# Patient Record
Sex: Male | Born: 1980 | Race: Black or African American | Hispanic: No | Marital: Single | Smoking: Never smoker
Health system: Southern US, Community
[De-identification: ages and names within clinical notes are randomized; demographics above are authoritative.]

## PROBLEM LIST (undated history)

## (undated) DIAGNOSIS — Z6841 Body Mass Index (BMI) 40.0 and over, adult: Secondary | ICD-10-CM

## (undated) DIAGNOSIS — F840 Autistic disorder: Secondary | ICD-10-CM

## (undated) DIAGNOSIS — R4701 Aphasia: Secondary | ICD-10-CM

## (undated) DIAGNOSIS — G4730 Sleep apnea, unspecified: Secondary | ICD-10-CM

## (undated) DIAGNOSIS — I10 Essential (primary) hypertension: Secondary | ICD-10-CM

## (undated) DIAGNOSIS — K0889 Other specified disorders of teeth and supporting structures: Secondary | ICD-10-CM

## (undated) HISTORY — DX: Autistic disorder: F84.0

## (undated) HISTORY — DX: Essential (primary) hypertension: I10

## (undated) HISTORY — PX: TOENAIL EXCISION: SHX183

---
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
## 2002-03-03 ENCOUNTER — Ambulatory Visit (HOSPITAL_BASED_OUTPATIENT_CLINIC_OR_DEPARTMENT_OTHER): Admission: RE | Admit: 2002-03-03 | Discharge: 2002-03-03 | Payer: Self-pay | Admitting: Oral Surgery

## 2002-03-03 ENCOUNTER — Encounter (INDEPENDENT_AMBULATORY_CARE_PROVIDER_SITE_OTHER): Payer: Self-pay | Admitting: Specialist

## 2002-03-03 HISTORY — PX: MULTIPLE TOOTH EXTRACTIONS: SHX2053

## 2002-03-03 HISTORY — PX: CYST EXCISION: SHX5701

## 2003-05-05 ENCOUNTER — Encounter: Payer: Self-pay | Admitting: Family Medicine

## 2003-05-05 ENCOUNTER — Ambulatory Visit (HOSPITAL_COMMUNITY): Admission: RE | Admit: 2003-05-05 | Discharge: 2003-05-05 | Payer: Self-pay | Admitting: Family Medicine

## 2004-07-27 ENCOUNTER — Ambulatory Visit: Payer: Self-pay | Admitting: Family Medicine

## 2005-02-01 ENCOUNTER — Ambulatory Visit: Payer: Self-pay | Admitting: Family Medicine

## 2005-05-22 ENCOUNTER — Ambulatory Visit: Payer: Self-pay | Admitting: Family Medicine

## 2006-01-28 ENCOUNTER — Ambulatory Visit: Payer: Self-pay | Admitting: Family Medicine

## 2006-04-09 ENCOUNTER — Ambulatory Visit: Payer: Self-pay | Admitting: Family Medicine

## 2006-05-29 ENCOUNTER — Ambulatory Visit: Payer: Self-pay | Admitting: Family Medicine

## 2006-12-16 ENCOUNTER — Ambulatory Visit: Payer: Self-pay | Admitting: Family Medicine

## 2006-12-27 ENCOUNTER — Encounter: Payer: Self-pay | Admitting: Family Medicine

## 2006-12-27 LAB — CONVERTED CEMR LAB
BUN: 15 mg/dL (ref 6–23)
Calcium: 9.2 mg/dL (ref 8.4–10.5)
Chloride: 103 meq/L (ref 96–112)
Cholesterol: 164 mg/dL (ref 0–200)
Creatinine, Ser: 0.92 mg/dL (ref 0.40–1.50)
Glucose, Bld: 74 mg/dL (ref 70–99)
HDL: 41 mg/dL (ref >39)
LDL Cholesterol: 106 mg/dL — ABNORMAL HIGH (ref 0–99)
TSH: 1.554 microintl units/mL (ref 0.350–5.50)
Total CHOL/HDL Ratio: 4
VLDL: 17 mg/dL (ref 0–40)

## 2007-05-07 ENCOUNTER — Ambulatory Visit: Payer: Self-pay | Admitting: Family Medicine

## 2007-08-07 ENCOUNTER — Encounter: Payer: Self-pay | Admitting: Family Medicine

## 2007-10-13 ENCOUNTER — Ambulatory Visit: Payer: Self-pay | Admitting: Family Medicine

## 2007-10-31 DIAGNOSIS — F79 Unspecified intellectual disabilities: Secondary | ICD-10-CM | POA: Insufficient documentation

## 2007-10-31 DIAGNOSIS — B351 Tinea unguium: Secondary | ICD-10-CM | POA: Insufficient documentation

## 2008-02-12 ENCOUNTER — Ambulatory Visit: Payer: Self-pay | Admitting: Family Medicine

## 2008-03-31 ENCOUNTER — Encounter: Payer: Self-pay | Admitting: Family Medicine

## 2008-04-13 ENCOUNTER — Encounter: Payer: Self-pay | Admitting: Family Medicine

## 2008-04-13 ENCOUNTER — Telehealth: Payer: Self-pay | Admitting: Family Medicine

## 2008-04-16 ENCOUNTER — Encounter: Payer: Self-pay | Admitting: Family Medicine

## 2008-04-16 LAB — CONVERTED CEMR LAB
Cholesterol: 146 mg/dL (ref 0–200)
Eosinophils Relative: 2 % (ref 0–5)
Lymphocytes Relative: 11 % — ABNORMAL LOW (ref 12–46)
Lymphs Abs: 1.4 10*3/uL (ref 0.7–4.0)
MCV: 92.2 fL (ref 78.0–100.0)
Monocytes Relative: 7 % (ref 3–12)
Neutro Abs: 10.5 10*3/uL — ABNORMAL HIGH (ref 1.7–7.7)
Platelets: 318 10*3/uL (ref 150–400)
RDW: 12.7 % (ref 11.5–15.5)
Total CHOL/HDL Ratio: 3.3
VLDL: 20 mg/dL (ref 0–40)
WBC: 13.1 10*3/uL — ABNORMAL HIGH (ref 4.0–10.5)

## 2008-06-03 ENCOUNTER — Ambulatory Visit: Payer: Self-pay | Admitting: Family Medicine

## 2008-08-23 ENCOUNTER — Ambulatory Visit: Payer: Self-pay | Admitting: Family Medicine

## 2008-08-23 ENCOUNTER — Ambulatory Visit (HOSPITAL_COMMUNITY): Admission: RE | Admit: 2008-08-23 | Discharge: 2008-08-23 | Payer: Self-pay | Admitting: Family Medicine

## 2008-09-20 ENCOUNTER — Encounter: Payer: Self-pay | Admitting: Family Medicine

## 2008-10-04 ENCOUNTER — Ambulatory Visit: Payer: Self-pay | Admitting: Family Medicine

## 2008-10-27 ENCOUNTER — Ambulatory Visit (HOSPITAL_COMMUNITY): Admission: RE | Admit: 2008-10-27 | Discharge: 2008-10-27 | Payer: Self-pay | Admitting: Family Medicine

## 2008-10-27 ENCOUNTER — Ambulatory Visit: Payer: Self-pay | Admitting: Family Medicine

## 2008-11-16 ENCOUNTER — Encounter: Payer: Self-pay | Admitting: Family Medicine

## 2008-12-27 ENCOUNTER — Ambulatory Visit: Payer: Self-pay | Admitting: Family Medicine

## 2008-12-27 DIAGNOSIS — B350 Tinea barbae and tinea capitis: Secondary | ICD-10-CM | POA: Insufficient documentation

## 2009-04-28 ENCOUNTER — Encounter: Payer: Self-pay | Admitting: Family Medicine

## 2009-04-28 LAB — CONVERTED CEMR LAB
BUN: 13 mg/dL (ref 6–23)
Basophils Absolute: 0.1 10*3/uL (ref 0.0–0.1)
Chloride: 105 meq/L (ref 96–112)
Cholesterol: 155 mg/dL (ref 0–200)
Eosinophils Absolute: 0.5 10*3/uL (ref 0.0–0.7)
Eosinophils Relative: 4 % (ref 0–5)
Glucose, Bld: 83 mg/dL (ref 70–99)
HCT: 45.6 % (ref 39.0–52.0)
HDL: 39 mg/dL — ABNORMAL LOW (ref >39)
LDL Cholesterol: 98 mg/dL (ref 0–99)
Lymphocytes Relative: 10 % — ABNORMAL LOW (ref 12–46)
Lymphs Abs: 1.2 10*3/uL (ref 0.7–4.0)
MCHC: 33.1 g/dL (ref 30.0–36.0)
Neutro Abs: 8.8 10*3/uL — ABNORMAL HIGH (ref 1.7–7.7)
Neutrophils Relative %: 79 % — ABNORMAL HIGH (ref 43–77)
Platelets: 285 10*3/uL (ref 150–400)
VLDL: 18 mg/dL (ref 0–40)

## 2009-05-02 ENCOUNTER — Ambulatory Visit: Payer: Self-pay | Admitting: Family Medicine

## 2009-09-03 IMAGING — CR DG CHEST 2V
2 series · 2 of 2 positions shown · non-contrast
Comparison: None available.

CLINICAL DATA: Acute bronchitis.

CHEST - 2 VIEW

[view not recorded (1 of 2)]
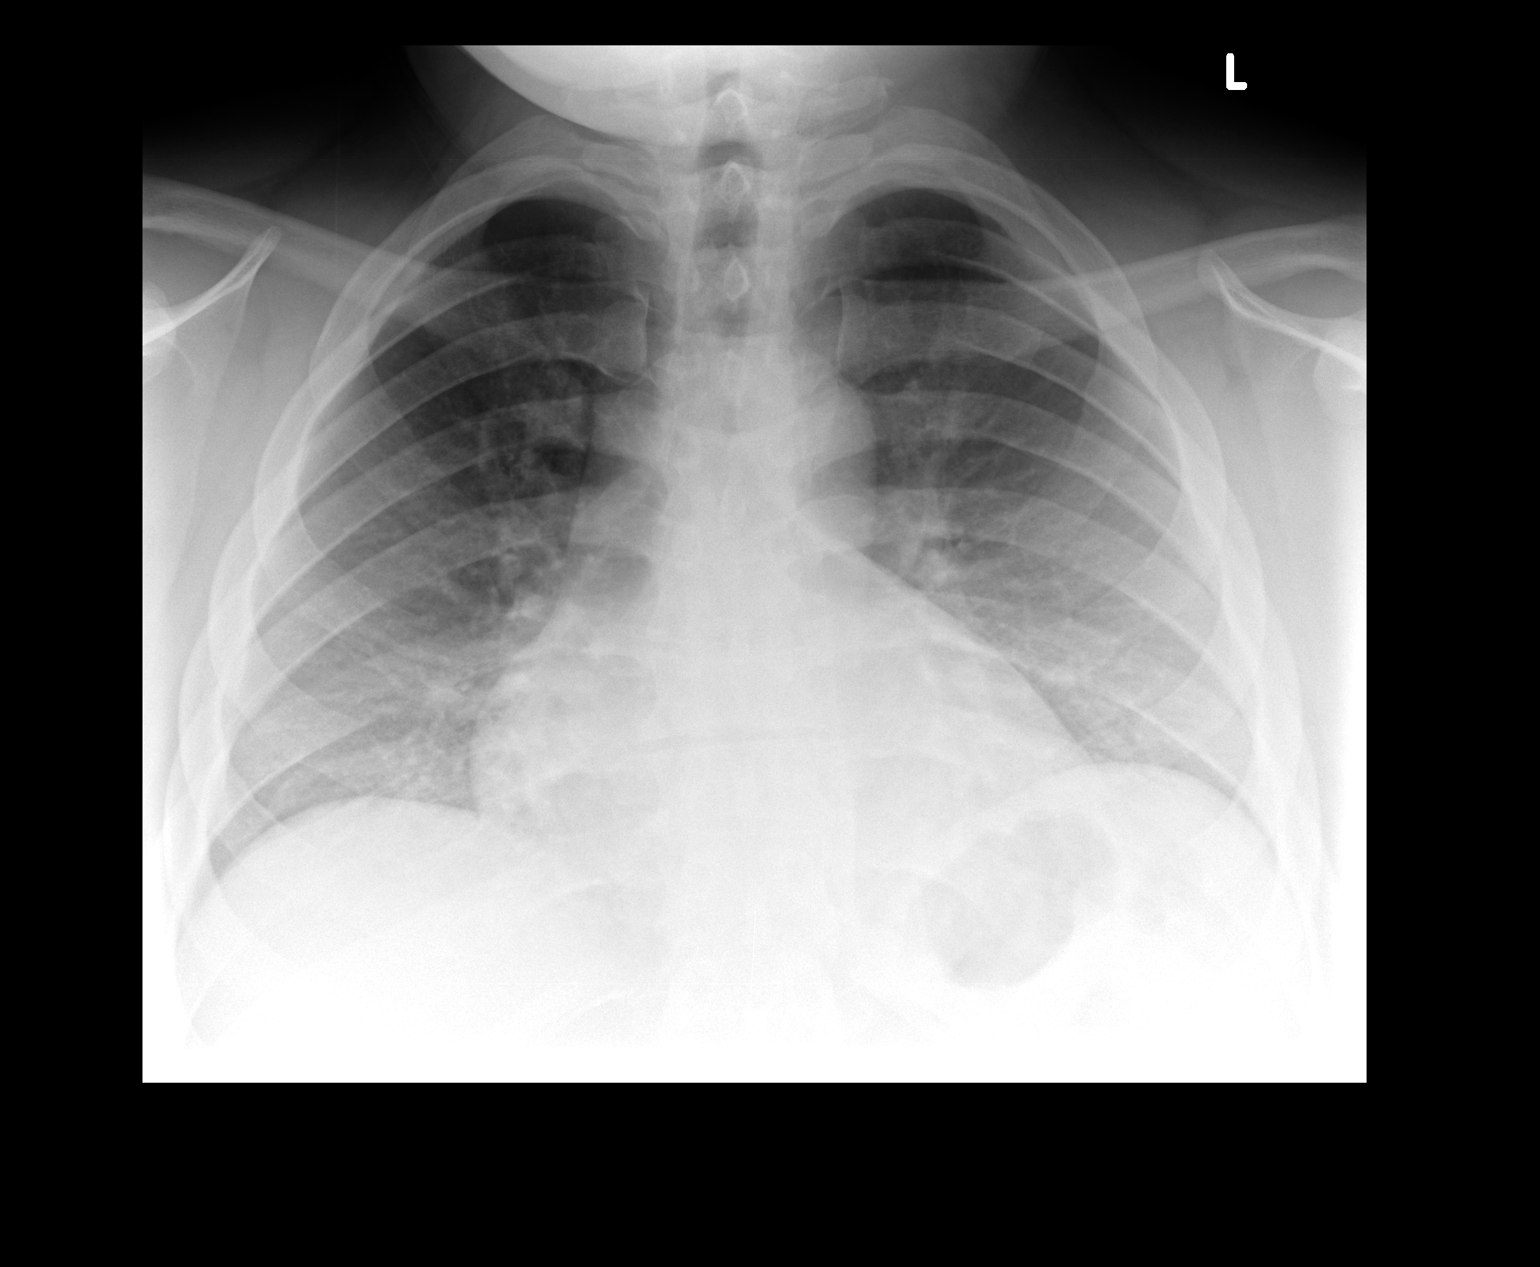

[view not recorded (2 of 2)]
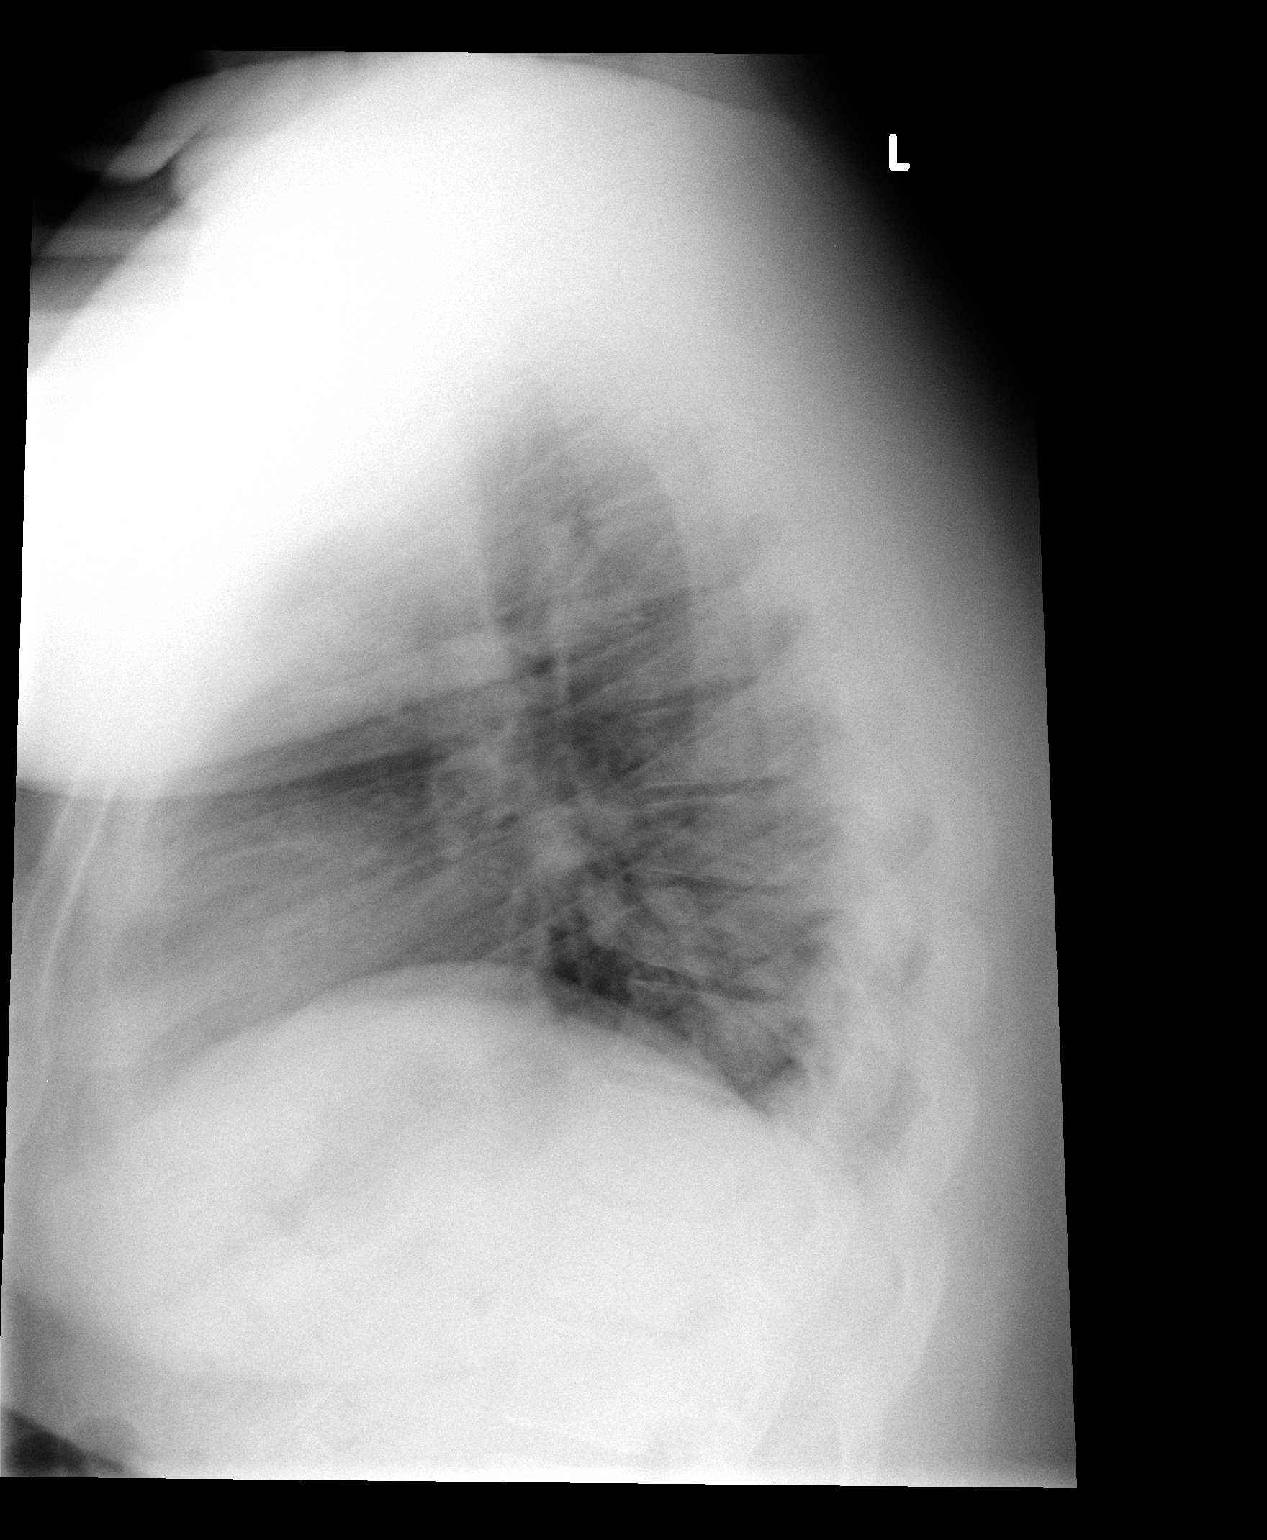

[2 of 2 positions shown; findings below may reference images not displayed]

FINDINGS: Lung volumes are low with some crowding of the
bronchovascular structures.  There is no focal airspace disease.
Heart size is normal.  No pleural effusion.
IMPRESSION: No acute finding with low lung volumes noted.

## 2009-10-11 ENCOUNTER — Ambulatory Visit: Payer: Self-pay | Admitting: Family Medicine

## 2009-10-11 DIAGNOSIS — I10 Essential (primary) hypertension: Secondary | ICD-10-CM | POA: Insufficient documentation

## 2009-11-16 LAB — CONVERTED CEMR LAB
BUN: 21 mg/dL (ref 6–23)
Basophils Absolute: 0.1 10*3/uL (ref 0.0–0.1)
Basophils Relative: 1 % (ref 0–1)
Chloride: 101 meq/L (ref 96–112)
Creatinine, Ser: 0.8 mg/dL (ref 0.40–1.50)
Eosinophils Absolute: 0.2 10*3/uL (ref 0.0–0.7)
Eosinophils Relative: 2 % (ref 0–5)
Hemoglobin: 15.4 g/dL (ref 13.0–17.0)
Lymphocytes Relative: 15 % (ref 12–46)
Neutro Abs: 7.5 10*3/uL (ref 1.7–7.7)
Neutrophils Relative %: 76 % (ref 43–77)
Potassium: 4.1 meq/L (ref 3.5–5.3)
Sodium: 142 meq/L (ref 135–145)
WBC: 9.8 10*3/uL (ref 4.0–10.5)

## 2009-11-18 ENCOUNTER — Telehealth: Payer: Self-pay | Admitting: Family Medicine

## 2009-11-18 ENCOUNTER — Ambulatory Visit (HOSPITAL_COMMUNITY): Admission: RE | Admit: 2009-11-18 | Discharge: 2009-11-18 | Payer: Self-pay | Admitting: Family Medicine

## 2009-11-18 ENCOUNTER — Ambulatory Visit: Payer: Self-pay | Admitting: Family Medicine

## 2009-11-18 DIAGNOSIS — J302 Other seasonal allergic rhinitis: Secondary | ICD-10-CM | POA: Insufficient documentation

## 2009-11-20 DIAGNOSIS — J209 Acute bronchitis, unspecified: Secondary | ICD-10-CM

## 2010-04-17 ENCOUNTER — Ambulatory Visit: Payer: Self-pay | Admitting: Family Medicine

## 2010-04-17 DIAGNOSIS — R5381 Other malaise: Secondary | ICD-10-CM

## 2010-04-17 DIAGNOSIS — R5383 Other fatigue: Secondary | ICD-10-CM

## 2010-05-25 LAB — CONVERTED CEMR LAB
Chloride: 107 meq/L (ref 96–112)
Creatinine, Ser: 0.9 mg/dL (ref 0.40–1.50)
HDL: 34 mg/dL — ABNORMAL LOW (ref >39)
LDL Cholesterol: 73 mg/dL (ref 0–99)
Potassium: 4.4 meq/L (ref 3.5–5.3)
Sodium: 143 meq/L (ref 135–145)
Triglycerides: 168 mg/dL — ABNORMAL HIGH (ref <150)

## 2010-08-17 ENCOUNTER — Ambulatory Visit
Admission: RE | Admit: 2010-08-17 | Discharge: 2010-08-17 | Payer: Self-pay | Source: Home / Self Care | Attending: Family Medicine | Admitting: Family Medicine

## 2010-08-17 DIAGNOSIS — R7303 Prediabetes: Secondary | ICD-10-CM | POA: Insufficient documentation

## 2010-08-17 DIAGNOSIS — E739 Lactose intolerance, unspecified: Secondary | ICD-10-CM | POA: Insufficient documentation

## 2010-08-17 DIAGNOSIS — R7301 Impaired fasting glucose: Secondary | ICD-10-CM | POA: Insufficient documentation

## 2010-08-18 LAB — CONVERTED CEMR LAB
BUN: 12 mg/dL (ref 6–23)
Glucose, Bld: 85 mg/dL (ref 70–99)
Hgb A1c MFr Bld: 5.6 % (ref <5.7)
Potassium: 4.1 meq/L (ref 3.5–5.3)

## 2010-09-05 NOTE — Letter (Signed)
Summary: xray  xray   Imported By: Curtis Sites 01/25/2010 11:41:12  _____________________________________________________________________  External Attachment:    Type:   Image     Comment:   External Document

## 2010-09-05 NOTE — Assessment & Plan Note (Signed)
Summary: office visit   Vital Signs:  Patient profile:   30 year old male Height:      72 inches Weight:      360.50 pounds BMI:     49.07 O2 Sat:      98 % Pulse rate:   112 / minute Pulse rhythm:   regular Resp:     16 per minute BP sitting:   140 / 90  (left arm) Cuff size:   xl  Vitals Entered By: Everitt Amber LPN (October 11, 9145 9:35 AM)  Nutrition Counseling: Patient's BMI is greater than 25 and therefore counseled on weight management options. CC: Follow up chronic problems, had a cold and still has a little dry cough   CC:  Follow up chronic problems and had a cold and still has a little dry cough.  History of Present Illness: 2 week h/o head and chest congestion with cough , fatigue and reduced apetite in the past 3 days.  Pt noted to be picking his fingers.excessively with resultant skin breakdown. HeaLSO IS EARTING WITH NO RESTRAINT AND UNFORTUNATELY CONTINUES TO GAIN WEIGHT. He has no h/o excessive nasal drainage , sneezing or sore throat.   Current Medications (verified): 1)  Singulair 10 Mg Tabs (Montelukast Sodium) .... One Tab By Mouth Qd  Allergies (verified): No Known Drug Allergies  Review of Systems      See HPI Eyes:  Denies blurring and red eye. CV:  Denies difficulty breathing while lying down, shortness of breath with exertion, and swelling of feet. GI:  Denies abdominal pain, constipation, diarrhea, nausea, and vomiting. GU:  Denies incontinence, urinary frequency, and urinary hesitancy. MS:  Denies joint pain and stiffness. Heme:  Denies abnormal bruising and bleeding. Allergy:  Complains of seasonal allergies.  Physical Exam  General:  alert, well-hydrated, morbidly obese HEENT: No facial asymmetry,  EOMI, No sinus tenderness, TM's Clear, oropharynx  pink and moist. .  Chest:decreased air entry with bilateral crackles. and a few wheezes  CVS: S1, S2, No murmurs, No S3.   Abd: Soft, Nontender.  MS: Adequate ROM spine, hips, shoulders  and knees.  Ext: No edema.   CNS: CN 2-12 intact, power tone and sensation normal throughout.   Skin: Intact, no visible lesions or rashes.  Psych: Good eye contact, normal affect.        Impression & Recommendations:  Problem # 1:  HYPERTENSION (ICD-401.9) Assessment [name]  His updated medication list for this problem includes:    Hydrochlorothiazide 25 Mg Tabs (Hydrochlorothiazide) .Marland Kitchen... Take 1 tablet by mouth once a day  Orders: T-Basic Metabolic Panel [phone])  BP today: 140/90 Prior BP: 120/80 (05/02/2009)  Labs Reviewed: K+: 4.1 (04/28/2009) Creat: : 0.92 (04/28/2009)   Chol: 155 (04/28/2009)   HDL: 39 (04/28/2009)   LDL: 98 (04/28/2009)   TG: 91 (04/28/2009)  Problem # 2:  ACUTE BRONCHITIS (ICD-466.0) Assessment: Comment Only  The following medications were removed from the medication list:    Penicillin V Potassium 500 Mg Tabs (Penicillin v potassium) .Marland Kitchen... Take 1 tablet by mouth three times a day    Tessalon Perles 100 Mg Caps (Benzonatate) .Marland Kitchen... Take 1 capsule by mouth three times a day His updated medication list for this problem includes:    Singulair 10 Mg Tabs (Montelukast sodium) ..... One tab by mouth qd    Septra Ds 800-160 Mg Tabs (Sulfamethoxazole-trimethoprim) .Marland Kitchen... Take 1 tablet by mouth two times a day    Tessalon Perles 100 Mg Caps (Benzonatate) .Marland KitchenMarland KitchenMarland KitchenMarland Kitchen  Take 1 capsule by mouth three times a day  Problem # 3:  OBESITY, UNSPECIFIED (ICD-278.00) Assessment: [name]  Ht: 72 (10/11/2009)   Wt: 360.50 (10/11/2009)   BMI: 49.07 (10/11/2009)  Complete Medication List: 1)  Singulair 10 Mg Tabs (Montelukast sodium) .... One tab by mouth qd 2)  Septra Ds 800-160 Mg Tabs (Sulfamethoxazole-trimethoprim) .... Take 1 tablet by mouth two times a day 3)  Tessalon Perles 100 Mg Caps (Benzonatate) .... Take 1 capsule by mouth three times a day 4)  Hydrochlorothiazide 25 Mg Tabs (Hydrochlorothiazide) .... Take 1 tablet by mouth once a day 5)  Klor-con  M20 20 Meq Cr-tabs (Potassium chloride crys cr) .... Take 1 tablet by mouth once a day  Other Orders: T-CBC w/Diff ([id number])  Patient Instructions: 1)  Please schedule a follow-up appointment in  5 to 6 weeks. 2)  You are being treated for bronchitis an meds are sent in. 3)  You will need to start medication for blood p[ressure again. 4)  It is extremely impt that you eat more fruit andveg, drink only water or crystal light, and eatsmaller food portions. Prescriptions: KLOR-CON M20 20 MEQ CR-TABS (POTASSIUM CHLORIDE CRYS CR) Take 1 tablet by mouth once a day  #30 x 2   Entered and Authorized by:   Syliva Overman MD   Signed by:   Syliva Overman MD on 10/11/2009   Method used:   Electronically to        [facility] (retail)       [address] [place], [place]  [address]       Ph: [phone]       Fax: [phone]   RxID:   [id number] HYDROCHLOROTHIAZIDE 25 MG TABS (HYDROCHLOROTHIAZIDE) Take 1 tablet by mouth once a day  #30 x 2   Entered and Authorized by:   Syliva Overman MD   Signed by:   Syliva Overman MD on 10/11/2009   Method used:   Electronically to        [facility] (retail)       [address]       [place], [place]  [address]       Ph: [phone]       Fax: [phone]   RxID:   [id number] TESSALON PERLES 100 MG CAPS (BENZONATATE) Take 1 capsule by mouth three times a day  #30 x 0   Entered and Authorized by:   Syliva Overman MD   Signed by:   Syliva Overman MD on 10/11/2009   Method used:   Electronically to        [facility] (retail)       [address]       [place], [place]  [address]       Ph: [phone]       Fax: [phone]   RxID:   [id number] SEPTRA DS 800-160 MG TABS (SULFAMETHOXAZOLE-TRIMETHOPRIM) Take 1 tablet by mouth two times a day  #20 x 0   Entered and Authorized by:   Syliva Overman MD    Signed by:   Syliva Overman MD on 10/11/2009   Method used:   Electronically to        [facility] (retail)       [address]  [place], [place]  [address]       Ph: [phone]       Fax: [phone]   RxID:   [id number]

## 2010-09-05 NOTE — Letter (Signed)
Summary: demographic  demographic   Imported By: Curtis Sites 01/25/2010 11:39:10  _____________________________________________________________________  External Attachment:    Type:   Image     Comment:   External Document

## 2010-09-05 NOTE — Letter (Signed)
Summary: phone  phone   Imported By: Curtis Sites 01/25/2010 11:40:14  _____________________________________________________________________  External Attachment:    Type:   Image     Comment:   External Document

## 2010-09-05 NOTE — Assessment & Plan Note (Signed)
Summary: F UP   Vital Signs:  Patient profile:   30 year old male Height:      72 inches Weight:      356 pounds BMI:     48.46 O2 Sat:      95 % Pulse rate:   101 / minute Pulse rhythm:   regular Resp:     16 per minute BP sitting:   120 / 84  (right arm)  Vitals Entered By: Everitt Amber LPN (November 18, 2009 8:57 AM)  Nutrition Counseling: Patient's BMI is greater than 25 and therefore counseled on weight management options. CC: Follow up chronic problems, still has some sinus drainage, clear   CC:  Follow up chronic problems, still has some sinus drainage, and clear.  History of Present Illness: 5 day h/o head and chest songestion with increased clear nasal drainage ,and cough. There is no h/o fever or chills. The pt's leve;l of activity as well as his apetite are normal. He has recently completed an antibiotic course.  Current Medications (verified): 1)  Singulair 10 Mg Tabs (Montelukast Sodium) .... One Tab By Mouth Qd 2)  Hydrochlorothiazide 25 Mg Tabs (Hydrochlorothiazide) .... Take 1 Tablet By Mouth Once A Day 3)  Klor-Con M20 20 Meq Cr-Tabs (Potassium Chloride Crys Cr) .... Take 1 Tablet By Mouth Once A Day  Allergies (verified): No Known Drug Allergies  Review of Systems      See HPI General:  Denies chills and fever. Eyes:  Denies discharge, eye pain, and red eye. ENT:  Denies hoarseness, nasal congestion, sinus pressure, and sore throat. CV:  Denies chest pain or discomfort, palpitations, and swelling of feet. Resp:  Complains of cough and wheezing; denies coughing up blood and sputum productive. GI:  Denies abdominal pain, constipation, diarrhea, nausea, and vomiting. GU:  Denies dysuria, urinary frequency, and urinary hesitancy. MS:  Denies joint pain and stiffness. Neuro:  Complains of poor balance; denies headaches, seizures, and sensation of room spinning. Psych:  Denies anxiety and depression. Endo:  Denies cold intolerance, excessive hunger, excessive  thirst, excessive urination, heat intolerance, polyuria, and weight change. Heme:  Denies abnormal bruising and bleeding. Allergy:  Complains of seasonal allergies; increased symptoms in the past 3 weeks.  Physical Exam  General:  alert, well-hydrated, morbidly obese HEENT: No facial asymmetry,  EOMI, No sinus tenderness, TM's Clear, oropharynx  pink and moist. Nasal mucosa erythematous and edematous..  Chest:decreased air entry with  a few wheezes  CVS: S1, S2, No murmurs, No S3.   Abd: Soft, Nontender.  MS: Adequate ROM spine, hips, shoulders and knees.  Ext: No edema.   CNS: CN 2-12 intact, power tone and sensation normal throughout.   Skin: Intact, no visible lesions or rashes.  Psych: Good eye contact, normal affect.        Impression & Recommendations:  Problem # 1:  ALLERGIC RHINITIS CAUSE UNSPECIFIED (ICD-477.9) Assessment [name]  His updated medication list for this problem includes:    Flonase 50 Mcg/act Susp (Fluticasone propionate) ..... One to two puffs per nostriil daily, as needed  Orders: Depo- Medrol 80mg  (J1040) Admin of Therapeutic Inj  intramuscular or subcutaneous ([address])  Problem # 2:  HYPERTENSION (ICD-401.9) Assessment: Improved  His updated medication list for this problem includes:    Hydrochlorothiazide 25 Mg Tabs (Hydrochlorothiazide) .Marland Kitchen... Take 1 tablet by mouth once a day  BP today: 120/84 Prior BP: 140/90 (10/11/2009)  Labs Reviewed: K+: 4.1 (11/16/2009) Creat: : 0.80 (11/16/2009)   Chol: 155 (  04/28/2009)   HDL: 39 (04/28/2009)   LDL: 98 (04/28/2009)   TG: 91 (04/28/2009)  Problem # 3:  OBESITY, UNSPECIFIED (ICD-278.00) Assessment: Improved  Ht: 72 (11/18/2009)   Wt: 356 (11/18/2009)   BMI: 48.46 (11/18/2009)  Problem # 4:  ACUTE BRONCHITIS (ICD-466.0) Assessment: Comment Only  The following medications were removed from the medication list:    Septra Ds 800-160 Mg Tabs (Sulfamethoxazole-trimethoprim) .Marland Kitchen... Take 1 tablet by  mouth two times a day    Tessalon Perles 100 Mg Caps (Benzonatate) .Marland Kitchen... Take 1 capsule by mouth three times a day His updated medication list for this problem includes:    Singulair 10 Mg Tabs (Montelukast sodium) ..... One tab by mouth qd    Tessalon Perles 100 Mg Caps (Benzonatate) .Marland Kitchen... Take 1 capsule by mouth three times a day  Orders: CXR- 2view (CXR) Albuterol Sulfate Sol 1mg  unit dose (Z3086) Atrovent 1mg  (Neb) (V7846) Nebulizer Tx ([address])  Complete Medication List: 1)  Singulair 10 Mg Tabs (Montelukast sodium) .... One tab by mouth qd 2)  Hydrochlorothiazide 25 Mg Tabs (Hydrochlorothiazide) .... Take 1 tablet by mouth once a day 3)  Klor-con M20 20 Meq Cr-tabs (Potassium chloride crys cr) .... Take 1 tablet by mouth once a day 4)  Flonase 50 Mcg/act Susp (Fluticasone propionate) .... One to two puffs per nostriil daily, as needed 5)  Tessalon Perles 100 Mg Caps (Benzonatate) .... Take 1 capsule by mouth three times a day 6)  Prednisone (pak) 5 Mg Tabs (Prednisone) .... Use as directed  Patient Instructions: 1)  Please schedule a follow-up appointment in 4 months. 2)  You need to lose weight. Consider a lower calorie diet and regular exercise.  3)  It is important that you exercise regularly at least 20 minutes 5 times a week. If you develop chest pain, have severe difficulty breathing, or feel very tired , stop exercising immediately and seek medical attention. 4)  You are being treated  for uncontrolled allergies, you will ge an injection in the office, and meds are sent in also. 5)  You need a CXR today Prescriptions: PREDNISONE (PAK) 5 MG TABS (PREDNISONE) Use as directed  #21 x 0   Entered and Authorized by:   Syliva Overman MD   Signed by:   Syliva Overman MD on 11/18/2009   Method used:   Electronically to        [facility] (retail)       [address]       [place], [place]  [address]       Ph: [phone]       Fax:  [phone]   RxID:   [id number] TESSALON PERLES 100 MG CAPS (BENZONATATE) Take 1 capsule by mouth three times a day  #30 x 0   Entered and Authorized by:   Syliva Overman MD   Signed by:   Syliva Overman MD on 11/18/2009   Method used:   Electronically to        [facility] (retail)       [address]       [place], [place]  [address]       Ph: [phone]       Fax: [phone]   RxID:   [id number] FLONASE 50 MCG/ACT SUSP (FLUTICASONE PROPIONATE) one to two puffs per nostriil daily, as needed  #1 x 3   Entered and  Authorized by:   Syliva Overman MD   Signed by:   Syliva Overman MD on 11/18/2009   Method used:   Electronically to        [facility] (retail)       [address]       [place], [place]  [address]       Ph: [phone]       Fax: [phone]   RxID:   [id number]    Medication Administration  Injection # 1:    Medication: Depo- Medrol 80mg     Diagnosis: ALLERGIC RHINITIS CAUSE UNSPECIFIED (ICD-477.9)    Route: IM    Site: RUOQ gluteus    Exp Date: 06/2010    Lot #: [id number]    Mfr: Pharmacia    Comments: 80mg  given     Patient tolerated injection without complications    Given by: Everitt Amber LPN (November 18, 2009 11:45 AM)  Medication # 1:    Medication: Albuterol Sulfate Sol 1mg  unit dose    Diagnosis: ACUTE BRONCHITIS (ICD-466.0)    Dose: 2.5/16ml    Route: inhaled    Exp Date: 12/11    Lot #: [id number]    Mfr: nephron    Patient tolerated medication without complications    Given by: Everitt Amber LPN (November 18, 2009 11:50 AM)  Medication # 2:    Medication: Atrovent 1mg  (Neb)    Diagnosis: ACUTE BRONCHITIS (ICD-466.0)    Dose: 0.5/2.64ml    Route: inhaled    Exp Date: 5/11    Lot #: [id number]    Mfr: nephron    Patient tolerated medication without complications    Given by: Everitt Amber LPN (November 18, 2009 11:51 AM)  Orders Added: 1)  Est. Patient Level IV  [88416] 2)  CXR- 2view [CXR] 3)  Depo- Medrol 80mg  [J1040] 4)  Admin of Therapeutic Inj  intramuscular or subcutaneous [[address]] 5)  Albuterol Sulfate Sol 1mg  unit dose [J7613] 6)  Atrovent 1mg  (Neb) [S0630] 7)  Nebulizer Tx [[address]]

## 2010-09-05 NOTE — Letter (Signed)
Summary: misc.  misc.   Imported By: Curtis Sites 01/25/2010 11:39:59  _____________________________________________________________________  External Attachment:    Type:   Image     Comment:   External Document

## 2010-09-05 NOTE — Letter (Signed)
Summary: history and physical  history and physical   Imported By: Curtis Sites 01/25/2010 11:39:29  _____________________________________________________________________  External Attachment:    Type:   Image     Comment:   External Document

## 2010-09-05 NOTE — Assessment & Plan Note (Signed)
Summary: office visit   Vital Signs:  Patient profile:   30 year old male Height:      72 inches Weight:      354 pounds BMI:     48.18 O2 Sat:      92 % Pulse rate:   96 / minute Pulse rhythm:   regular Resp:     16 per minute BP sitting:   130 / 90 Cuff size:   xl  Vitals Entered By: Everitt Amber LPN (April 17, 2010 1:07 PM)  Nutrition Counseling: Patient's BMI is greater than 25 and therefore counseled on weight management options. CC: Follow up chronic problems   CC:  Follow up chronic problems.  History of Present Illness: Mother reports  that he has been  doing well. Denies recent fever or chills. Denies sinus pressure, nasal congestion , ear pain or sore throat. Denies chest congestion, or cough productive of sputum. Denies chest pain, palpitations, PND, orthopnea or leg swelling. Denies abdominal pain, nausea, vomitting, diarrhea or constipation. Denies change in bowel movements or bloody stool. Denies dysuria , frequency, incontinence or hesitancy. Denies  joint pain, swelling, or reduced mobility. Denies headaches, vertigo, seizures. Denies depression, anxiety or insomnia. Denies  rash, lesions, or itch. pt continues to snack at night, but has actually lost weight , which is excellent.     Current Medications (verified): 1)  Singulair 10 Mg Tabs (Montelukast Sodium) .... One Tab By Mouth Qd 2)  Hydrochlorothiazide 25 Mg Tabs (Hydrochlorothiazide) .... Take 1 Tablet By Mouth Once A Day 3)  Klor-Con M20 20 Meq Cr-Tabs (Potassium Chloride Crys Cr) .... Take 1 Tablet By Mouth Once A Day 4)  Flonase 50 Mcg/act Susp (Fluticasone Propionate) .... One To Two Puffs Per Nostriil Daily, As Needed  Allergies (verified): No Known Drug Allergies  Review of Systems      See HPI Eyes:  Denies discharge, eye pain, and red eye. Endo:  Denies excessive thirst and excessive urination. Heme:  Denies abnormal bruising and bleeding. Allergy:  Denies hives or rash and  itching eyes.  Physical Exam  General:  alert, well-hydrated, morbidly obese HEENT: No facial asymmetry,  EOMI, No sinus tenderness, TM's Clear, oropharynx  pink and moist. Nasal mucosa not erythematous oredematous..  Chest:dclear to ascultation CVS: S1, S2, No murmurs, No S3.   Abd: Soft, Nontender.  MS: Adequate ROM spine, hips, shoulders and knees.  Ext: No edema.   CNS: CN 2-12 intact, power tone and sensation normal throughout.   Skin: Intact, no visible lesions or rashes.  Psych: Good eye contact, normal affect.        Impression & Recommendations:  Problem # 1:  OBESITY, UNSPECIFIED (ICD-278.00) Assessment Improved  Orders: T- Hemoglobin A1C ([id number])  Ht: 72 (04/17/2010)   Wt: 354 (04/17/2010)   BMI: 48.18 (04/17/2010) therapeutic lifestyle change discussed and encouraged  Problem # 2:  ALLERGIC RHINITIS CAUSE UNSPECIFIED (ICD-477.9) Assessment: Improved  His updated medication list for this problem includes:    Flonase 50 Mcg/act Susp (Fluticasone propionate) ..... One to two puffs per nostriil daily, as needed  Problem # 3:  HYPERTENSION (ICD-401.9) Assessment: [name]  His updated medication list for this problem includes:    Hydrochlorothiazide 25 Mg Tabs (Hydrochlorothiazide) .Marland Kitchen... Take 1 tablet by mouth once a day Check your Blood Pressure regularly. If it is above150/95  you should make an appointment.   BP today: 130/90 Prior BP: 120/84 (11/18/2009)  Labs Reviewed: K+: 4.1 (11/16/2009) Creat: : 0.80 (11/16/2009)  Chol: 155 (04/28/2009)   HDL: 39 (04/28/2009)   LDL: 98 (04/28/2009)   TG: 91 (04/28/2009)  Complete Medication List: 1)  Singulair 10 Mg Tabs (Montelukast sodium) .... One tab by mouth qd 2)  Hydrochlorothiazide 25 Mg Tabs (Hydrochlorothiazide) .... Take 1 tablet by mouth once a day 3)  Klor-con M20 20 Meq Cr-tabs (Potassium chloride crys cr) .... Take 1 tablet by mouth once a day 4)  Flonase 50 Mcg/act Susp (Fluticasone  propionate) .... One to two puffs per nostriil daily, as needed  Other Orders: Influenza Vaccine MCR [id number]) T-Lipid Profile [id number]) T-TSH [id number]) T-Basic Metabolic Panel [phone])  Patient Instructions: 1)  Please schedule a follow-up appointment in 4 months. 2)  It is important that you exercise regularly at least 30 minutes 5 times a week. If you develop chest pain, have severe difficulty breathing, or feel very tired , stop exercising immediately and seek medical attention. 3)  You need to lose weight. Consider a lower calorie diet and regular exercise.  4)  Lipid Panel prior to visit, ICD-9: 5)  TSH prior to visit, ICD-9:  fasting asap 6)  HbgA1C prior to visit, ICD-9: Prescriptions: KLOR-CON M20 20 MEQ CR-TABS (POTASSIUM CHLORIDE CRYS CR) Take 1 tablet by mouth once a day  #30 x 3   Entered by:   Everitt Amber LPN   Authorized by:   Syliva Overman MD   Signed by:   Everitt Amber LPN on 54/27/0623   Method used:   Electronically to        [facility] (retail)       [address] [place], [place]  [address]       Ph: [phone]       Fax: [phone]   RxID:   [id number] HYDROCHLOROTHIAZIDE 25 MG TABS (HYDROCHLOROTHIAZIDE) Take 1 tablet by mouth once a day  #30 x 3   Entered by:   Everitt Amber LPN   Authorized by:   Syliva Overman MD   Signed by:   Everitt Amber LPN on 93/71/6967   Method used:   Electronically to        [facility] (retail)       [address] [place], [place]  [address]       Ph: [phone]       Fax: [phone]   RxID:   [id number] SINGULAIR 10 MG TABS (MONTELUKAST SODIUM) ONE TAB by mouth QD  #30 Each x 3   Entered by:   Everitt Amber LPN   Authorized by:   Syliva Overman MD   Signed by:   Everitt Amber LPN on 50/93/2671   Method used:   Electronically to        [facility] (retail)       [address]  [place], [place]  [address]       Ph: [phone]       Fax: [phone]   RxID:   [id number]    Influenza Vaccine    Vaccine Type: Fluvax MCR    Site: right deltoid    Mfr: novartis     Dose: 0.5 ml    Route: IM    Given by: Everitt Amber LPN    Exp. Date: 12/2010  Lot #: [id number]

## 2010-09-05 NOTE — Progress Notes (Signed)
Summary: refill  Phone Note Call from Patient   Summary of Call: needs a refill on singular. [facility] [phone] Initial call taken by: Rudene Anda,  November 18, 2009 4:09 PM  Follow-up for Phone Call        Rx Called In Follow-up by: Adella Hare LPN,  November 18, 2009 4:22 PM    Prescriptions: SINGULAIR 10 MG TABS (MONTELUKAST SODIUM) ONE TAB by mouth QD  #30 x 5   Entered by:   Adella Hare LPN   Authorized by:   Syliva Overman MD   Signed by:   Adella Hare LPN on 45/40/9811   Method used:   Electronically to        [facility] (retail)       [address] [place], [place]  [address]       Ph: [phone]       Fax: [phone]   RxID:   [id number]

## 2010-09-05 NOTE — Letter (Signed)
Summary: labs  labs   Imported By: Curtis Sites 01/25/2010 11:39:44  _____________________________________________________________________  External Attachment:    Type:   Image     Comment:   External Document

## 2010-09-05 NOTE — Letter (Signed)
Summary: progress notes  progress notes   Imported By: Curtis Sites 01/25/2010 11:40:35  _____________________________________________________________________  External Attachment:    Type:   Image     Comment:   External Document

## 2010-09-05 NOTE — Letter (Signed)
Summary: consults  consults   Imported By: Curtis Sites 01/25/2010 11:38:18  _____________________________________________________________________  External Attachment:    Type:   Image     Comment:   External Document

## 2010-09-07 NOTE — Assessment & Plan Note (Signed)
Summary: office visit   Vital Signs:  Patient profile:   30 year old male Height:      72 inches Weight:      352.25 pounds BMI:     47.95 O2 Sat:      93 % on Room air Pulse rate:   91 / minute Pulse rhythm:   regular Resp:     16 per minute BP sitting:   130 / 90  (left arm) Cuff size:   xl   Vitals Entered By: Everitt Amber LPN (August 17, 2010 8:42 AM)  Nutrition Counseling: Patient's BMI is greater than 25 and therefore counseled on weight management options.  O2 Flow:  Room air CC: Follow up chronic problems   CC:  Follow up chronic problems.  History of Present Illness: mother reports  thathe has been doing well. Denies recent fever or chills. Denies sinus pressure, nasal congestion , ear pain or sore throat. Denies chest congestion, or cough productive of sputum. Denies chest pain, palpitations, PND, orthopnea or leg swelling. Denies abdominal pain, nausea, vomitting, diarrhea or constipation. Denies change in bowel movements or bloody stool. Denies dysuria , frequency, incontinence or hesitancy. Denies  joint pain, swelling, or reduced mobility. Denies headaches, vertigo, seizures. Denies depression, anxiety or insomnia. Denies  rash, lesions, or itch. Pt unable to provide history as he has mentalretardation with expressive aphasia     Current Medications (verified): 1)  Singulair 10 Mg Tabs (Montelukast Sodium) .... One Tab By Mouth Qd 2)  Hydrochlorothiazide 25 Mg Tabs (Hydrochlorothiazide) .... Take 1 Tablet By Mouth Once A Day 3)  Klor-Con M20 20 Meq Cr-Tabs (Potassium Chloride Crys Cr) .... Take 1 Tablet By Mouth Once A Day  Allergies (verified): No Known Drug Allergies  Review of Systems      See HPI General:  Complains of fatigue. Eyes:  Denies blurring and discharge. Endo:  Denies cold intolerance, excessive hunger, excessive thirst, and excessive urination. Heme:  Denies abnormal bruising and bleeding. Allergy:  Complains of seasonal  allergies.  Physical Exam  General:  alert, well-hydrated, morbidly obese HEENT: No facial asymmetry,  EOMI, No sinus tenderness, TM's Clear, oropharynx  pink and moist. Nasal mucosa not erythematous or edematous..  Chest:dclear to ascultation CVS: S1, S2, No murmurs, No S3.   Abd: Soft, Nontender.  MS: Adequate ROM spine, hips, shoulders and knees.  Ext: No edema.   CNS: CN 2-12 intact, power tone and sensation normal throughout.   Skin: Intact, no visible lesions or rashes.  Psych: Good eye contact, normal affect.        Impression & Recommendations:  Problem # 1:  IMPAIRED FASTING GLUCOSE (ICD-790.21) Assessment Comment Only  Orders: T- Hemoglobin A1C ([id number]) pt advised to reduce cal intake and inc activity to reduce the risk of becoming diabetic  Problem # 2:  OBESITY, UNSPECIFIED (ICD-278.00) Assessment: Improved  Ht: 72 (08/17/2010)   Wt: 352.25 (08/17/2010)   BMI: 47.95 (08/17/2010)  Problem # 3:  ALLERGIC RHINITIS CAUSE UNSPECIFIED (ICD-477.9) Assessment: Unchanged  The following medications were removed from the medication list:    Flonase 50 Mcg/act Susp (Fluticasone propionate) ..... One to two puffs per nostriil daily, as needed  Problem # 4:  HYPERTENSION (ICD-401.9) Assessment: Unchanged  His updated medication list for this problem includes:    Hydrochlorothiazide 25 Mg Tabs (Hydrochlorothiazide) .Marland Kitchen... Take 1 tablet by mouth once a day  BP today: 130/90 Prior BP: 130/90 (04/17/2010)  Labs Reviewed: K+: 4.4 (05/25/2010) Creat: : 0.90 (05/25/2010)  Chol: 141 (05/25/2010)   HDL: 34 (05/25/2010)   LDL: 73 (05/25/2010)   TG: 168 (05/25/2010)  Complete Medication List: 1)  Singulair 10 Mg Tabs (Montelukast sodium) .... One tab by mouth qd 2)  Hydrochlorothiazide 25 Mg Tabs (Hydrochlorothiazide) .... Take 1 tablet by mouth once a day 3)  Klor-con M20 20 Meq Cr-tabs (Potassium chloride crys cr) .... Take 1 tablet by mouth once a day  Other  Orders: T-Basic Metabolic Panel [phone])  Patient Instructions: 1)  Please schedule a follow-up appointment in 4.5 months. 2)  It is important that you exercise regularly at least 20 minutes 5 times a week. If you develop chest pain, have severe difficulty breathing, or feel very tired , stop exercising immediately and seek medical attention. 3)  You need to lose weight. Consider a lower calorie diet and regular exercise.  4)  BMP prior to visit, ICD-9:   today 5)  HbgA1C prior to visit, ICD-9: 6)  No med changes at this time   Orders Added: 1)  Est. Patient Level IV [09811] 2)  T-Basic Metabolic Panel [[id number]] 3)  T- Hemoglobin A1C [[id number]]

## 2010-12-22 NOTE — Op Note (Signed)
NAMEMISAEL, MCGAHA                     ACCOUNT NO.:  [id number]   MEDICAL RECORD NO.:  [id number]                   PATIENT TYPE:   LOCATION:                                       FACILITY:   PHYSICIAN:  Georgia Lopes, M.D.               DATE OF BIRTH:   DATE OF PROCEDURE:  03/03/2002  DATE OF DISCHARGE:                                 OPERATIVE REPORT   PREOPERATIVE DIAGNOSES:  1. Impacted teeth #1, 16, 17, and 32.  2. Carious tooth #30.  3. Autism.   POSTOPERATIVE DIAGNOSES:  1. Impacted teeth #1, 16, 17, and 32.  2. Carious tooth #30.  3. Autism.  4. Cyst of right mandible.   PROCEDURE:  1. Removal of teeth #1, 16, 17, 30, and 32.  2. Removal of cyst of right mandible.   SURGEON:  Georgia Lopes, M.D.   ANESTHESIA:  Mask, general oral.   DESCRIPTION OF PROCEDURE:  The patient was taken to the operating room after  having received Versed sedation in the hold area.  An IV was started, and  the patient was transferred to the bed and placed in a supine position.  General anesthesia was administered intravenously, and an oral endotracheal  tube was placed and secured.  The eyes were protected.  The patient was  prepped and draped.  A throat pack was placed.  Then a bite block was placed  in the left side of the mouth, and local anesthesia 2% lidocaine and  1:100,000 epinephrine was infiltrated in an inferior alveolar block on the  right side and in buccal and palatal infiltration around tooth #1.  One  Carpule was used per injection.   The bite block was repositioned, and left inferior alveolar block was  administered with the same local anesthesia solution and infiltration was  also administered around tooth #16, one Carpule per injection.  A total of  four Carpules were used.  Then a #15 blade was used to make a full thickness  incision overlying tooth #17.  This was carried buccally along the crest of  the tissue around tooth #18.  The periosteum was  reflected with a periosteal  elevator.  Bone was removed with a  71 fissure bur and the handpiece using  irrigation.   The tooth was sectioned and removed using a 301 elevator.  A portion of the  distal root remained, and this was removed with the root tip pick.  Then the  socket was curetted, irrigated, and closed with 3-0 chromic. in tooth #16  area.  Full thickness incision was made with a #15 blade overlying tooth  #16.  Periosteum was reflected, and the periosteal elevator was used for  this.  Then a 301 dental elevator was used to elevate the tooth.  The tooth  was removed, and the socket was curetted, irrigated, and closed with 3-0  chromic.  Then the bite block was repositioned.  Attention was turned to the right mandible.  A #15 blade was used to make a  full thickness incision overlying tooth #32.  The periosteum was reflected,  and bone was removed around tooth #32 with a 701 fissure bur under  irrigation.  Then the tooth was removed with a #301 forceps.  The distal  root again fractured on this tooth, and it was removed using root tip pick  and the handpiece to remove bone around the root.  The socket was then  curetted, irrigated, and closed with 3-0 chromic.  Tooth #30 was removed  using #15 blade, a periosteal elevator, a 301 elevator, and forceps.  The  socket was irrigated and closed with 3-0 chromic.   Then tooth #1 was removed using a #15 blade, periosteal elevator to reflect  the periosteum.  Bone was removed with the rongeur, and then the tooth was  elevated with a 301 elevator and removed from the mouth.  The socket was  curetted, irrigated, and closed with 3-0 chromic.  Then the oral cavity was  irrigated copiously.  Gauze sponges were placed in the right and left  posterior.  Prior to placing the gauze sponges, Marcaine injection one per  quadrant was given with 0.5% Marcaine and 1:200,000 epinephrine.   The oral cavity was then irrigated copiously, and the  throat pack was  removed.  Gauze sponges were placed in the right and left posterior aspect  of the mouth, and an oral airway was placed for the anesthetist.  The  patient was awakened, extubated, and taken to the recovery room breathing  spontaneously in good condition.  Estimated blood loss:  Minimum.  Complications:  None.  Cyst of right mandible associated with tooth #30 was  the specimen.  This was curetted after tooth #30 was removed.  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                                               Georgia Lopes, M.D.    SMJ/MEDQ  D:  03/03/2002  T:  03/05/2002  Job:  [id number]   cc:   Georgia Lopes, M.D.

## 2011-01-12 ENCOUNTER — Encounter: Payer: Self-pay | Admitting: Family Medicine

## 2011-01-16 ENCOUNTER — Encounter: Payer: Self-pay | Admitting: Family Medicine

## 2011-01-16 ENCOUNTER — Ambulatory Visit (INDEPENDENT_AMBULATORY_CARE_PROVIDER_SITE_OTHER): Payer: Medicare Other | Admitting: Family Medicine

## 2011-01-16 VITALS — BP 104/68 | HR 91 | Resp 16 | Ht 72.0 in | Wt 351.1 lb

## 2011-01-16 DIAGNOSIS — J309 Allergic rhinitis, unspecified: Secondary | ICD-10-CM

## 2011-01-16 DIAGNOSIS — I10 Essential (primary) hypertension: Secondary | ICD-10-CM

## 2011-01-16 DIAGNOSIS — R5381 Other malaise: Secondary | ICD-10-CM

## 2011-01-16 DIAGNOSIS — E739 Lactose intolerance, unspecified: Secondary | ICD-10-CM

## 2011-01-16 DIAGNOSIS — R5383 Other fatigue: Secondary | ICD-10-CM

## 2011-01-16 DIAGNOSIS — E669 Obesity, unspecified: Secondary | ICD-10-CM

## 2011-01-16 DIAGNOSIS — R7301 Impaired fasting glucose: Secondary | ICD-10-CM

## 2011-01-16 LAB — BASIC METABOLIC PANEL
BUN: 13 mg/dL (ref 6–23)
Chloride: 101 mEq/L (ref 96–112)
Glucose, Bld: 81 mg/dL (ref 70–99)
Potassium: 4.1 mEq/L (ref 3.5–5.3)

## 2011-01-16 NOTE — Assessment & Plan Note (Signed)
Mom re-educated re importance of low carb diet to promote weight loss hence improve blood sugars and reduce the risk of becoming diabetic, this is in his family

## 2011-01-16 NOTE — Patient Instructions (Signed)
F/u in 4 months.  It is important that you exercise regularly at least 30 minutes 5 times a week. If you develop chest pain, have severe difficulty breathing, or feel very tired, stop exercising immediately and seek medical attention  A healthy diet is rich in fruit, vegetables and whole grains. Poultry fish, nuts and beans are a healthy choice for protein rather then red meat. A low sodium diet and drinking 64 ounces of water daily is generally recommended. Oils and sweet should be limited. Carbohydrates especially for those who are diabetic or overweight, should be limited to 34-45 gram per meal. It is important to eat on a regular schedule, at least 3 times daily. Snacks should be primarily fruits, vegetables or nuts.   HBA1C  Today and chem 7  No med changes

## 2011-01-16 NOTE — Assessment & Plan Note (Signed)
[name]. Patient re-educated about  the importance of commitment to a  minimum of 150 minutes of exercise per week. The importance of healthy food choices with portion control discussed. Encouraged to start a food diary, count calories and to consider  joining a support group. Sample diet sheets offered. Goals set by the patient for the next several months.    

## 2011-01-16 NOTE — Progress Notes (Signed)
  Subjective:    Patient ID: Scott Mendez, male    DOB: Oct 10, 1980, 30 y.o.   MRN: [id number]  HPI The PT is here for follow up and re-evaluation of chronic medical conditions, medication management and review of recent lab and radiology data.  Preventive health is updated, specifically   and Immunization.    The PT denies any adverse reactions to current medications since the last visit.  There are no new concerns.  There are no specific complaints       Review of Systems History from mother, pt is aphasic Denies recent fever or chills. Denies sinus pressure, nasal congestion, ear pain or sore throat. Denies chest congestion, productive cough or wheezing. Denies chest pains,  and leg swelling Denies abdominal pain, nausea, vomiting,diarrhea or constipation.  Denies dysuria, frequency, hesitancy or incontinence. Denies joint pain, swelling and limitation in mobility. Denies headaches or  seizure,   Denies skin break down or rash.        Objective:   Physical Exam        Assessment & Plan:

## 2011-01-16 NOTE — Assessment & Plan Note (Signed)
Controlled, no change in medication  

## 2011-03-28 ENCOUNTER — Telehealth: Payer: Self-pay | Admitting: Family Medicine

## 2011-03-28 MED ORDER — HYDROCHLOROTHIAZIDE 25 MG PO TABS: 25.0000 mg | ORAL_TABLET | Freq: Every day | ORAL | Status: DC

## 2011-03-28 MED ORDER — MONTELUKAST SODIUM 10 MG PO TABS: 10.0000 mg | ORAL_TABLET | Freq: Every day | ORAL | Status: DC

## 2011-03-28 MED ORDER — POTASSIUM CHLORIDE CRYS ER 20 MEQ PO TBCR: 20.0000 meq | EXTENDED_RELEASE_TABLET | Freq: Every day | ORAL | Status: DC

## 2011-03-28 NOTE — Telephone Encounter (Signed)
meds sent as requested 

## 2011-05-14 ENCOUNTER — Telehealth: Payer: Self-pay

## 2011-05-14 DIAGNOSIS — R5381 Other malaise: Secondary | ICD-10-CM

## 2011-05-14 MED ORDER — PROMETHAZINE HCL 12.5 MG PO TABS: 12.5000 mg | ORAL_TABLET | Freq: Three times a day (TID) | ORAL | Status: AC | PRN

## 2011-05-14 NOTE — Telephone Encounter (Signed)
Came in office this am and said that [name] had been sick since Saturday, had been running a fever up to 101 and some vomiting also. There were no appts today or tomorrow per Luann and urgent care or ER was advised. She did not want to go to either one and wanted me to take a message to see if there was anything you recommended CA [phone]

## 2011-05-14 NOTE — Telephone Encounter (Signed)
Tylenol for fever, up to 1 3 times daily, order cbc and diff and chem 7 please, if he has abdominal pain and is not eating needs to go to the Ed let her know, can order the labs as stat this morning pls. Document how often vomited yesterday and today, Let me know pls, send a note, if excessive he will need to go to the Ed today Ok to send in phenergan 12.5 mg one 3 times daily as needed for vomiting #15 only

## 2011-05-14 NOTE — Telephone Encounter (Signed)
Mother aware. He vomited 3 times yesterday but none today. Will take him to have labwork in the am.

## 2011-05-15 LAB — BASIC METABOLIC PANEL
BUN: 16 mg/dL (ref 6–23)
Chloride: 102 mEq/L (ref 96–112)
Potassium: 4.1 mEq/L (ref 3.5–5.3)

## 2011-05-15 LAB — CBC WITH DIFFERENTIAL/PLATELET
Basophils Absolute: 0 10*3/uL (ref 0.0–0.1)
HCT: 46.6 % (ref 39.0–52.0)
Hemoglobin: 15.3 g/dL (ref 13.0–17.0)
Lymphocytes Relative: 16 % (ref 12–46)
Monocytes Absolute: 0.6 10*3/uL (ref 0.1–1.0)
Monocytes Relative: 6 % (ref 3–12)
Neutro Abs: 7.1 10*3/uL (ref 1.7–7.7)
RBC: 5.06 MIL/uL (ref 4.22–5.81)
WBC: 9.9 10*3/uL (ref 4.0–10.5)

## 2011-05-16 ENCOUNTER — Telehealth: Payer: Self-pay | Admitting: Family Medicine

## 2011-05-16 ENCOUNTER — Encounter: Payer: Self-pay | Admitting: Family Medicine

## 2011-05-16 NOTE — Telephone Encounter (Signed)
pls give appt for tomorrow pm

## 2011-05-16 NOTE — Telephone Encounter (Signed)
Mother is aware to bring him 10.11.12 @ 3:45

## 2011-05-16 NOTE — Telephone Encounter (Signed)
Mom said Scott Mendez now has a cough and no congestion that she can see. May have saw small amount of greenish phlegm but that was it. Just keeps coughing. Wants something called into pharmacy

## 2011-05-17 ENCOUNTER — Ambulatory Visit (INDEPENDENT_AMBULATORY_CARE_PROVIDER_SITE_OTHER): Payer: Medicare Other | Admitting: Family Medicine

## 2011-05-17 ENCOUNTER — Encounter: Payer: Self-pay | Admitting: Family Medicine

## 2011-05-17 VITALS — BP 120/70 | HR 84 | Temp 98.1°F | Resp 16 | Ht 72.0 in | Wt 351.0 lb

## 2011-05-17 DIAGNOSIS — I10 Essential (primary) hypertension: Secondary | ICD-10-CM

## 2011-05-17 DIAGNOSIS — J209 Acute bronchitis, unspecified: Secondary | ICD-10-CM

## 2011-05-17 DIAGNOSIS — L089 Local infection of the skin and subcutaneous tissue, unspecified: Secondary | ICD-10-CM | POA: Insufficient documentation

## 2011-05-17 DIAGNOSIS — L989 Disorder of the skin and subcutaneous tissue, unspecified: Secondary | ICD-10-CM

## 2011-05-17 DIAGNOSIS — E669 Obesity, unspecified: Secondary | ICD-10-CM

## 2011-05-17 MED ORDER — POTASSIUM CHLORIDE CRYS ER 20 MEQ PO TBCR: 20.0000 meq | EXTENDED_RELEASE_TABLET | Freq: Every day | ORAL | Status: DC

## 2011-05-17 MED ORDER — BENZONATATE 100 MG PO CAPS: 100.0000 mg | ORAL_CAPSULE | Freq: Three times a day (TID) | ORAL | Status: DC | PRN

## 2011-05-17 MED ORDER — MONTELUKAST SODIUM 10 MG PO TABS: 10.0000 mg | ORAL_TABLET | Freq: Every day | ORAL | Status: DC

## 2011-05-17 MED ORDER — HYDROCHLOROTHIAZIDE 25 MG PO TABS: 25.0000 mg | ORAL_TABLET | Freq: Every day | ORAL | Status: DC

## 2011-05-17 MED ORDER — PENICILLIN V POTASSIUM 500 MG PO TABS: 500.0000 mg | ORAL_TABLET | Freq: Three times a day (TID) | ORAL | Status: AC

## 2011-05-17 NOTE — Patient Instructions (Signed)
F/u mid to end January.  You are being treated for acute bronchitis, meds are sent in.  You are referred to dermatology about your skin.  Return for flu vaccine , nurse visit in about 3 weeks

## 2011-05-17 NOTE — Progress Notes (Signed)
  Subjective:    Patient ID: Scott Mendez, male    DOB: Feb 19, 1981, 30 y.o.   MRN: [id number]  HPI 2 week h/o chest congestion, cough and chills. Had diarreah earlier this week which has resolved C/o lesions behind both ears   Review of Systems See HPI. Mother is historian , pt has MR and expressive aphasia  Denies sinus pressure, nasal congestion, ear pain or sore throat.  Denies chest pains, palpitations and leg swelling Denies abdominal pain, nausea, vomiting,diarrhea or constipation.   Denies dysuria, frequency, hesitancy or incontinence. Denies joint pain, swelling and limitation in mobility. Denies headaches, seizures, numbness, or tingling. Denies depression, anxiety or insomnia.        Objective:   Physical Exam Patient alert and oriented and in no cardiopulmonary distress.  HEENT: No facial asymmetry, EOMI, no sinus tenderness,  oropharynx pink and moist.  Neck supple no adenopathy.  Chest:decreased air entry scattered crackles, no wheezes  CVS: S1, S2 no murmurs, no S3.  ABD: Soft non tender. Bowel sounds normal.  Ext: No edema  MS: Adequate ROM spine, shoulders, hips and knees.  Skin: Intact, raised hypopigmented lesions behind both ears reembling excess adipose tissue  CNS: CN 2-12 intact, power,  normal throughout.        Assessment & Plan:

## 2011-05-20 NOTE — Assessment & Plan Note (Signed)
Controlled, no change in medication  

## 2011-05-20 NOTE — Assessment & Plan Note (Signed)
New skin lesions behind both ears ,uncertain as to etiology, will refer for derm eval

## 2011-05-20 NOTE — Assessment & Plan Note (Signed)
Unchanged. Patient re-educated about  the importance of commitment to a  minimum of 150 minutes of exercise per week. The importance of healthy food choices with portion control discussed. Encouraged to start a food diary, count calories and to consider  joining a support group. Sample diet sheets offered. Goals set by the patient for the next several months.    

## 2011-05-20 NOTE — Assessment & Plan Note (Signed)
Acute bronchitis, antibiotic and decongestant prescribed 

## 2011-05-21 ENCOUNTER — Ambulatory Visit: Payer: Medicare Other | Admitting: Family Medicine

## 2011-05-22 NOTE — Telephone Encounter (Signed)
Patients mother aware  

## 2011-06-07 ENCOUNTER — Ambulatory Visit: Payer: Medicare Other

## 2011-08-22 ENCOUNTER — Encounter: Payer: Self-pay | Admitting: Family Medicine

## 2011-08-23 ENCOUNTER — Encounter: Payer: Self-pay | Admitting: Family Medicine

## 2011-08-23 ENCOUNTER — Ambulatory Visit (INDEPENDENT_AMBULATORY_CARE_PROVIDER_SITE_OTHER): Payer: Medicare Other | Admitting: Family Medicine

## 2011-08-23 VITALS — BP 120/84 | HR 96 | Resp 16 | Ht 72.0 in | Wt 354.1 lb

## 2011-08-23 DIAGNOSIS — R7301 Impaired fasting glucose: Secondary | ICD-10-CM

## 2011-08-23 DIAGNOSIS — R5381 Other malaise: Secondary | ICD-10-CM

## 2011-08-23 DIAGNOSIS — Z79899 Other long term (current) drug therapy: Secondary | ICD-10-CM

## 2011-08-23 DIAGNOSIS — J209 Acute bronchitis, unspecified: Secondary | ICD-10-CM | POA: Diagnosis not present

## 2011-08-23 DIAGNOSIS — R5383 Other fatigue: Secondary | ICD-10-CM

## 2011-08-23 DIAGNOSIS — I10 Essential (primary) hypertension: Secondary | ICD-10-CM

## 2011-08-23 DIAGNOSIS — E739 Lactose intolerance, unspecified: Secondary | ICD-10-CM

## 2011-08-23 DIAGNOSIS — H109 Unspecified conjunctivitis: Secondary | ICD-10-CM | POA: Insufficient documentation

## 2011-08-23 DIAGNOSIS — E669 Obesity, unspecified: Secondary | ICD-10-CM

## 2011-08-23 MED ORDER — SULFAMETHOXAZOLE-TRIMETHOPRIM 800-160 MG PO TABS: 1.0000 | ORAL_TABLET | Freq: Two times a day (BID) | ORAL | Status: AC

## 2011-08-23 MED ORDER — BENZONATATE 100 MG PO CAPS: 100.0000 mg | ORAL_CAPSULE | Freq: Three times a day (TID) | ORAL | Status: DC | PRN

## 2011-08-23 NOTE — Assessment & Plan Note (Addendum)
Pt to continue oral allergy med, no evidence of bacterial superinfection

## 2011-08-23 NOTE — Assessment & Plan Note (Signed)
Controlled, no change in medication  

## 2011-08-23 NOTE — Progress Notes (Signed)
  Subjective:    Patient ID: Scott Mendez, male    DOB: Oct 09, 1980, 31 y.o.   MRN: [id number]  HPI 1 week h/o chest congestion and cough, mom has also been sick.  3 day h/o red left eye with increased drainage which is clear. Intermittent chills, but no documented fever.. No concerted efftort at weight loss through lifestyle change, continues to gain weight   Review of Systems See HPI Denies recent fever or chills.  Denies chest pains, palpitations and leg swelling Denies abdominal pain, Denies joint pain, swelling  Denies dysuria and frequency. Denies depression, anxiety or insomnia. Denies skin break down or rash.        Objective:   Physical Exam  Patient alert and oriented and in no cardiopulmonary distress.Mobidly obese  HEENT: No facial asymmetry, EOMI, no sinus tenderness,  oropharynx pink and moist.  Neck supple no adenopathy.  Chest: decreased air entry scattered crackles, no whezes  CVS: S1, S2 no murmurs, no S3.  ABD: Soft non tender. Bowel sounds normal.  Ext: No edema  MS: Adequate ROM spine, shoulders, hips and knees.  Skin: Intact, no ulcerations or rash noted.  PCNS: CN 2-12 intact, power,  normal throughout.       Assessment & Plan:

## 2011-08-23 NOTE — Patient Instructions (Signed)
F/u in 4 months.  Medication is sent in for acute bronchitis. Continue allergy tablets, no eye drops are prescribed at this time, call if eye gets worse.  Fasting lipid, chem 7, HBA1C and TSH in 4 months just before visit.  Blood pressure is excellent, no med change   A healthy diet is rich in fruit, vegetables and whole grains. Poultry fish, nuts and beans are a healthy choice for protein rather then red meat. A low sodium diet and drinking 64 ounces of water daily is generally recommended. Oils and sweet should be limited. Carbohydrates especially for those who are diabetic or overweight, should be limited to 34-45 gram per meal. It is important to eat on a regular schedule, at least 3 times daily. Snacks should be primarily fruits, vegetables or nuts.  It is important that you exercise regularly at least 30 minutes 5 times a week. If you develop chest pain, have severe difficulty breathing, or feel very tired, stop exercising immediately and seek medical attention

## 2011-08-25 NOTE — Assessment & Plan Note (Signed)
Acute episode antibiotic and decongestant prescribed

## 2011-08-25 NOTE — Assessment & Plan Note (Signed)
Low carb diet encouraged and discussed, updated lab data needed

## 2011-08-25 NOTE — Assessment & Plan Note (Signed)
[name]. Patient re-educated about  the importance of commitment to a  minimum of 150 minutes of exercise per week. The importance of healthy food choices with portion control discussed. Encouraged to start a food diary, count calories and to consider  joining a support group. Sample diet sheets offered. Goals set by the patient for the next several months.    

## 2011-12-14 DIAGNOSIS — I10 Essential (primary) hypertension: Secondary | ICD-10-CM | POA: Diagnosis not present

## 2011-12-14 DIAGNOSIS — Z79899 Other long term (current) drug therapy: Secondary | ICD-10-CM | POA: Diagnosis not present

## 2011-12-14 DIAGNOSIS — R7301 Impaired fasting glucose: Secondary | ICD-10-CM | POA: Diagnosis not present

## 2011-12-14 DIAGNOSIS — R5381 Other malaise: Secondary | ICD-10-CM | POA: Diagnosis not present

## 2011-12-14 LAB — LIPID PANEL
Cholesterol: 146 mg/dL (ref 0–200)
HDL: 37 mg/dL — ABNORMAL LOW (ref >39)
Total CHOL/HDL Ratio: 3.9 Ratio
Triglycerides: 68 mg/dL (ref <150)
VLDL: 14 mg/dL (ref 0–40)

## 2011-12-14 LAB — BASIC METABOLIC PANEL
BUN: 16 mg/dL (ref 6–23)
Calcium: 9.4 mg/dL (ref 8.4–10.5)
Creat: 0.86 mg/dL (ref 0.50–1.35)
Glucose, Bld: 91 mg/dL (ref 70–99)

## 2011-12-15 LAB — HEMOGLOBIN A1C
Hgb A1c MFr Bld: 5.1 %
Mean Plasma Glucose: 100 mg/dL

## 2011-12-19 ENCOUNTER — Ambulatory Visit (INDEPENDENT_AMBULATORY_CARE_PROVIDER_SITE_OTHER): Payer: Medicare Other | Admitting: Family Medicine

## 2011-12-19 ENCOUNTER — Encounter: Payer: Self-pay | Admitting: Family Medicine

## 2011-12-19 VITALS — BP 130/78 | HR 94 | Resp 18 | Ht 72.0 in | Wt 354.1 lb

## 2011-12-19 DIAGNOSIS — E669 Obesity, unspecified: Secondary | ICD-10-CM | POA: Diagnosis not present

## 2011-12-19 DIAGNOSIS — I10 Essential (primary) hypertension: Secondary | ICD-10-CM

## 2011-12-19 DIAGNOSIS — J309 Allergic rhinitis, unspecified: Secondary | ICD-10-CM

## 2011-12-19 DIAGNOSIS — Z23 Encounter for immunization: Secondary | ICD-10-CM | POA: Diagnosis not present

## 2011-12-19 DIAGNOSIS — L0201 Cutaneous abscess of face: Secondary | ICD-10-CM | POA: Insufficient documentation

## 2011-12-19 DIAGNOSIS — L03211 Cellulitis of face: Secondary | ICD-10-CM

## 2011-12-19 MED ORDER — CEPHALEXIN 500 MG PO CAPS: 500.0000 mg | ORAL_CAPSULE | Freq: Two times a day (BID) | ORAL | Status: AC

## 2011-12-19 NOTE — Patient Instructions (Addendum)
F/u in 4 month  It is important that you exercise regularly at least 30 minutes 5 times a week. If you develop chest pain, have severe difficulty breathing, or feel very tired, stop exercising immediately and seek medical attention   A healthy diet is rich in fruit, vegetables and whole grains. Poultry fish, nuts and beans are a healthy choice for protein rather then red meat. A low sodium diet and drinking 64 ounces of water daily is generally recommended. Oils and sweet should be limited. Carbohydrates especially for those who are diabetic or overweight, should be limited to 30-45 gram per meal. It is important to eat on a regular schedule, at least 3 times daily. Snacks should be primarily fruits, vegetables or nuts.  Blood pressure and cholesterol are good.  You need to work on weight loss  TdAP today and medication sent in for the cut on your face. You need to stop scratching your face

## 2011-12-19 NOTE — Progress Notes (Signed)
  Subjective:    Patient ID: Scott Mendez, male    DOB: 1980/11/18, 31 y.o.   MRN: [id number]  HPI History obtained from mother. Pt has expressive aphasia and is severely MR The PT is here for follow up and re-evaluation of chronic medical conditions, medication management and review of any available recent lab and radiology data.  Preventive health is updated, specifically  Cancer screening and Immunization.   The PT denies any adverse reactions to current medications since the last visit.  C/o redness and ulceration on right jaw where [name] has been scratching his face recently, no purulent drainage or fever was noted     Review of Systems See HPI Denies recent fever or chills. Denies sinus pressure, nasal congestion, ear pain or sore throat. Denies chest congestion, productive cough or wheezing. Denies chest pains, palpitations and leg swelling Denies abdominal pain, nausea, vomiting,diarrhea or constipation.   Denies dysuria, frequency, hesitancy or incontinence. Denies joint pain, swelling and limitation in mobility. Denies headaches, seizures, numbness, or tingling. Denies depression, anxiety or insomnia.       Objective:   Physical Exam Patient alert and in no cardiopulmonary distress.Morbidly obese, pleasant  HEENT: No facial asymmetry, EOMI, no sinus tenderness,  oropharynx pink and moist.  Neck supple no adenopathy.  Chest: Clear to auscultation bilaterally.  CVS: S1, S2 no murmurs, no S3.  ABD: Soft non tender. Bowel sounds normal.  Ext: No edema  MS: Adequate ROM spine, shoulders, hips and knees.  Skin: erythematous rash with ulceration on right jaw  Psych: Good eye contact, normal affect. Memory intact not anxious or depressed appearing.  CNS: CN 2-12 intact, power, tone and sensation normal throughout.        Assessment & Plan:

## 2011-12-19 NOTE — Assessment & Plan Note (Signed)
Unchanged. Patient re-educated about  the importance of commitment to a  minimum of 150 minutes of exercise per week. The importance of healthy food choices with portion control discussed. Encouraged to start a food diary, count calories and to consider  joining a support group. Sample diet sheets offered. Goals set by the patient for the next several months.    

## 2011-12-20 NOTE — Assessment & Plan Note (Signed)
Antibiotic prescribed 

## 2011-12-20 NOTE — Assessment & Plan Note (Signed)
Controlled, no change in medication  

## 2011-12-21 ENCOUNTER — Ambulatory Visit: Payer: Medicare Other | Admitting: Family Medicine

## 2011-12-27 ENCOUNTER — Telehealth: Payer: Self-pay | Admitting: Family Medicine

## 2011-12-27 MED ORDER — HYDROCHLOROTHIAZIDE 25 MG PO TABS: 25.0000 mg | ORAL_TABLET | Freq: Every day | ORAL | Status: DC

## 2011-12-27 MED ORDER — MONTELUKAST SODIUM 10 MG PO TABS: 10.0000 mg | ORAL_TABLET | Freq: Every day | ORAL | Status: DC

## 2011-12-27 MED ORDER — POTASSIUM CHLORIDE CRYS ER 20 MEQ PO TBCR: 20.0000 meq | EXTENDED_RELEASE_TABLET | Freq: Every day | ORAL | Status: DC

## 2011-12-27 NOTE — Telephone Encounter (Signed)
Sent in requested med but not on any pain med, although I did see HCTZ was the only other one listed and I assume it was that one that needed the fill also

## 2012-04-22 ENCOUNTER — Ambulatory Visit (HOSPITAL_COMMUNITY)
Admission: RE | Admit: 2012-04-22 | Discharge: 2012-04-22 | Disposition: A | Discharge status: Home / Self Care | Payer: Medicare Other | Source: Ambulatory Visit | Attending: Family Medicine | Admitting: Family Medicine

## 2012-04-22 ENCOUNTER — Ambulatory Visit (INDEPENDENT_AMBULATORY_CARE_PROVIDER_SITE_OTHER): Payer: Medicare Other | Admitting: Family Medicine

## 2012-04-22 ENCOUNTER — Encounter: Payer: Self-pay | Admitting: Family Medicine

## 2012-04-22 VITALS — BP 134/74 | HR 100 | Resp 18 | Wt 349.0 lb

## 2012-04-22 DIAGNOSIS — M25471 Effusion, right ankle: Secondary | ICD-10-CM

## 2012-04-22 DIAGNOSIS — M25473 Effusion, unspecified ankle: Secondary | ICD-10-CM | POA: Diagnosis not present

## 2012-04-22 DIAGNOSIS — M25476 Effusion, unspecified foot: Secondary | ICD-10-CM | POA: Insufficient documentation

## 2012-04-22 DIAGNOSIS — M7989 Other specified soft tissue disorders: Secondary | ICD-10-CM | POA: Diagnosis not present

## 2012-04-22 DIAGNOSIS — M25579 Pain in unspecified ankle and joints of unspecified foot: Secondary | ICD-10-CM | POA: Insufficient documentation

## 2012-04-22 DIAGNOSIS — E669 Obesity, unspecified: Secondary | ICD-10-CM

## 2012-04-22 DIAGNOSIS — J309 Allergic rhinitis, unspecified: Secondary | ICD-10-CM | POA: Diagnosis not present

## 2012-04-22 DIAGNOSIS — Z23 Encounter for immunization: Secondary | ICD-10-CM | POA: Diagnosis not present

## 2012-04-22 DIAGNOSIS — I10 Essential (primary) hypertension: Secondary | ICD-10-CM | POA: Diagnosis not present

## 2012-04-22 DIAGNOSIS — B351 Tinea unguium: Secondary | ICD-10-CM

## 2012-04-22 LAB — COMPREHENSIVE METABOLIC PANEL
AST: 28 U/L (ref 0–37)
Albumin: 4.6 g/dL (ref 3.5–5.2)
Alkaline Phosphatase: 79 U/L (ref 39–117)
BUN: 16 mg/dL (ref 6–23)
Potassium: 3.8 mEq/L (ref 3.5–5.3)
Sodium: 142 mEq/L (ref 135–145)
Total Protein: 6.6 g/dL (ref 6.0–8.3)

## 2012-04-22 MED ORDER — TERBINAFINE HCL 250 MG PO TABS: 250.0000 mg | ORAL_TABLET | Freq: Every day | ORAL | Status: DC

## 2012-04-22 MED ORDER — IBUPROFEN 600 MG PO TABS: ORAL_TABLET | ORAL | Status: AC

## 2012-04-22 NOTE — Patient Instructions (Addendum)
Annual wellness in 4 month  CMP today and uric acid level  Xray of right ankle today, I bel;ieve there is some arthritis present, weight loss will help  Hepatic panel in 6 weeks since you are on medication that may affect the liver   New medications today, ibuprofen 3 times daily for 1 week for ankle pain and swelling  Terbinafine tablets one daily for 12 weeks for fungal toenail infection.  Flu vaccine today

## 2012-05-04 NOTE — Assessment & Plan Note (Signed)
New complaint will check xray

## 2012-05-04 NOTE — Assessment & Plan Note (Signed)
Unchanged. Patient re-educated about  the importance of commitment to a  minimum of 150 minutes of exercise per week. The importance of healthy food choices with portion control discussed. Encouraged to start a food diary, count calories and to consider  joining a support group. Sample diet sheets offered. Goals set by the patient for the next several months.    

## 2012-05-04 NOTE — Progress Notes (Signed)
  Subjective:    Patient ID: Scott Mendez, male    DOB: [birth year], 31 y.o.   MRN: [id number]  HPI The PT is here for follow up and re-evaluation of chronic medical conditions, medication management and review of any available recent lab and radiology data.  Preventive health is updated, specifically  Cancer screening and Immunization.   Questions or concerns regarding consultations or procedures which the PT has had in the interim are  addressed. The PT denies any adverse reactions to current medications since the last visit.  C/o new right ankle pain and swelling, no trauma noted    Review of Systems See HPI mother is historian, pt unable Denies recent fever or chills. Denies sinus pressure, nasal congestion, ear pain or sore throat. Denies chest congestion, productive cough or wheezing. Denies chest pains, palpitations and leg swelling Denies abdominal pain, nausea, vomiting,diarrhea or constipation.   Denies dysuria, frequency, hesitancy or incontinence. . Denies headaches, seizures, numbness, or tingling. Denies depression, anxiety or insomnia. Denies skin break down or rash.        Objective:   Physical Exam  Patient alert  and in no cardiopulmonary distress.Expressive aphasia, due to Scott Mendez  HEENT: No facial asymmetry, EOMI, no sinus tenderness,  oropharynx pink and moist.  Neck supple no adenopathy.  Chest: Clear to auscultation bilaterally.  CVS: S1, S2 no murmurs, no S3.  ABD: Soft non tender. Bowel sounds normal.  Ext: No edema  MS: decreased  ROM spine, shoulders, hips and knees.Right kswollen and slightly tender, with decreased ROM  Skin: Intact, no ulcerations or rash noted.Severe onychomycosis  Psych: Good eye contact, normal affect. Memory loss not anxious or depressed appearing.  CNS: CN 2-12 intact, power, tone and sensation normal throughout.       Assessment & Plan:

## 2012-05-04 NOTE — Assessment & Plan Note (Signed)
Oral antifungal x 3 to 4 month

## 2012-05-04 NOTE — Assessment & Plan Note (Signed)
Controlled, no change in medication  

## 2012-05-04 NOTE — Assessment & Plan Note (Signed)
Controlled, no change in medication DASH diet and commitment to daily physical activity for a minimum of 30 minutes discussed and encouraged, as a part of hypertension management. The importance of attaining a healthy weight is also discussed.  

## 2012-06-06 ENCOUNTER — Other Ambulatory Visit: Payer: Self-pay | Admitting: Family Medicine

## 2012-07-16 ENCOUNTER — Ambulatory Visit (INDEPENDENT_AMBULATORY_CARE_PROVIDER_SITE_OTHER): Payer: Medicare Other | Admitting: Family Medicine

## 2012-07-16 ENCOUNTER — Encounter: Payer: Self-pay | Admitting: Family Medicine

## 2012-07-16 VITALS — BP 130/90 | HR 103 | Temp 98.3°F | Resp 16 | Ht 72.0 in | Wt 349.0 lb

## 2012-07-16 DIAGNOSIS — I10 Essential (primary) hypertension: Secondary | ICD-10-CM | POA: Diagnosis not present

## 2012-07-16 DIAGNOSIS — E669 Obesity, unspecified: Secondary | ICD-10-CM

## 2012-07-16 DIAGNOSIS — J209 Acute bronchitis, unspecified: Secondary | ICD-10-CM

## 2012-07-16 MED ORDER — BENZONATATE 100 MG PO CAPS: 100.0000 mg | ORAL_CAPSULE | Freq: Four times a day (QID) | ORAL | Status: DC | PRN

## 2012-07-16 MED ORDER — SULFAMETHOXAZOLE-TRIMETHOPRIM 800-160 MG PO TABS: 1.0000 | ORAL_TABLET | Freq: Two times a day (BID) | ORAL | Status: AC

## 2012-07-16 NOTE — Progress Notes (Signed)
  Subjective:    Patient ID: Scott Mendez, male    DOB: [birth year], 31 y.o.   MRN: [id number]  HPI 5 day h/o cough productive of green sputum and chest congestion, with chills and low grade fever. Prior to this he had been well   Review of Systems See HPI History is from mother, pt unable  Denies chest pains, palpitations and leg swelling Denies abdominal pain, nausea, vomiting,diarrhea or constipation.   Denies dysuria, frequency, hesitancy or incontinence. Denies joint pain, swelling and limitation in mobility. Denies headaches,sDenies skin break down or rash.        Objective:   Physical Exam  Patient alert and oriented and in no cardiopulmonary distress.  HEENT: No facial asymmetry, EOMI, maxillary  sinus tenderness,  oropharynx pink and moist.  Neck supple no adenopathy.  Chest: decreased air entry bilateral crackles, no wheezes  CVS: S1, S2 no murmurs, no S3.  ABD: Soft non tender. Bowel sounds normal.  Ext: No edema  MS: Adequate ROM spine, shoulders, hips and knees.  Skin: Intact, no ulcerations or rash noted.  Psych: Good eye contact, normal affect. Memory intact not anxious or depressed appearing.  CNS: CN 2-12 intact, power, tone and sensation normal throughout.      Assessment & Plan:

## 2012-07-16 NOTE — Patient Instructions (Addendum)
F/u as before.  You are being treated for acute bronchitis and medication is sent to your pharmacy. I trust that yiou will feel better soon

## 2012-07-20 NOTE — Assessment & Plan Note (Signed)
Controlled, no change in medication DASH diet and commitment to daily physical activity for a minimum of 30 minutes discussed and encouraged, as a part of hypertension management. The importance of attaining a healthy weight is also discussed.  

## 2012-07-20 NOTE — Assessment & Plan Note (Signed)
Unchanged. Patient re-educated about  the importance of commitment to a  minimum of 150 minutes of exercise per week. The importance of healthy food choices with portion control discussed. Encouraged to start a food diary, count calories and to consider  joining a support group. Sample diet sheets offered. Goals set by the patient for the next several months.    

## 2012-07-20 NOTE — Assessment & Plan Note (Signed)
Antibiotic and decongestant prescribed, fluids and rest

## 2012-08-20 ENCOUNTER — Other Ambulatory Visit: Payer: Self-pay | Admitting: Family Medicine

## 2012-08-20 DIAGNOSIS — B351 Tinea unguium: Secondary | ICD-10-CM | POA: Diagnosis not present

## 2012-08-21 LAB — HEPATIC FUNCTION PANEL
ALT: 30 U/L (ref 0–53)
AST: 21 U/L (ref 0–37)
Albumin: 4.4 g/dL (ref 3.5–5.2)
Alkaline Phosphatase: 82 U/L (ref 39–117)
Indirect Bilirubin: 0.5 mg/dL (ref 0.0–0.9)
Total Protein: 6.6 g/dL (ref 6.0–8.3)

## 2012-08-22 ENCOUNTER — Ambulatory Visit (INDEPENDENT_AMBULATORY_CARE_PROVIDER_SITE_OTHER): Payer: Medicare Other | Admitting: Family Medicine

## 2012-08-22 ENCOUNTER — Encounter: Payer: Self-pay | Admitting: Family Medicine

## 2012-08-22 VITALS — BP 122/84 | HR 65 | Resp 16 | Ht 72.0 in | Wt 345.0 lb

## 2012-08-22 DIAGNOSIS — E669 Obesity, unspecified: Secondary | ICD-10-CM

## 2012-08-22 DIAGNOSIS — Z79899 Other long term (current) drug therapy: Secondary | ICD-10-CM

## 2012-08-22 DIAGNOSIS — R5383 Other fatigue: Secondary | ICD-10-CM

## 2012-08-22 DIAGNOSIS — L0291 Cutaneous abscess, unspecified: Secondary | ICD-10-CM

## 2012-08-22 DIAGNOSIS — B351 Tinea unguium: Secondary | ICD-10-CM | POA: Diagnosis not present

## 2012-08-22 DIAGNOSIS — E739 Lactose intolerance, unspecified: Secondary | ICD-10-CM

## 2012-08-22 DIAGNOSIS — I10 Essential (primary) hypertension: Secondary | ICD-10-CM

## 2012-08-22 DIAGNOSIS — L0390 Cellulitis, unspecified: Secondary | ICD-10-CM | POA: Insufficient documentation

## 2012-08-22 DIAGNOSIS — R7301 Impaired fasting glucose: Secondary | ICD-10-CM

## 2012-08-22 DIAGNOSIS — J309 Allergic rhinitis, unspecified: Secondary | ICD-10-CM

## 2012-08-22 DIAGNOSIS — R5381 Other malaise: Secondary | ICD-10-CM

## 2012-08-22 MED ORDER — TERBINAFINE HCL 250 MG PO TABS: 250.0000 mg | ORAL_TABLET | Freq: Every day | ORAL | Status: DC

## 2012-08-22 MED ORDER — DOXYCYCLINE HYCLATE 100 MG PO TABS: 100.0000 mg | ORAL_TABLET | Freq: Two times a day (BID) | ORAL | Status: AC

## 2012-08-22 NOTE — Patient Instructions (Addendum)
F/u in 4 month (annual wellness)  New medication, lamisil, take one every day for fungal toenail infection, also 1 week course of antibiotic prescribed for cellulitis affecting the righ great toes.  CBC, fasting lipid, cmp, TSH and HBA1C in 4 month  It is important that you exercise regularly at least 30 minutes 5 times a week. If you develop chest pain, have severe difficulty breathing, or feel very tired, stop exercising immediately and seek medical attention   A healthy diet is rich in fruit, vegetables and whole grains. Poultry fish, nuts and beans are a healthy choice for protein rather then red meat. A low sodium diet and drinking 64 ounces of water daily is generally recommended. Oils and sweet should be limited. Carbohydrates especially for those who are diabetic or overweight, should be limited to 34-45 gram per meal. It is important to eat on a regular schedule, at least 3 times daily. Snacks should be primarily fruits, vegetables or nuts.   Congrats on weight loss, keep it up!

## 2012-08-23 NOTE — Assessment & Plan Note (Signed)
Mild erythema of right great toe, antibiotic prescribed

## 2012-08-23 NOTE — Progress Notes (Signed)
  Subjective:    Patient ID: Scott Mendez, male    DOB: 1980-12-07, 32 y.o.   MRN: [id number]  HPI The PT is here for follow up and re-evaluation of chronic medical conditions, medication management and review of any available recent lab and radiology data.  Preventive health is updated, specifically  Cancer screening and Immunization.    The PT denies any adverse reactions to current medications since the last visit.  C/o redness and swelling around right great toe.  Reports improvement in fungal nail infection but wants to continue treatment Reduced intake with weight loss which is great     Review of Systems  See HPI, history from mother Denies recent fever or chills. Denies sinus pressure, nasal congestion, ear pain or sore throat. Denies chest congestion, productive cough or wheezing. Denies chest pains, palpitations and leg swelling Denies  nausea, vomiting,diarrhea or constipation.   Denies dysuria, frequency, hesitancy or incontinence. Denies joint pain, swelling and limitation in mobility.       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  Neck supple no adenopathy.  Chest: Clear to auscultation bilaterally.  CVS: S1, S2 no murmurs, no S3.  ABD: Soft non tender. Bowel sounds normal.  Ext: No edema    Skin: Intact, erythema and mild swelling of right great toe, and bilateral onychomycosis    Assessment & Plan:

## 2012-08-23 NOTE — Assessment & Plan Note (Signed)
Controlled, no change in medication  

## 2012-08-23 NOTE — Assessment & Plan Note (Signed)
Improved on lamisil, but an ongoing problem Will continue oral medication

## 2012-08-23 NOTE — Assessment & Plan Note (Signed)
Patient educated about the importance of limiting  Carbohydrate intake , the need to commit to daily physical activity for a minimum of 30 minutes , and to commit weight loss. The fact that changes in all these areas will reduce or eliminate all together the development of diabetes is stressed.    

## 2012-08-23 NOTE — Assessment & Plan Note (Signed)
Controlled, no change in medication DASH diet and commitment to daily physical activity for a minimum of 30 minutes discussed and encouraged, as a part of hypertension management. The importance of attaining a healthy weight is also discussed.  

## 2012-10-17 ENCOUNTER — Other Ambulatory Visit: Payer: Self-pay | Admitting: Family Medicine

## 2012-12-04 ENCOUNTER — Encounter: Payer: Self-pay | Admitting: Family Medicine

## 2012-12-04 ENCOUNTER — Ambulatory Visit (INDEPENDENT_AMBULATORY_CARE_PROVIDER_SITE_OTHER): Payer: Medicare Other | Admitting: Family Medicine

## 2012-12-04 VITALS — BP 130/72 | HR 100 | Temp 98.1°F | Resp 20 | Wt 337.1 lb

## 2012-12-04 DIAGNOSIS — IMO0002 Reserved for concepts with insufficient information to code with codable children: Secondary | ICD-10-CM

## 2012-12-04 DIAGNOSIS — I10 Essential (primary) hypertension: Secondary | ICD-10-CM

## 2012-12-04 DIAGNOSIS — J209 Acute bronchitis, unspecified: Secondary | ICD-10-CM

## 2012-12-04 DIAGNOSIS — J309 Allergic rhinitis, unspecified: Secondary | ICD-10-CM

## 2012-12-04 DIAGNOSIS — R4589 Other symptoms and signs involving emotional state: Secondary | ICD-10-CM

## 2012-12-04 DIAGNOSIS — E669 Obesity, unspecified: Secondary | ICD-10-CM

## 2012-12-04 DIAGNOSIS — R451 Restlessness and agitation: Secondary | ICD-10-CM

## 2012-12-04 MED ORDER — FLUTICASONE PROPIONATE 50 MCG/ACT NA SUSP: 2.0000 | Freq: Every day | NASAL | Status: DC

## 2012-12-04 MED ORDER — BENZONATATE 100 MG PO CAPS: 100.0000 mg | ORAL_CAPSULE | Freq: Four times a day (QID) | ORAL | Status: DC | PRN

## 2012-12-04 MED ORDER — PENICILLIN V POTASSIUM 500 MG PO TABS: 500.0000 mg | ORAL_TABLET | Freq: Three times a day (TID) | ORAL | Status: DC

## 2012-12-04 NOTE — Progress Notes (Signed)
  Subjective:    Patient ID: Scott Mendez, male    DOB: 1980-12-26, 32 y.o.   MRN: [id number]  HPI 4 day h/o runny nose , sneezing and excessive cough, had a fever up to 100.4.Also note to be pulling at ears States he has been noted  To have  1 month h/o increased agitiation noted at the center, not a danger to others but potentially a danger to himself   Review of Systems See HPI Denies chest pains, palpitations and leg swelling Denies abdominal pain, nausea, vomiting,diarrhea or constipation.   Denies dysuria, frequency, hesitancy or incontinence. Denies joint pain, swelling and limitation in mobility. Denies headaches, seizures, numbness, or tingling. Denies depression, anxiety or insomnia. Denies skin break down or rash.        Objective:   Physical Exam  Patient alert and in no cardiopulmonary distress.  HEENT: No facial asymmetry, EOMI, maxillary sinus tenderness,  oropharynx pink and moist.  Neck supple no adenopathy.TM mildly erythematous  Chest: Few scattered crackles, no wheezes, adequate air entry bilaterally CVS: S1, S2 no murmurs, no S3.  ABD: Soft non tender. Bowel sounds normal.  Ext: No edema  MS: Adequate ROM spine, shoulders, hips and knees.  Skin: Intact, no ulcerations or rash noted.  Psych: Good eye contact, not anxious or depressed appearing.  CNS: CN 2-12 intact, power,  normal throughout.       Assessment & Plan:

## 2012-12-04 NOTE — Patient Instructions (Addendum)
F/u as before, please call if you need me before.  Antibiotic and decongestant and allergy spray sent iin  You are referred to Behavioral health for furhter evaluation  Labs prior to next appt

## 2012-12-05 NOTE — Assessment & Plan Note (Signed)
Controlled, no change in medication DASH diet and commitment to daily physical activity for a minimum of 30 minutes discussed and encouraged, as a part of hypertension management. The importance of attaining a healthy weight is also discussed.  

## 2012-12-05 NOTE — Assessment & Plan Note (Signed)
Acute infection, antibiotics and decongestants prescribed

## 2012-12-05 NOTE — Assessment & Plan Note (Signed)
Recent report of new onset behavioral issues in mentally challenged young man, may need medication, will have psych furhter eval

## 2012-12-05 NOTE — Assessment & Plan Note (Signed)
[name]. Patient re-educated about  the importance of commitment to a  minimum of 150 minutes of exercise per week. The importance of healthy food choices with portion control discussed. Encouraged to start a food diary, count calories and to consider  joining a support group. Sample diet sheets offered. Goals set by the patient for the next several months.    

## 2012-12-18 DIAGNOSIS — R7301 Impaired fasting glucose: Secondary | ICD-10-CM | POA: Diagnosis not present

## 2012-12-18 DIAGNOSIS — I10 Essential (primary) hypertension: Secondary | ICD-10-CM | POA: Diagnosis not present

## 2012-12-18 DIAGNOSIS — R5383 Other fatigue: Secondary | ICD-10-CM | POA: Diagnosis not present

## 2012-12-18 DIAGNOSIS — Z79899 Other long term (current) drug therapy: Secondary | ICD-10-CM | POA: Diagnosis not present

## 2012-12-18 LAB — COMPREHENSIVE METABOLIC PANEL
AST: 25 U/L (ref 0–37)
Albumin: 4.3 g/dL (ref 3.5–5.2)
BUN: 14 mg/dL (ref 6–23)
CO2: 28 mEq/L (ref 19–32)
Calcium: 8.8 mg/dL (ref 8.4–10.5)
Chloride: 103 mEq/L (ref 96–112)
Potassium: 4 mEq/L (ref 3.5–5.3)

## 2012-12-18 LAB — CBC WITH DIFFERENTIAL/PLATELET
Basophils Absolute: 0.1 10*3/uL (ref 0.0–0.1)
Basophils Relative: 1 % (ref 0–1)
Eosinophils Absolute: 0.2 10*3/uL (ref 0.0–0.7)
MCH: 29.6 pg (ref 26.0–34.0)
MCHC: 33.6 g/dL (ref 30.0–36.0)
Monocytes Relative: 6 % (ref 3–12)
Neutrophils Relative %: 72 % (ref 43–77)
Platelets: 294 10*3/uL (ref 150–400)
RDW: 12.8 % (ref 11.5–15.5)

## 2012-12-18 LAB — HEMOGLOBIN A1C: Mean Plasma Glucose: 105 mg/dL (ref <117)

## 2012-12-18 LAB — LIPID PANEL
Cholesterol: 150 mg/dL (ref 0–200)
VLDL: 26 mg/dL (ref 0–40)

## 2012-12-19 ENCOUNTER — Ambulatory Visit: Payer: Medicare Other | Admitting: Family Medicine

## 2012-12-22 ENCOUNTER — Ambulatory Visit (HOSPITAL_COMMUNITY)
Admission: RE | Admit: 2012-12-22 | Discharge: 2012-12-22 | Disposition: A | Discharge status: Home / Self Care | Payer: Medicare Other | Source: Ambulatory Visit | Attending: Family Medicine | Admitting: Family Medicine

## 2012-12-22 ENCOUNTER — Encounter: Payer: Self-pay | Admitting: Family Medicine

## 2012-12-22 ENCOUNTER — Ambulatory Visit (INDEPENDENT_AMBULATORY_CARE_PROVIDER_SITE_OTHER): Payer: Medicare Other | Admitting: Family Medicine

## 2012-12-22 VITALS — BP 134/80 | HR 96 | Resp 16 | Ht 72.0 in | Wt 342.0 lb

## 2012-12-22 DIAGNOSIS — F840 Autistic disorder: Secondary | ICD-10-CM

## 2012-12-22 DIAGNOSIS — B351 Tinea unguium: Secondary | ICD-10-CM | POA: Insufficient documentation

## 2012-12-22 DIAGNOSIS — Z0000 Encounter for general adult medical examination without abnormal findings: Secondary | ICD-10-CM

## 2012-12-22 DIAGNOSIS — I10 Essential (primary) hypertension: Secondary | ICD-10-CM | POA: Insufficient documentation

## 2012-12-22 DIAGNOSIS — R05 Cough: Secondary | ICD-10-CM | POA: Diagnosis not present

## 2012-12-22 DIAGNOSIS — R059 Cough, unspecified: Secondary | ICD-10-CM | POA: Insufficient documentation

## 2012-12-22 DIAGNOSIS — J309 Allergic rhinitis, unspecified: Secondary | ICD-10-CM

## 2012-12-22 MED ORDER — LORATADINE 10 MG PO TABS: 10.0000 mg | ORAL_TABLET | Freq: Every day | ORAL | Status: DC

## 2012-12-22 NOTE — Assessment & Plan Note (Signed)
Appears to have recurrent discomfort from lefdt great toenail which is thickened with fungl infection and also ingrown. Referral to podiatry

## 2012-12-22 NOTE — Assessment & Plan Note (Signed)
Annual wellness as documented. Pt is autistic, unable to live independently, is cared for by his mother who received daily help, he is also involved in a community program which he attends on weekdays Needs to seriously work on lifestyle change to reduce weight

## 2012-12-22 NOTE — Progress Notes (Signed)
Subjective:    Patient ID: Scott Mendez, male    DOB: 26-Sep-1980, 32 y.o.   MRN: [id number]  HPI Preventive Screening-Counseling & Management   Patient present here today for a Medicare annual wellness visit. Mother c/o persistent cough despite recent antibiotic treatment , also concerned about thick ingrown left great toenail   Current Problems (verified)   Medications Prior to Visit Allergies (verified)   PAST HISTORY  Family History. Pt has 2 siblings in good health, lives with mother who has cared for hiom his entire life. Father deceased in 2003/01/14  Social History Unable to work, [facility]. Never alcohol or drug use. Not sexually active   Risk Factors  Current exercise habits:  none regularly, needs to commit to 30 minutes daily  Dietary issues discussed:Reduce carbs sweets and fat in diet , needs to work at weight loss   Cardiac risk factors:   Depression Screen  (Note: if answer to either of the following is "Yes", a more complete depression screening is indicated)  History from mother, recently noted to have behavioral changes at the center, nothing witnessed at home, has upcoming behavioral health appt Over the past two weeks, have you felt down, depressed or hopeless? No  Over the past two weeks, have you felt little interest or pleasure in doing things? No  Have you lost interest or pleasure in daily life? No  Do you often feel hopeless? No  Do you cry easily over simple problems? No   Activities of Daily Living  In your present state of health, do you have any difficulty performing the following activities?  Driving?: unable Managing money?: unable Feeding yourself?:No Getting from bed to chair?:No Climbing a flight of stairs?:No Preparing food and eating?:No Bathing or showering?:Needs help with bathing Getting dressed?:No Getting to the toilet?:Needs help, mother intends to care for him as long as she is able, does have  sitter 7 days per week from 3 to 8 in the [employer] in week and weekend from 9am to 2 pm Using the toilet?:No Moving around from place to place?: No  Fall Risk Assessment In the past year have you fallen or had a near fall?:No Are you currently taking any medications that make you dizzy?:No   Hearing Difficulties: No Do you often ask people to speak up or repeat themselves?:autistic Do you experience ringing or noises in your ears?:autistic, unable to asses Do you have difficulty understanding soft or whispered voices?:unable to assess  Cognitive Testing  Alert? Yes Normal Appearance?Yes  Oriented to person? Unable to assess Place? Unable to assess  Time? Unable to assess Displays appropriate judgment?unable to asses Can read the correct time from a watch face? no Are you having problems remembering things?unable to assess  Advanced Directives have been discussed with the patient?full code,per mom,   List the Names of Other Physician/Practitioners you currently use: none   Indicate any recent Medical Services you may have received from other than [facility] providers in the past year (date may be approximate).   Assessment:    Annual Wellness Exam   Plan:    During the course of the visit the patient was educated and counseled about appropriate screening and preventive services including:  A healthy diet is rich in fruit, vegetables and whole grains. Poultry fish, nuts and beans are a healthy choice for protein rather then red meat. A low sodium diet and drinking 64 ounces of water daily is generally recommended. Oils and sweet should be  limited. Carbohydrates especially for those who are diabetic or overweight, should be limited to 30-45 gram per meal. It is important to eat on a regular schedule, at least 3 times daily. Snacks should be primarily fruits, vegetables or nuts. It is important that you exercise regularly at least 30 minutes 5 times a week. If you develop chest pain, have severe  difficulty breathing, or feel very tired, stop exercising immediately and seek medical attention  Immunization reviewed and updated. Cancer screening reviewed and updated    Patient Instructions (the written plan) was given to the patient.  Medicare Attestation  I have personally reviewed:  The patient's medical and social history  Their use of alcohol, tobacco or illicit drugs  Their current medications and supplements  The patient's functional ability including ADLs,fall risks, home safety risks, cognitive, and hearing and visual impairment  Diet and physical activities  Evidence for depression or mood disorders  The patient's weight, height, BMI, and visual acuity have been recorded in the chart. I have made referrals, counseling, and provided education to the patient based on review of the above and I have provided the patient with a written personalized care plan for preventive services.      Review of Systems     Objective:   Physical Exam  Chest CTA bilaterally Feet: left great toenail has severe onychomycosis and is ingrown. No erythema or drainage , no sign of acute bacterial infection at this time      Assessment & Plan:

## 2012-12-22 NOTE — Patient Instructions (Addendum)
F/u in 4.5 month, call if you need me before  CXR today for chronic cough  Commit to walking for 20 to 30 minutes every day.  Cut back on portion size and eat mainly vegetable and fruit so you lose weight  New for allergies is once daily claritin , in addition to singulair  You are referred to podiatry re ingrown toenail

## 2012-12-23 ENCOUNTER — Other Ambulatory Visit: Payer: Self-pay

## 2012-12-23 MED ORDER — HYDROCHLOROTHIAZIDE 25 MG PO TABS: ORAL_TABLET | ORAL | Status: DC

## 2012-12-23 MED ORDER — POTASSIUM CHLORIDE CRYS ER 20 MEQ PO TBCR: EXTENDED_RELEASE_TABLET | ORAL | Status: DC

## 2012-12-23 MED ORDER — MONTELUKAST SODIUM 10 MG PO TABS: ORAL_TABLET | ORAL | Status: DC

## 2012-12-31 ENCOUNTER — Ambulatory Visit (INDEPENDENT_AMBULATORY_CARE_PROVIDER_SITE_OTHER): Payer: Medicare Other | Admitting: Psychiatry

## 2012-12-31 ENCOUNTER — Encounter (HOSPITAL_COMMUNITY): Payer: Self-pay | Admitting: Psychiatry

## 2012-12-31 VITALS — BP 127/74 | HR 103 | Ht 71.5 in | Wt 340.6 lb

## 2012-12-31 DIAGNOSIS — F840 Autistic disorder: Secondary | ICD-10-CM | POA: Diagnosis not present

## 2012-12-31 DIAGNOSIS — F79 Unspecified intellectual disabilities: Secondary | ICD-10-CM

## 2012-12-31 DIAGNOSIS — R4689 Other symptoms and signs involving appearance and behavior: Secondary | ICD-10-CM

## 2012-12-31 NOTE — Patient Instructions (Signed)
Have center find a more suitable aid for him.  He may be auditory defensive and a soft voiced person and perhaps a male would be better for him.  No medications indicated at this time.

## 2012-12-31 NOTE — Progress Notes (Signed)
Psychiatric Assessment Adult [phone]  Patient Identification:  Scott Mendez Date of Evaluation:  12/31/2012 Start Time: 11:30 AM End Time: 12:15 PM  Chief Complaint: "Aggression starting two months ago when he changed from a male aid to a male aid". Chief Complaint  Patient presents with  . Establish Care  . Medication Refill  . Other   History of Chief Complaint:   Pt was never aggressive when his father was living.  Since his passing he has been a little aggressive with his mother.  At the day centers he has been in in his life, he was never aggressive until he was changed for a male aid to a male aid 2 months ago.  He has never had any seizures.  He expresses frustration by some agitation and some self hitting.  He has effectively no language or methods to convey any discontent or desires.  He has not method to convey what he is going to do, except a desire to go to the bathroom when out.  He uses a hand motion that his mother notes that he has a desire to go to the bathroom.  Mom is not aware of any way that he indicates that he is suffering from any pain, except he holds his head like it hurts when he doesn't want to go to the center for that day.  This happens about once a month.  Mother does note that he can read and write and actually type with one finger very rapidly.  He has not engaged in expressing himself on the computer despite several attempts to get him to do so.  HPI Review of Systems  Constitutional: Negative.   HENT:       He doesn't like loud noises and the male aid that he has puts his fingers in his ears a lot.  Eyes: Negative.   Respiratory: Negative.   Cardiovascular: Negative.   Gastrointestinal: Negative.   Genitourinary: Negative.   Neurological: Positive for speech difficulty and headaches. Negative for tremors, seizures, syncope and weakness.       Autistic and essentially non communicative. Only has a few signs that indicate his desires such as  need to go to the bathroom or possibly a headache  Psychiatric/Behavioral: Positive for self-injury. Negative for dysphoric mood and agitation. The patient is not nervous/anxious.        Hitting himself seems a way to express frustration in the absence of any effective language skills.   Physical Exam  Depressive Symptoms: unable to fully assess, but no overt symptoms stand out to his mother  (Hypo) Manic Symptoms:   unable to fully assess, but no overt symptoms stand out to his mother  Anxiety Symptoms: unable to fully assess, but no overt symptoms stand out to his mother  Psychotic Symptoms:  unable to fully assess, but no overt symptoms stand out to his mother  PTSD Symptoms: Ever had a traumatic exposure: not to mother's recall  Traumatic Brain Injury: No to the best of mother's recall  Past Psychiatric History: Diagnosis: Autism  Hospitalizations: none  Outpatient Care: PCP  Substance Abuse Care: none  Self-Mutilation: none  Suicidal Attempts: none  Violent Behaviors: seem only related to discontent that he can't explain in any other way   Past Medical History:   Past Medical History  Diagnosis Date  . Allergy   . Hypertension   . Mental retardation   . Autism    History of Loss of Consciousness:  No Seizure History:  No Cardiac History:  No Allergies:  No Known Allergies Current Medications:  Current Outpatient Prescriptions  Medication Sig Dispense Refill  . fluticasone (FLONASE) 50 MCG/ACT nasal spray Place 2 sprays into the nose daily.  16 g  2  . hydrochlorothiazide (HYDRODIURIL) 25 MG tablet TAKE 1 TABLET BY MOUTH ONCE A DAY.  30 tablet  5  . loratadine (CLARITIN) 10 MG tablet Take 1 tablet (10 mg total) by mouth daily.  30 tablet  2  . montelukast (SINGULAIR) 10 MG tablet TAKE ONE TABLET BY MOUTH AT BEDTIME.  30 tablet  5  . potassium chloride SA (K-DUR,KLOR-CON) 20 MEQ tablet TAKE (1) TABLET BY MOUTH ONCE DAILY.  30 tablet  5  . ibuprofen (ADVIL,MOTRIN)  600 MG tablet Take 600 mg by mouth 3 (three) times daily as needed.       No current facility-administered medications for this visit.    Previous Psychotropic Medications:  Medication Dose   no psych meds     Substance Abuse History in the last 12 months: Substance Age of 1st Use Last Use Amount Specific Type  Nicotine  none        Alcohol  none        Cannabis  none        Opiates  none        Cocaine  none        Methamphetamines  none        LSD  none        Ecstasy  none         Benzodiazepines  none        Caffeine  adulthood  3 days ago      Inhalants  none        Others:       sugar  childhood  gum in the office today          Medical Consequences of Substance Abuse: none Legal Consequences of Substance Abuse: none Family Consequences of Substance Abuse: none Blackouts:  No DT's:  No Withdrawal Symptoms:  No   Social History: Current Place of Residence: [address] [place] [address] Place of Birth: [facility] Family Members: mom Marital Status:  Single Children: 0  Sons: 0  Daughters: 0 Relationships: none Education:  skills promoted through 12 years of school Educational Problems/Performance: autistic without any functional language skills Religious Beliefs/Practices: [employer] History of Abuse: none Armed forces technical officer; sheltered Chiropractor History:  None. Legal History: none Hobbies/Interests: watching TV and playing computer  Family History:   Family History  Problem Relation Age of Onset  . Hypertension Mother   . Hypertension Father   . Diabetes Father   . Heart disease Maternal Grandfather   . Diabetes Paternal Grandmother   . Seizures Maternal Uncle   . CVA Maternal Grandmother   . Heart disease Paternal Grandfather   . ADD / ADHD Neg Hx   . Alcohol abuse Neg Hx   . Drug abuse Neg Hx   . Anxiety disorder Neg Hx   . Bipolar disorder Neg Hx   . Depression Neg Hx   . Dementia Neg Hx   . OCD Neg Hx    . Paranoid behavior Neg Hx   . Schizophrenia Neg Hx   . Sexual abuse Neg Hx   . Physical abuse Neg Hx     Mental Status Examination/Evaluation: Objective:  Appearance: Casual  Eye Contact::  Minimal  Speech:  growls when having his  BP taken  Volume:  no speech  Mood:  Unable to differentiate from one day to the next, but may be indicating that he is ready to leave.  Affect:  Flat  Thought Process:  unable to assess  Orientation:  Other:  unable to assess  Thought Content:  unable to assess  Suicidal Thoughts:  No, as far as mother is able to assess  Homicidal Thoughts:  No , as far as mother is able to assess  Judgement:  Other:  unable to assess  Insight:  unable to assess  Psychomotor Activity:  Normal  Akathisia:  No  Handed:  Right  AIMS (if indicated):    Assets:  Others:  pretty patiently follws mothers directions    Laboratory/X-Ray Psychological Evaluation(s)   none  none   Assessment:    AXIS I Autistic Disorder  AXIS II Deferred  AXIS III Past Medical History  Diagnosis Date  . Allergy   . Hypertension   . Mental retardation   . Autism      AXIS IV other psychosocial or environmental problems  AXIS V 61-70 mild symptoms   Treatment Plan/Recommendations:  Plan of Care: encourage center to find a more suitable aid for him  Laboratory:    Psychotherapy: none  Medications: none  Routine PRN Medications:  no  Consultations: none  Safety Concerns:  none  Other:    Plan/Discussion: I took his vitals.  I reviewed CC, tobacco/med/surg Hx, meds effects/ side effects, problem list, therapies and responses as well as current situation/symptoms discussed options. See orders and pt instructions for more details.  MEDICATIONS this encounter: No orders of the defined types were placed in this encounter.   Medical Decision Making Problem Points:  New problem, with no additional work-up planned (3) and Review of psycho-social stressors (1) Data Points:   none  I certify that outpatient services furnished can reasonably be expected to improve the patient's condition.   Orson Aloe, MD, [facility]

## 2013-01-26 ENCOUNTER — Other Ambulatory Visit: Payer: Self-pay | Admitting: Family Medicine

## 2013-01-29 DIAGNOSIS — M79609 Pain in unspecified limb: Secondary | ICD-10-CM | POA: Diagnosis not present

## 2013-01-29 DIAGNOSIS — M25579 Pain in unspecified ankle and joints of unspecified foot: Secondary | ICD-10-CM | POA: Diagnosis not present

## 2013-02-25 ENCOUNTER — Other Ambulatory Visit: Payer: Self-pay | Admitting: Family Medicine

## 2013-03-30 ENCOUNTER — Other Ambulatory Visit: Payer: Self-pay | Admitting: Family Medicine

## 2013-05-18 ENCOUNTER — Encounter (INDEPENDENT_AMBULATORY_CARE_PROVIDER_SITE_OTHER): Payer: Self-pay

## 2013-05-18 ENCOUNTER — Ambulatory Visit (INDEPENDENT_AMBULATORY_CARE_PROVIDER_SITE_OTHER): Payer: Medicare Other | Admitting: Family Medicine

## 2013-05-18 ENCOUNTER — Encounter: Payer: Self-pay | Admitting: Family Medicine

## 2013-05-18 VITALS — BP 128/82 | HR 97 | Resp 16 | Ht 72.0 in | Wt 348.8 lb

## 2013-05-18 DIAGNOSIS — B350 Tinea barbae and tinea capitis: Secondary | ICD-10-CM

## 2013-05-18 DIAGNOSIS — Z23 Encounter for immunization: Secondary | ICD-10-CM

## 2013-05-18 DIAGNOSIS — R7301 Impaired fasting glucose: Secondary | ICD-10-CM

## 2013-05-18 DIAGNOSIS — J309 Allergic rhinitis, unspecified: Secondary | ICD-10-CM

## 2013-05-18 DIAGNOSIS — I10 Essential (primary) hypertension: Secondary | ICD-10-CM | POA: Diagnosis not present

## 2013-05-18 DIAGNOSIS — E669 Obesity, unspecified: Secondary | ICD-10-CM | POA: Diagnosis not present

## 2013-05-18 DIAGNOSIS — E739 Lactose intolerance, unspecified: Secondary | ICD-10-CM

## 2013-05-18 MED ORDER — KETOCONAZOLE 2 % EX SHAM: MEDICATED_SHAMPOO | CUTANEOUS | Status: AC

## 2013-05-18 MED ORDER — IBUPROFEN 600 MG PO TABS: 600.0000 mg | ORAL_TABLET | Freq: Three times a day (TID) | ORAL | Status: DC | PRN

## 2013-05-18 MED ORDER — TERBINAFINE HCL 250 MG PO TABS: 250.0000 mg | ORAL_TABLET | Freq: Every day | ORAL | Status: AC

## 2013-05-18 NOTE — Patient Instructions (Addendum)
F/u in 4.5 month, call if you need me before    Flu vaccine today  Please cut back on sodas and sweets and cookies , you have gained 8 pounds  Chem 7 and HBA1C in 4.5 month, before visit  Tablets for 2 weeks and a shampoo to be used twice daily for 2 weeks  , then as needed

## 2013-05-24 NOTE — Assessment & Plan Note (Signed)
Controlled, no change in medication  

## 2013-05-24 NOTE — Assessment & Plan Note (Signed)
Shampoo and oral med prescribed

## 2013-05-24 NOTE — Assessment & Plan Note (Signed)
[name]. Patient re-educated about  the importance of commitment to a  minimum of 150 minutes of exercise per week. The importance of healthy food choices with portion control discussed. Encouraged to start a food diary, count calories and to consider  joining a support group. Sample diet sheets offered. Goals set by the patient for the next several months.    

## 2013-05-24 NOTE — Progress Notes (Signed)
  Subjective:    Patient ID: Scott Mendez, male    DOB: [birth year], 32 y.o.   MRN: [id number]  HPI The PT is here for follow up and re-evaluation of chronic medical conditions, medication management and review of any available recent lab and radiology data.  Preventive health is updated, specifically   Immunization.    The PT's Mom denies any adverse reactions to current medications since the last visit.  C/o scaly rash on posterior scalo which is spreading in the last 3 weeks with hair loss in the affected area Continues to overeat by "sneaking and getting food from the kitchen"      Review of Systems See HPI History provided by mother Denies recent fever or chills. Denies sinus pressure, nasal congestion,or sore throat. Denies chest congestion, productive cough or wheezing. Denies orthopnea and leg swelling Denies abdominal pain, nausea, vomiting,diarrhea or constipation.   . Denies joint pain, swelling and limitation in mobility. Denies behavioral issues at school or home        Objective:   Physical Exam  Patient alert and in no cardiopulmonary distress.Unable to communicate verbally. Morbidly obese  HEENT: No facial asymmetry, EOMI, no sinus tenderness,  oropharynx pink and moist.  Neck supple no adenopathy.  Chest: Clear to auscultation bilaterally.  CVS: S1, S2 no murmurs, no S3.  ABD: Soft non tender. Bowel sounds normal.  Ext: No edema  MS: Adequate though reduced  ROM spine, shoulders, hips and knees.  Skin: tinea capitis in posterior right scalp affects 3.5 cm diameter area  Psych: Good eye contact,  not anxious or depressed appearing.  CNS: CN 2-12 intact, power, t normal throughout.       Assessment & Plan:

## 2013-05-24 NOTE — Assessment & Plan Note (Signed)
Updated lab needed Mother re educated re need to remove /reduce access to sugar and simple carb access,a lso to encourage more regular exercise and smaller portion size

## 2013-06-04 ENCOUNTER — Other Ambulatory Visit: Payer: Self-pay | Admitting: Family Medicine

## 2013-07-17 ENCOUNTER — Other Ambulatory Visit: Payer: Self-pay | Admitting: Family Medicine

## 2013-08-18 ENCOUNTER — Other Ambulatory Visit: Payer: Self-pay | Admitting: Family Medicine

## 2013-10-16 DIAGNOSIS — I10 Essential (primary) hypertension: Secondary | ICD-10-CM | POA: Diagnosis not present

## 2013-10-16 DIAGNOSIS — R7301 Impaired fasting glucose: Secondary | ICD-10-CM | POA: Diagnosis not present

## 2013-10-16 LAB — BASIC METABOLIC PANEL
BUN: 11 mg/dL (ref 6–23)
CO2: 30 mEq/L (ref 19–32)
Calcium: 9 mg/dL (ref 8.4–10.5)
Chloride: 105 mEq/L (ref 96–112)
Creat: 0.8 mg/dL (ref 0.50–1.35)
Glucose, Bld: 86 mg/dL (ref 70–99)
Potassium: 4.1 mEq/L (ref 3.5–5.3)
Sodium: 145 mEq/L (ref 135–145)

## 2013-10-16 LAB — HEMOGLOBIN A1C
Hgb A1c MFr Bld: 5.6 % (ref <5.7)
Mean Plasma Glucose: 114 mg/dL (ref <117)

## 2013-10-20 ENCOUNTER — Encounter: Payer: Self-pay | Admitting: Family Medicine

## 2013-10-20 ENCOUNTER — Ambulatory Visit (INDEPENDENT_AMBULATORY_CARE_PROVIDER_SITE_OTHER): Payer: Medicare Other | Admitting: Family Medicine

## 2013-10-20 ENCOUNTER — Encounter (INDEPENDENT_AMBULATORY_CARE_PROVIDER_SITE_OTHER): Payer: Self-pay

## 2013-10-20 VITALS — BP 128/84 | HR 99 | Resp 16 | Wt 351.1 lb

## 2013-10-20 DIAGNOSIS — R5383 Other fatigue: Secondary | ICD-10-CM

## 2013-10-20 DIAGNOSIS — J309 Allergic rhinitis, unspecified: Secondary | ICD-10-CM

## 2013-10-20 DIAGNOSIS — Z79899 Other long term (current) drug therapy: Secondary | ICD-10-CM | POA: Diagnosis not present

## 2013-10-20 DIAGNOSIS — I10 Essential (primary) hypertension: Secondary | ICD-10-CM | POA: Diagnosis not present

## 2013-10-20 DIAGNOSIS — R5381 Other malaise: Secondary | ICD-10-CM | POA: Diagnosis not present

## 2013-10-20 DIAGNOSIS — E739 Lactose intolerance, unspecified: Secondary | ICD-10-CM

## 2013-10-20 NOTE — Progress Notes (Signed)
   Subjective:    Patient ID: Scott Mendez, male    DOB: 1980/09/11, 33 y.o.   MRN: [id number]  HPI    Review of Systems     Objective:   Physical Exam        Assessment & Plan:  HYPERTENSION Controlled, no change in medication DASH diet and commitment to daily physical activity for a minimum of 30 minutes discussed and encouraged, as a part of hypertension management. The importance of attaining a healthy weight is also discussed.   ALLERGIC RHINITIS CAUSE UNSPECIFIED Increased symptoms with change in weather, but adequately controlled  IMPAIRED GLUCOSE TOLERANCE [name], needs to work on weight loss, mother re educated re the significance of this  Morbid obesity [name]. Patient re-educated about  the importance of commitment to a  minimum of 150 minutes of exercise per week. The importance of healthy food choices with portion control discussed. Encouraged to start a food diary, count calories and to consider  joining a support group. Sample diet sheets offered. Goals set by the patient for the next several months.

## 2013-10-20 NOTE — Assessment & Plan Note (Signed)
Controlled, no change in medication DASH diet and commitment to daily physical activity for a minimum of 30 minutes discussed and encouraged, as a part of hypertension management. The importance of attaining a healthy weight is also discussed.  

## 2013-10-20 NOTE — Progress Notes (Signed)
   Subjective:    Patient ID: Curlene DolphinJeffrey L Errico, male    DOB: [birth year], 33 y.o.   MRN: [id number]  HPI The PT is here for follow up and re-evaluation of chronic medical conditions, medication management and review of any available recent lab and radiology data.  Preventive health is updated, specifically   Immunization.   The PT denies any adverse reactions to current medications since the last visit.  Bruise noted in on right anterior leg last week, topical antibiotic is taking care of this Had increased clear nasal drainage last week, decreased now, no fever , chills or cough. No falls  Still overeating at midnight , snacks and sweet drinks remain in the house     Review of Systems See HPI, historyiis from his Mom, expressive aphasia Denies recent fever or chills. Denies sinus pressure, nasal congestion, ear pain or sore throat. Denies chest congestion, productive cough or wheezing. Denies chest pains, palpitations and leg swelling Denies abdominal pain, nausea, vomiting,diarrhea or constipation.   Denies dysuria, frequency, hesitancy or incontinence. Denies joint pain, swelling and limitation in mobility. Denies headaches, seizures, numbness, or tingling. Denies depression, anxiety or insomnia. Denies skin break down or rash.        Objective:   Physical Exam  BP 128/84  Pulse 99  Resp 16  Wt 351 lb 1.9 oz (159.267 kg)  SpO2 95% Patient alert and in no cardiopulmonary distress.  HEENT: No facial asymmetry, EOMI, no sinus tenderness,  oropharynx pink and moist.  Neck supple no adenopathy.  Chest: Clear to auscultation bilaterally.  CVS: S1, S2 no murmurs, no S3.  ABD: Soft non tender. Bowel sounds normal.  Ext: No edema  MS: Adequate ROM spine, shoulders, hips and knees.  Skin: Intact, no ulcerations or rash noted.  Psych: Good eye contact, not anxious or depressed appearing.  CNS: CN 2-12 intact, power,normal throughout.       Assessment & Plan:

## 2013-10-20 NOTE — Patient Instructions (Signed)
Annual wellness in 4.5 month, call if you need me before   CBC, fasting chol, TSH, chem 7 and TSH 3 to 5 days before visit  Please drink only water, and cut back , way back on candy and chips  It is important that you exercise regularly at least 30 minutes 5 times a week. If you develop chest pain, have severe difficulty breathing, or feel very tired, stop exercising immediately and seek medical attention   No changes in medication

## 2013-10-20 NOTE — Assessment & Plan Note (Signed)
[name], needs to work on weight loss, mother re educated re the significance of this

## 2013-10-20 NOTE — Assessment & Plan Note (Signed)
[name]. Patient re-educated about  the importance of commitment to a  minimum of 150 minutes of exercise per week. The importance of healthy food choices with portion control discussed. Encouraged to start a food diary, count calories and to consider  joining a support group. Sample diet sheets offered. Goals set by the patient for the next several months.    

## 2013-10-20 NOTE — Assessment & Plan Note (Signed)
Increased symptoms with change in weather, but adequately controlled

## 2013-11-21 ENCOUNTER — Other Ambulatory Visit: Payer: Self-pay | Admitting: Family Medicine

## 2013-12-21 ENCOUNTER — Other Ambulatory Visit: Payer: Self-pay | Admitting: Family Medicine

## 2014-01-25 ENCOUNTER — Other Ambulatory Visit: Payer: Self-pay | Admitting: Family Medicine

## 2014-02-24 ENCOUNTER — Other Ambulatory Visit: Payer: Self-pay | Admitting: Family Medicine

## 2014-03-05 DIAGNOSIS — R5381 Other malaise: Secondary | ICD-10-CM | POA: Diagnosis not present

## 2014-03-05 DIAGNOSIS — R5383 Other fatigue: Secondary | ICD-10-CM | POA: Diagnosis not present

## 2014-03-05 DIAGNOSIS — Z79899 Other long term (current) drug therapy: Secondary | ICD-10-CM | POA: Diagnosis not present

## 2014-03-05 DIAGNOSIS — I10 Essential (primary) hypertension: Secondary | ICD-10-CM | POA: Diagnosis not present

## 2014-03-05 LAB — LIPID PANEL
CHOL/HDL RATIO: 3.8 ratio
Cholesterol: 142 mg/dL (ref 0–200)
HDL: 37 mg/dL — ABNORMAL LOW (ref >39)
LDL Cholesterol: 86 mg/dL (ref 0–99)
TRIGLYCERIDES: 93 mg/dL (ref <150)
VLDL: 19 mg/dL (ref 0–40)

## 2014-03-05 LAB — BASIC METABOLIC PANEL
BUN: 14 mg/dL (ref 6–23)
CALCIUM: 9.5 mg/dL (ref 8.4–10.5)
CO2: 30 mEq/L (ref 19–32)
Chloride: 102 mEq/L (ref 96–112)
Creat: 0.9 mg/dL (ref 0.50–1.35)
GLUCOSE: 76 mg/dL (ref 70–99)
Potassium: 4 mEq/L (ref 3.5–5.3)
Sodium: 140 mEq/L (ref 135–145)

## 2014-03-05 LAB — CBC WITH DIFFERENTIAL/PLATELET
BASOS PCT: 1 % (ref 0–1)
Basophils Absolute: 0.1 10*3/uL (ref 0.0–0.1)
Eosinophils Absolute: 0.1 10*3/uL (ref 0.0–0.7)
Eosinophils Relative: 1 % (ref 0–5)
HEMATOCRIT: 43.5 % (ref 39.0–52.0)
HEMOGLOBIN: 15.2 g/dL (ref 13.0–17.0)
LYMPHS ABS: 1.4 10*3/uL (ref 0.7–4.0)
Lymphocytes Relative: 16 % (ref 12–46)
MCH: 30.2 pg (ref 26.0–34.0)
MCHC: 34.9 g/dL (ref 30.0–36.0)
MCV: 86.5 fL (ref 78.0–100.0)
MONO ABS: 0.5 10*3/uL (ref 0.1–1.0)
MONOS PCT: 6 % (ref 3–12)
NEUTROS ABS: 6.5 10*3/uL (ref 1.7–7.7)
Neutrophils Relative %: 76 % (ref 43–77)
Platelets: 301 10*3/uL (ref 150–400)
RBC: 5.03 MIL/uL (ref 4.22–5.81)
RDW: 13 % (ref 11.5–15.5)
WBC: 8.6 10*3/uL (ref 4.0–10.5)

## 2014-03-06 LAB — TSH: TSH: 1.326 u[IU]/mL (ref 0.350–4.500)

## 2014-03-17 ENCOUNTER — Ambulatory Visit (INDEPENDENT_AMBULATORY_CARE_PROVIDER_SITE_OTHER): Payer: Medicare Other | Admitting: Family Medicine

## 2014-03-17 ENCOUNTER — Encounter: Payer: Self-pay | Admitting: Family Medicine

## 2014-03-17 VITALS — BP 122/84 | HR 96 | Resp 16 | Ht 72.0 in | Wt 345.1 lb

## 2014-03-17 DIAGNOSIS — Z0000 Encounter for general adult medical examination without abnormal findings: Secondary | ICD-10-CM | POA: Insufficient documentation

## 2014-03-17 DIAGNOSIS — L600 Ingrowing nail: Secondary | ICD-10-CM

## 2014-03-17 DIAGNOSIS — B351 Tinea unguium: Secondary | ICD-10-CM

## 2014-03-17 NOTE — Patient Instructions (Signed)
F/u in mid December. All if you need me before  Blood test, blood pressure and weight loss all excellent   Call for flu vaccine in Sept  You will get referral for toenail clipping

## 2014-03-17 NOTE — Progress Notes (Signed)
Subjective:    Patient ID: Scott Mendez, male    DOB: 08-30-1980, 33 y.o.   MRN: [id number]  HPI Preventive Screening-Counseling & Management   Patient present here today for a subsequent Medicare annual wellness visit.   Current Problems (verified)   Medications Prior to Visit Allergies (verified)   PAST HISTORY  Family History (verified)   Social History Mental retardation. Lives with mother who cares for him , never alcohol, nicotine, drug use   Risk Factors  Current exercise habits: Goes to center [facility] and they do exercises daily for 30 minutes   Dietary issues discussed: Heart healthy, increase fruits and vegetables and reduce carbs   Cardiac risk factors: none significant  Depression Screen  (Note: if answer to either of the following is "Yes", a more complete depression screening is indicated)  Assessment is based on observation of his behavior, pt does not speak, he is incapable  Over the past two weeks, have you felt down, depressed or hopeless? No  Over the past two weeks, have you felt little interest or pleasure in doing things? No  Have you lost interest or pleasure in daily life? No  Do you often feel hopeless? No  Do you cry easily over simple problems? No   Activities of Daily Living  In your present state of health, do you have any difficulty performing the following activities? Incapable of all ADL"s independently due to mental retardation Driving?: doesn't drive Managing money?: mother handles finances  Feeding yourself?:No Getting from bed to chair?:No Climbing a flight of stairs?:No Preparing food and eating? Mother prepares food  Bathing or showering?:Needs assistance  Getting dressed?: with assistance  Getting to the toilet?:No Using the toilet?:No Moving around from place to place?: No  Fall Risk Assessment In the past year have you fallen or had a near fall?:No Are you currently taking any medications that make you  dizzy?:No   Hearing Difficulties: Not apparent, listens to TV at regular volume, turns head toward sound at normal level  Do you often ask people to speak up or repeat themselves?:unable to assess, does not speak Do you experience ringing or noises in your ears?:unknown, cannot asesess Do you have difficulty understanding soft or whispered voices?:unable to assess  Cognitive Testing  Alert? Yes Normal Appearance?no , appears mentally challenged Oriented to person? unknown Place? Unable to assess  Time? unknown  Displays appropriate judgment? Doesn't verbalize Can read the correct time from a watch face? Mother states he can tell time but isn't verbal  Are you having problems remembering things?mentally limited to the extent that this is irelevant Advanced Directives have been discussed with the patient?Yes , with Mom, he is a full code   List the Names of Other Physician/Practitioners you currently use: None   Indicate any recent Medical Services you may have received from other than [facility] providers in the past year (date may be approximate).   Assessment:    Annual Wellness Exam   Plan:    .  Medicare Attestation  I have personally reviewed:  The patient's medical and social history  Their use of alcohol, tobacco or illicit drugs  Their current medications and supplements  The patient's functional ability including ADLs,fall risks, home safety risks, cognitive, and hearing and visual impairment  Diet and physical activities  Evidence for depression or mood disorders  The patient's weight, height, BMI, and visual acuity have been recorded in the chart. I have made referrals, counseling, and provided education to the  patient based on review of the above and I have provided the patient with a written personalized care plan for preventive services.      Review of Systems     Objective:   Physical Exam        Assessment & Plan:  Medicare annual wellness visit,  subsequent Annual exam as documented. Counseling done  re healthy lifestyle involving commitment to 150 minutes exercise per week, heart healthy diet, and attaining healthy weight.The importance of adequate sleep also discussed. Regular seat belt use and safe storage  of firearms if patient has them, is also discussed. Changes in health habits are decided on by the patient with goals and time frames  set for achieving them. Immunization and cancer screening needs are specifically addressed at this visit.   Onychomycosis with ingrown toenail mother reports need for assistance with cutting toenails safely, will  refer to podiatry

## 2014-03-21 ENCOUNTER — Encounter: Payer: Self-pay | Admitting: Family Medicine

## 2014-03-21 NOTE — Assessment & Plan Note (Signed)
Annual exam as documented. Counseling done  re healthy lifestyle involving commitment to 150 minutes exercise per week, heart healthy diet, and attaining healthy weight.The importance of adequate sleep also discussed. Regular seat belt use and safe storage  of firearms if patient has them, is also discussed. Changes in health habits are decided on by the patient with goals and time frames  set for achieving them. Immunization and cancer screening needs are specifically addressed at this visit.  

## 2014-03-21 NOTE — Assessment & Plan Note (Addendum)
mother reports need for assistance with cutting toenails safely, will  refer to podiatry

## 2014-03-25 ENCOUNTER — Telehealth: Payer: Self-pay | Admitting: Family Medicine

## 2014-03-29 ENCOUNTER — Telehealth: Payer: Self-pay | Admitting: Family Medicine

## 2014-03-29 ENCOUNTER — Other Ambulatory Visit: Payer: Self-pay | Admitting: Family Medicine

## 2014-03-29 NOTE — Telephone Encounter (Signed)
Fever and green phlegm from coughing. Appt made for tomorrow

## 2014-03-29 NOTE — Telephone Encounter (Signed)
Mother aware

## 2014-03-30 ENCOUNTER — Ambulatory Visit: Payer: Medicare Other | Admitting: Family Medicine

## 2014-04-06 ENCOUNTER — Encounter: Payer: Self-pay | Admitting: Family Medicine

## 2014-04-06 ENCOUNTER — Ambulatory Visit (INDEPENDENT_AMBULATORY_CARE_PROVIDER_SITE_OTHER): Payer: Medicaid Other | Admitting: Family Medicine

## 2014-04-06 VITALS — BP 120/84 | HR 95 | Resp 16 | Ht 72.0 in | Wt 345.1 lb

## 2014-04-06 DIAGNOSIS — R059 Cough, unspecified: Secondary | ICD-10-CM | POA: Insufficient documentation

## 2014-04-06 DIAGNOSIS — I10 Essential (primary) hypertension: Secondary | ICD-10-CM

## 2014-04-06 DIAGNOSIS — R05 Cough: Secondary | ICD-10-CM | POA: Insufficient documentation

## 2014-04-06 DIAGNOSIS — J209 Acute bronchitis, unspecified: Secondary | ICD-10-CM | POA: Diagnosis not present

## 2014-04-06 MED ORDER — AZITHROMYCIN 250 MG PO TABS: ORAL_TABLET | ORAL | Status: DC

## 2014-04-06 MED ORDER — BENZONATATE 100 MG PO CAPS: 100.0000 mg | ORAL_CAPSULE | Freq: Two times a day (BID) | ORAL | Status: DC | PRN

## 2014-04-06 NOTE — Patient Instructions (Signed)
F/u as before, call if you need me sooner   Return in 2 weeks for flu vaccine  You are being treated for acute bronchitis, 2 medications are sent in, take as directed please  Blood pressure today is very good

## 2014-04-11 NOTE — Progress Notes (Signed)
   Subjective:    Patient ID: Scott Mendez, male    DOB: Sep 05, 1980, 33 y.o.   MRN: [id number]  HPI 4 day  h/o excessive chest congestion, cough productive of thick cream sputum, no  fever but he has had  chills.Chest sore from excessive cough. Denies sinus pressure, nasal congestion or drainage,  ear pain or sore throat.     Review of Systems See HPI Mother is historian Denies  leg swelling Denies abdominal pain, nausea, vomiting,diarrhea or constipation.Normal appetite   Denies joint pain, swelling and limitation in mobility. Denies  seizures  Denies skin break down or rash.        Objective:   Physical Exam BP 120/84  Pulse 95  Resp 16  Ht 6' (1.829 m)  Wt 345 lb 1.9 oz (156.545 kg)  BMI 46.80 kg/m2  SpO2 97% Patient alert  and in no cardiopulmonary distress.  HEENT: No facial asymmetry, EOMI,   oropharynx pink and moist.  Neck supple no JVD, no mass.  Chest: Adequate air entry throughout, no wheezes, scattered crackles bibasilar  CVS: S1, S2 no murmurs, no S3.Regular rate.  ABD: Soft non tender.   Ext: No edema        Assessment & Plan:  Acute bronchitis Decongestant and antibiotic prescribed  HYPERTENSION Controlled, no change in medication

## 2014-04-11 NOTE — Assessment & Plan Note (Signed)
Controlled, no change in medication  

## 2014-04-11 NOTE — Assessment & Plan Note (Signed)
Decongestant and antibiotic prescribed 

## 2014-04-16 DIAGNOSIS — L608 Other nail disorders: Secondary | ICD-10-CM | POA: Diagnosis not present

## 2014-04-16 DIAGNOSIS — M79609 Pain in unspecified limb: Secondary | ICD-10-CM | POA: Diagnosis not present

## 2014-05-26 ENCOUNTER — Telehealth: Payer: Self-pay

## 2014-05-26 DIAGNOSIS — J069 Acute upper respiratory infection, unspecified: Secondary | ICD-10-CM | POA: Diagnosis not present

## 2014-05-26 DIAGNOSIS — J40 Bronchitis, not specified as acute or chronic: Secondary | ICD-10-CM | POA: Diagnosis not present

## 2014-05-26 NOTE — Telephone Encounter (Signed)
noted 

## 2014-05-26 NOTE — Telephone Encounter (Signed)
Patient has been having symptoms since Sunday with progression to fever.  Advised to see care at urgent care today.

## 2014-07-09 ENCOUNTER — Other Ambulatory Visit: Payer: Self-pay | Admitting: Family Medicine

## 2014-07-20 ENCOUNTER — Ambulatory Visit (INDEPENDENT_AMBULATORY_CARE_PROVIDER_SITE_OTHER): Payer: Medicare Other | Admitting: Family Medicine

## 2014-07-20 ENCOUNTER — Ambulatory Visit (INDEPENDENT_AMBULATORY_CARE_PROVIDER_SITE_OTHER): Payer: Medicare Other

## 2014-07-20 ENCOUNTER — Ambulatory Visit (HOSPITAL_COMMUNITY)
Admission: RE | Admit: 2014-07-20 | Discharge: 2014-07-20 | Disposition: A | Discharge status: Home / Self Care | Payer: Medicare Other | Source: Ambulatory Visit | Attending: Family Medicine | Admitting: Family Medicine

## 2014-07-20 ENCOUNTER — Encounter: Payer: Self-pay | Admitting: Family Medicine

## 2014-07-20 VITALS — BP 130/84 | HR 108 | Resp 16 | Ht 72.0 in | Wt 349.1 lb

## 2014-07-20 DIAGNOSIS — J302 Other seasonal allergic rhinitis: Secondary | ICD-10-CM

## 2014-07-20 DIAGNOSIS — Z23 Encounter for immunization: Secondary | ICD-10-CM

## 2014-07-20 DIAGNOSIS — R7301 Impaired fasting glucose: Secondary | ICD-10-CM | POA: Diagnosis not present

## 2014-07-20 DIAGNOSIS — R05 Cough: Secondary | ICD-10-CM

## 2014-07-20 DIAGNOSIS — R0989 Other specified symptoms and signs involving the circulatory and respiratory systems: Secondary | ICD-10-CM | POA: Diagnosis not present

## 2014-07-20 DIAGNOSIS — I10 Essential (primary) hypertension: Secondary | ICD-10-CM | POA: Insufficient documentation

## 2014-07-20 DIAGNOSIS — E739 Lactose intolerance, unspecified: Secondary | ICD-10-CM

## 2014-07-20 DIAGNOSIS — R059 Cough, unspecified: Secondary | ICD-10-CM

## 2014-07-20 MED ORDER — PREDNISONE (PAK) 5 MG PO TABS: 5.0000 mg | ORAL_TABLET | ORAL | Status: DC

## 2014-07-20 MED ORDER — PROMETHAZINE-DM 6.25-15 MG/5ML PO SYRP: ORAL_SOLUTION | ORAL | Status: DC

## 2014-07-20 MED ORDER — METHYLPREDNISOLONE ACETATE 80 MG/ML IJ SUSP
80.0000 mg | Freq: Once | INTRAMUSCULAR | Status: AC
  Administered 2014-07-20: 80 mg via INTRAMUSCULAR

## 2014-07-20 MED ORDER — PSEUDOEPHEDRINE HCL 60 MG PO TABS: ORAL_TABLET | ORAL | Status: DC

## 2014-07-20 NOTE — Assessment & Plan Note (Signed)
Controlled, no change in medication DASH diet and commitment to daily physical activity for a minimum of 30 minutes discussed and encouraged, as a part of hypertension management. The importance of attaining a healthy weight is also discussed.  

## 2014-07-20 NOTE — Assessment & Plan Note (Signed)
Vaccine administered at visit.  

## 2014-07-20 NOTE — Patient Instructions (Addendum)
F/U IN 4 MONTH, CALL IF YOU NEED ME BEFORE  DEPO MEDROL IN OFFICE FIOR ALLERGIES , PREDNISONE PRESCRIBED , AND cxr TODAY  TAKE SUDAFED ONE DAILY FOR NEXT 3 DAYS, THEN AS NEEDED    COUGH SUPPRESSANT MED PRESCRIBED AT NIGHT ALSO   fLU VACCINE TODAY   CXR TODAY PLEASE  fASTING CHEM 7 AND Hba1c IN 4 MONTH

## 2014-07-20 NOTE — Progress Notes (Signed)
   Subjective:    Patient ID: Scott Mendez, male    DOB: 1981/08/06, 33 y.o.   MRN: [id number]  HPI The PT is here for follow up and re-evaluation of chronic medical conditions, medication management and review of any available recent lab and radiology data.  Preventive health is updated, specifically  Cancer screening and Immunization.    The PT denies any adverse reactions to current medications since the last visit.  2 week h/o deep cough, on productive , no fever or chills, increased nasal congestion noted   Review of Systems See HPI History from Mom as pt incapable  Denies chest pains, palpitations and leg swelling Denies abdominal pain, nausea, vomiting,diarrhea or constipation.   Denies dysuria, frequency, hesitancy or incontinence. Denies joint pain, swelling and limitation in mobility. Denies headaches, seizures, numbness, or tingling. Denies depression, anxiety or insomnia. Denies skin break down or rash.        Objective:   Physical Exam BP 130/84 mmHg  Pulse 108  Resp 16  Ht 6' (1.829 m)  Wt 349 lb 1.9 oz (158.36 kg)  BMI 47.34 kg/m2  SpO2 96% Patient alert , and in no cardiopulmonary distress.  HEENT: No facial asymmetry, EOMI,   oropharynx pink and moist.  Neck supple no JVD, no mass. Nasal mucosa erythematous and edematous, no sinus tenderness, TM clear Chest: Clear to auscultation bilaterally.  CVS: S1, S2 no murmurs, no S3.Regular rate.  ABD: Soft non tender.   Ext: No edema  MS: Adequate ROM spine, shoulders, hips and knees.  Skin: Intact, no ulcerations or rash noted.  Psych: Good eye contact, not anxious or depressed appearing.  CNS: CN 2-12 intact, power,  normal throughout.no focal deficits noted.        Assessment & Plan:  Essential hypertension Controlled, no change in medication DASH diet and commitment to daily physical activity for a minimum of 30 minutes discussed and encouraged, as a part of hypertension  management. The importance of attaining a healthy weight is also discussed.   Need for prophylactic vaccination and inoculation against influenza Vaccine administered at visit.   Cough Chronic cough due to uncontrolled allergies, depo medrol in office followed by prednisone dose pack also cXR today  Seasonal allergies Increased and uncontrolled over the past 2 to 3 months, commitment to daily use of prescription meds is stressed also as needed sudafed for 3 to 5 days and saline nasal lyushes, tough pt not likely to co operate with the flushes  Morbid obesity Improved. Pt applauded on succesful weight loss through lifestyle change, and encouraged to continue same. Weight loss goal set for the next several months.

## 2014-08-01 NOTE — Assessment & Plan Note (Signed)
Increased and uncontrolled over the past 2 to 3 months, commitment to daily use of prescription meds is stressed also as needed sudafed for 3 to 5 days and saline nasal lyushes, tough pt not likely to co operate with the flushes

## 2014-08-01 NOTE — Assessment & Plan Note (Signed)
Improved. Pt applauded on succesful weight loss through lifestyle change, and encouraged to continue same. Weight loss goal set for the next several months.  

## 2014-08-01 NOTE — Assessment & Plan Note (Signed)
Chronic cough due to uncontrolled allergies, depo medrol in office followed by prednisone dose pack also cXR today

## 2014-08-12 ENCOUNTER — Other Ambulatory Visit: Payer: Self-pay | Admitting: Family Medicine

## 2014-09-02 ENCOUNTER — Encounter (HOSPITAL_COMMUNITY): Payer: Self-pay | Admitting: *Deleted

## 2014-09-03 ENCOUNTER — Ambulatory Visit (HOSPITAL_COMMUNITY): Payer: Medicare Other | Admitting: Certified Registered Nurse Anesthetist

## 2014-09-03 ENCOUNTER — Encounter (HOSPITAL_COMMUNITY)
Admission: RE | Disposition: A | Discharge status: Home / Self Care | Payer: Self-pay | Source: Ambulatory Visit | Attending: Oral Surgery

## 2014-09-03 ENCOUNTER — Encounter (HOSPITAL_COMMUNITY): Payer: Self-pay | Admitting: *Deleted

## 2014-09-03 ENCOUNTER — Ambulatory Visit (HOSPITAL_COMMUNITY)
Admission: RE | Admit: 2014-09-03 | Discharge: 2014-09-03 | Disposition: A | Discharge status: Home / Self Care | Payer: Medicare Other | Source: Ambulatory Visit | Attending: Oral Surgery | Admitting: Oral Surgery

## 2014-09-03 DIAGNOSIS — E119 Type 2 diabetes mellitus without complications: Secondary | ICD-10-CM | POA: Diagnosis not present

## 2014-09-03 DIAGNOSIS — F840 Autistic disorder: Secondary | ICD-10-CM | POA: Insufficient documentation

## 2014-09-03 DIAGNOSIS — K029 Dental caries, unspecified: Secondary | ICD-10-CM | POA: Diagnosis not present

## 2014-09-03 DIAGNOSIS — K047 Periapical abscess without sinus: Secondary | ICD-10-CM | POA: Diagnosis not present

## 2014-09-03 DIAGNOSIS — I10 Essential (primary) hypertension: Secondary | ICD-10-CM | POA: Diagnosis not present

## 2014-09-03 DIAGNOSIS — Z6841 Body Mass Index (BMI) 40.0 and over, adult: Secondary | ICD-10-CM | POA: Insufficient documentation

## 2014-09-03 DIAGNOSIS — F79 Unspecified intellectual disabilities: Secondary | ICD-10-CM | POA: Diagnosis not present

## 2014-09-03 DIAGNOSIS — E669 Obesity, unspecified: Secondary | ICD-10-CM | POA: Diagnosis not present

## 2014-09-03 DIAGNOSIS — K088 Other specified disorders of teeth and supporting structures: Secondary | ICD-10-CM | POA: Diagnosis not present

## 2014-09-03 HISTORY — PX: TOOTH EXTRACTION: SHX859

## 2014-09-03 LAB — BASIC METABOLIC PANEL
Anion gap: 6 (ref 5–15)
BUN: 19 mg/dL (ref 6–23)
CALCIUM: 9.3 mg/dL (ref 8.4–10.5)
CO2: 30 mmol/L (ref 19–32)
CREATININE: 0.96 mg/dL (ref 0.50–1.35)
Chloride: 105 mmol/L (ref 96–112)
GFR calc non Af Amer: >90 mL/min (ref >90)
GLUCOSE: 102 mg/dL — AB (ref 70–99)
POTASSIUM: 3.9 mmol/L (ref 3.5–5.1)
Sodium: 141 mmol/L (ref 135–145)

## 2014-09-03 LAB — CBC
HCT: 46.9 % (ref 39.0–52.0)
HEMOGLOBIN: 15.8 g/dL (ref 13.0–17.0)
MCH: 30.9 pg (ref 26.0–34.0)
MCHC: 33.7 g/dL (ref 30.0–36.0)
MCV: 91.8 fL (ref 78.0–100.0)
PLATELETS: 236 10*3/uL (ref 150–400)
RBC: 5.11 MIL/uL (ref 4.22–5.81)
RDW: 12.7 % (ref 11.5–15.5)
WBC: 9.4 10*3/uL (ref 4.0–10.5)

## 2014-09-03 SURGERY — EXTRACTION, TOOTH, MOLAR
Anesthesia: General | Site: Mouth | Laterality: Right

## 2014-09-03 MED ORDER — GLYCOPYRROLATE 0.2 MG/ML IJ SOLN
INTRAMUSCULAR | Status: AC
  Filled 2014-09-03: qty 3

## 2014-09-03 MED ORDER — FENTANYL CITRATE 0.05 MG/ML IJ SOLN
INTRAMUSCULAR | Status: DC | PRN
  Administered 2014-09-03: 50 ug via INTRAVENOUS

## 2014-09-03 MED ORDER — LIDOCAINE HCL (CARDIAC) 20 MG/ML IV SOLN
INTRAVENOUS | Status: AC
  Filled 2014-09-03: qty 5

## 2014-09-03 MED ORDER — OXYMETAZOLINE HCL 0.05 % NA SOLN
NASAL | Status: AC
  Filled 2014-09-03: qty 15

## 2014-09-03 MED ORDER — LIDOCAINE HCL (CARDIAC) 20 MG/ML IV SOLN
INTRAVENOUS | Status: DC | PRN
  Administered 2014-09-03: 100 mg via INTRAVENOUS

## 2014-09-03 MED ORDER — SUCCINYLCHOLINE CHLORIDE 20 MG/ML IJ SOLN
INTRAMUSCULAR | Status: AC
  Filled 2014-09-03: qty 1

## 2014-09-03 MED ORDER — ONDANSETRON HCL 4 MG/2ML IJ SOLN
INTRAMUSCULAR | Status: DC | PRN
  Administered 2014-09-03: 4 mg via INTRAVENOUS

## 2014-09-03 MED ORDER — LIDOCAINE-EPINEPHRINE 2 %-1:100000 IJ SOLN
INTRAMUSCULAR | Status: DC | PRN
  Administered 2014-09-03: 8 mL

## 2014-09-03 MED ORDER — ONDANSETRON HCL 4 MG/2ML IJ SOLN
INTRAMUSCULAR | Status: AC
  Filled 2014-09-03: qty 2

## 2014-09-03 MED ORDER — MIDAZOLAM HCL 2 MG/2ML IJ SOLN
INTRAMUSCULAR | Status: AC
  Filled 2014-09-03: qty 2

## 2014-09-03 MED ORDER — PROPOFOL 10 MG/ML IV BOLUS
INTRAVENOUS | Status: AC
  Filled 2014-09-03: qty 20

## 2014-09-03 MED ORDER — NEOSTIGMINE METHYLSULFATE 10 MG/10ML IV SOLN
INTRAVENOUS | Status: AC
Start: 2014-09-03 — End: 2014-09-03
  Filled 2014-09-03: qty 1

## 2014-09-03 MED ORDER — LIDOCAINE-EPINEPHRINE 2 %-1:100000 IJ SOLN
INTRAMUSCULAR | Status: AC
  Filled 2014-09-03: qty 1

## 2014-09-03 MED ORDER — PROPOFOL 10 MG/ML IV BOLUS
INTRAVENOUS | Status: DC | PRN
  Administered 2014-09-03: 200 mg via INTRAVENOUS

## 2014-09-03 MED ORDER — ROCURONIUM BROMIDE 50 MG/5ML IV SOLN
INTRAVENOUS | Status: AC
  Filled 2014-09-03: qty 1

## 2014-09-03 MED ORDER — ARTIFICIAL TEARS OP OINT
TOPICAL_OINTMENT | OPHTHALMIC | Status: AC
  Filled 2014-09-03: qty 3.5

## 2014-09-03 MED ORDER — HYDROCODONE-ACETAMINOPHEN 5-325 MG PO TABS: 1.0000 | ORAL_TABLET | Freq: Four times a day (QID) | ORAL | Status: DC | PRN

## 2014-09-03 MED ORDER — FENTANYL CITRATE 0.05 MG/ML IJ SOLN
INTRAMUSCULAR | Status: AC
  Filled 2014-09-03: qty 5

## 2014-09-03 MED ORDER — SUCCINYLCHOLINE CHLORIDE 20 MG/ML IJ SOLN
INTRAMUSCULAR | Status: DC | PRN
  Administered 2014-09-03: 120 mg via INTRAVENOUS

## 2014-09-03 MED ORDER — LACTATED RINGERS IV SOLN
INTRAVENOUS | Status: DC | PRN
  Administered 2014-09-03: 08:00:00 via INTRAVENOUS

## 2014-09-03 SURGICAL SUPPLY — 33 items
BLADE SURG 15 STRL LF DISP TIS (BLADE) ×1 IMPLANT
BLADE SURG 15 STRL SS (BLADE) ×2
BUR CROSS CUT (BURR)
BUR CROSS CUT FISSURE 1.6 (BURR) IMPLANT
BUR CROSS CUT FISSURE 1.6MM (BURR)
BUR SRG MED 1.6XXCUT FSSR (BURR) IMPLANT
BURR SRG MED 1.6XXCUT FSSR (BURR)
CANISTER SUCTION 2500CC (MISCELLANEOUS) ×3 IMPLANT
COVER SURGICAL LIGHT HANDLE (MISCELLANEOUS) ×3 IMPLANT
GAUZE PACKING FOLDED 2  STR (GAUZE/BANDAGES/DRESSINGS) ×2
GAUZE PACKING FOLDED 2 STR (GAUZE/BANDAGES/DRESSINGS) ×1 IMPLANT
GAUZE SPONGE 4X4 16PLY XRAY LF (GAUZE/BANDAGES/DRESSINGS) IMPLANT
GLOVE BIO SURGEON STRL SZ 6.5 (GLOVE) ×2 IMPLANT
GLOVE BIO SURGEON STRL SZ7.5 (GLOVE) ×3 IMPLANT
GLOVE BIO SURGEONS STRL SZ 6.5 (GLOVE) ×1
GLOVE BIOGEL PI IND STRL 7.0 (GLOVE) ×1 IMPLANT
GLOVE BIOGEL PI INDICATOR 7.0 (GLOVE) ×2
GOWN STRL REUS W/ TWL LRG LVL3 (GOWN DISPOSABLE) ×1 IMPLANT
GOWN STRL REUS W/ TWL XL LVL3 (GOWN DISPOSABLE) ×1 IMPLANT
GOWN STRL REUS W/TWL LRG LVL3 (GOWN DISPOSABLE) ×2
GOWN STRL REUS W/TWL XL LVL3 (GOWN DISPOSABLE) ×2
KIT BASIN OR (CUSTOM PROCEDURE TRAY) ×3 IMPLANT
KIT ROOM TURNOVER OR (KITS) ×3 IMPLANT
NEEDLE 22X1 1/2 (OR ONLY) (NEEDLE) ×3 IMPLANT
NS IRRIG 1000ML POUR BTL (IV SOLUTION) ×3 IMPLANT
PAD ARMBOARD 7.5X6 YLW CONV (MISCELLANEOUS) ×6 IMPLANT
SUT CHROMIC 3 0 PS 2 (SUTURE) IMPLANT
TOWEL OR 17X26 10 PK STRL BLUE (TOWEL DISPOSABLE) ×3 IMPLANT
TRAY ENT MC OR (CUSTOM PROCEDURE TRAY) ×3 IMPLANT
TUBE CONNECTING 12'X1/4 (SUCTIONS) ×1
TUBE CONNECTING 12X1/4 (SUCTIONS) ×2 IMPLANT
TUBING IRRIGATION (MISCELLANEOUS) IMPLANT
YANKAUER SUCT BULB TIP NO VENT (SUCTIONS) ×3 IMPLANT

## 2014-09-03 NOTE — Anesthesia Postprocedure Evaluation (Signed)
  Anesthesia Post-op Note  Patient: [name]  Procedure(s) Performed: Procedure(s): EXTRACTION MOLAR LOWER RIGHT (Right)  Patient Location: PACU  Anesthesia Type:General  Level of Consciousness: awake and alert   Airway and Oxygen Therapy: Patient Spontanous Breathing  Post-op Pain: mild  Post-op Assessment: Post-op Vital signs reviewed  Post-op Vital Signs: stable  Last Vitals:  Filed Vitals:   09/03/14 0915  BP: 156/77  Pulse: 96  Temp:   Resp:     Complications: No apparent anesthesia complications

## 2014-09-03 NOTE — Anesthesia Procedure Notes (Signed)
Procedure Name: Intubation Date/Time: 09/03/2014 8:09 AM Performed by: Angelica PouSMITH, Fischer Halley PIZZICARA Pre-anesthesia Checklist: Emergency Drugs available, Patient identified, Timeout performed, Suction available and Patient being monitored Patient Re-evaluated:Patient Re-evaluated prior to inductionOxygen Delivery Method: Circle system utilized Preoxygenation: Pre-oxygenation with 100% oxygen Intubation Type: IV induction and Rapid sequence Laryngoscope size: Large Adult Glidescope blade. Grade View: Grade I Tube type: Oral Tube size: 7.5 mm Number of attempts: 1 Airway Equipment and Method: Video-laryngoscopy and Rigid stylet Placement Confirmation: ETT inserted through vocal cords under direct vision,  breath sounds checked- equal and bilateral and positive ETCO2 Secured at: 22 cm Tube secured with: Tape Dental Injury: Teeth and Oropharynx as per pre-operative assessment  Difficulty Due To: Difficulty was anticipated

## 2014-09-03 NOTE — Transfer of Care (Signed)
Immediate Anesthesia Transfer of Care Note  Patient: Scott Mendez  Procedure(s) Performed: Procedure(s): EXTRACTION MOLAR LOWER RIGHT (Right)  Patient Location: PACU  Anesthesia Type:General  Level of Consciousness: awake, alert  and patient cooperative  Airway & Oxygen Therapy: Patient Spontanous Breathing and Patient connected to face mask oxygen  Post-op Assessment: Report given to RN, Post -op Vital signs reviewed and stable and Patient moving all extremities X 4  Post vital signs: Reviewed and stable  Last Vitals:  Filed Vitals:   09/03/14 0837  BP: 137/77  Pulse: 113  Temp: 36.3 C  Resp: 19    Complications: No apparent anesthesia complications

## 2014-09-03 NOTE — Anesthesia Preprocedure Evaluation (Addendum)
Anesthesia Evaluation  Patient identified by MRN, date of birth, ID band Patient awake    Reviewed: Allergy & Precautions, NPO status , Patient's Chart, lab work & pertinent test results  Airway Mallampati: II   Neck ROM: Full    Dental  (+) Teeth Intact   Pulmonary    + decreased breath sounds      Cardiovascular hypertension, Rhythm:Regular Rate:Normal     Neuro/Psych    GI/Hepatic   Endo/Other  diabetes  Renal/GU      Musculoskeletal   Abdominal (+) + obese,   Peds  Hematology   Anesthesia Other Findings   Reproductive/Obstetrics                            Anesthesia Physical Anesthesia Plan  ASA: III  Anesthesia Plan: General   Post-op Pain Management:    Induction: Intravenous  Airway Management Planned: Oral ETT and Nasal ETT  Additional Equipment:   Intra-op Plan:   Post-operative Plan:   Informed Consent: I have reviewed the patients History and Physical, chart, labs and discussed the procedure including the risks, benefits and alternatives for the proposed anesthesia with the patient or authorized representative who has indicated his/her understanding and acceptance.     Plan Discussed with:   Anesthesia Plan Comments:         Anesthesia Quick Evaluation

## 2014-09-03 NOTE — Op Note (Signed)
09/03/2014  8:21 AM  PATIENT:  Curlene DolphinJeffrey L Endicott  34 y.o. male  PRE-OPERATIVE DIAGNOSIS:  NON RESTORABLE TOOTH #31 POST-OPERATIVE DIAGNOSIS:  SAME  PROCEDURE:  Procedure(s): EXTRACTION MOLAR LOWER RIGHT #31  SURGEON:  Surgeon(s): Georgia LopesScott M Corey Laski, DDS  ANESTHESIA:   local and general  EBL:  minimal  DRAINS: none   SPECIMEN:  No Specimen  COUNTS:  YES  PLAN OF CARE: Discharge to home after PACU  PATIENT DISPOSITION:  PACU - hemodynamically stable.   PROCEDURE DETAILS: Dictation # [id number]  Georgia LopesScott M. Britne Borelli, DMD 09/03/2014 8:21 AM

## 2014-09-03 NOTE — H&P (Signed)
HISTORY AND PHYSICAL  Scott Mendez is a 34 y.o. male patient referred by general dentist for extraction.  No diagnosis found.  Past Medical History  Diagnosis Date  . Allergy   . Hypertension   . Mental retardation   . Autism     No current facility-administered medications for this encounter.   No Known Allergies Active Problems:   * No active hospital problems. *  Vitals: Blood pressure 128/77, pulse 94, temperature 98.6 F (37 C), temperature source Oral, resp. rate 18, height 5\' 11"  (1.803 Mendez), weight 157.398 kg (347 lb), SpO2 97 %. Lab results: Results for orders placed or performed during the hospital encounter of 09/03/14 (from the past 24 hour(s))  CBC     Status: None   Collection Time: 09/03/14  6:23 AM  Result Value Ref Range   WBC 9.4 4.0 - 10.5 K/uL   RBC 5.11 4.22 - 5.81 MIL/uL   Hemoglobin 15.8 13.0 - 17.0 g/dL   HCT 11.946.9 14.739.0 - 82.952.0 %   MCV 91.8 78.0 - 100.0 fL   MCH 30.9 26.0 - 34.0 pg   MCHC 33.7 30.0 - 36.0 g/dL   RDW 56.212.7 13.011.5 - 86.515.5 %   Platelets 236 150 - 400 K/uL   Radiology Results: No results found. General appearance: morbidly obese and slowed mentation Head: Normocephalic, without obvious abnormality, atraumatic Eyes: negative Nose: Nares normal. Septum midline. Mucosa normal. No drainage or sinus tenderness. Throat: lips, mucosa, and tongue normal; teeth and gums normal and gross dental caries tooth # 31. No edema, fluctuance, trismus.  Neck: no adenopathy, supple, symmetrical, trachea midline and thyroid not enlarged, symmetric, no tenderness/mass/nodules Resp: clear to auscultation bilaterally Cardio: regular rate and rhythm, S1, S2 normal, no murmur, click, rub or gallop  Assessment: 34 yo Autistic, HTN, MR uncooperative with abscessed tooth #31.  Plan: Extraction tooth #31. General anesthesia. Day surgery.  Scott Mendez,Scott Mendez 09/03/2014

## 2014-09-04 NOTE — Op Note (Signed)
NAMDarlin Drop:  Mendez, Scott          ACCOUNT NO.:  [id number]  MEDICAL RECORD NO.:  [id number]  LOCATION:  [facility]                         FACILITY:  [facility]  PHYSICIAN:  Georgia LopesScott M. Bryanah Sidell, M.D.  DATE OF BIRTH:  04/22/1981  DATE OF PROCEDURE:  09/03/2014 DATE OF DISCHARGE:  09/03/2014                              OPERATIVE REPORT   PREOPERATIVE DIAGNOSIS:  Nonrestorable tooth #31.  POSTOPERATIVE DIAGNOSIS:  Nonrestorable tooth #31.  PROCEDURE:  Examination under anesthesia.  Extraction of tooth #31.  SURGEON:  Georgia LopesScott M. Nelvin Tomb, M.D.  ANESTHESIA:  General oral intubation, Dr. Jacklynn BueMassagee attending.  INDICATIONS FOR PROCEDURE:  [name] is a 34 year old morbidly obese male with history of autism and mental retardation who is referred by his general dentist for removal of nonrestorable tooth.  The patient was unable to be operated on in the office due to uncooperativeness. Because of that, the [facility] where general anesthesia and airway protection could be utilized.  PROCEDURE:  The patient was taken to the operating room, placed on the table in a supine position.  General anesthesia was administered intravenously and an oral tube was placed and secured.  The eyes were protected and the patient was draped for the procedure and then time-out was performed.  The posterior pharynx was suctioned and the throat pack was placed.  Then, 2% lidocaine with 1:100,000 epinephrine was infiltrated in an inferior alveolar block of the right mandible and in buccal and lingual infiltration around tooth #31.  The 15 blade was used to make an incision around this tooth.  The periosteum was reflected with a periosteal elevator.  The tooth was elevated with a 301 elevator and removed from the mouth with the rongeurs.  Sockets were curetted and then irrigated and no suture was needed.  The oral cavity was then inspected.  There were no other lesions, caries, or concerns.   The throat pack was removed and the patient was awakened, extubated, taken to the recovery room breathing spontaneously in good condition.  ESTIMATED BLOOD LOSS:  Minimal.  COMPLICATIONS:  None.  SPECIMENS:  None.     Georgia LopesScott M. Melba Araki, M.D.     SMJ/MEDQ  D:  09/03/2014  T:  09/04/2014  Job:  [id number]

## 2014-09-06 ENCOUNTER — Encounter (HOSPITAL_COMMUNITY): Payer: Self-pay | Admitting: Oral Surgery

## 2014-10-15 ENCOUNTER — Other Ambulatory Visit: Payer: Self-pay | Admitting: Family Medicine

## 2014-11-09 ENCOUNTER — Ambulatory Visit: Payer: Medicare Other | Admitting: Family Medicine

## 2014-11-16 ENCOUNTER — Telehealth: Payer: Self-pay | Admitting: *Deleted

## 2014-11-16 NOTE — Telephone Encounter (Signed)
Mother called stating that patient needs a letter for the [employer] stating medical conditions.  Letter composed and mother will pick up today after lunch.

## 2014-11-16 NOTE — Telephone Encounter (Signed)
Male called requesting paper work stating what is wrong with the patient. Male states she needs it by tomorrow. Please advise [phone].

## 2014-12-01 ENCOUNTER — Encounter: Payer: Self-pay | Admitting: Family Medicine

## 2014-12-01 ENCOUNTER — Ambulatory Visit (INDEPENDENT_AMBULATORY_CARE_PROVIDER_SITE_OTHER): Payer: Medicare Other | Admitting: Family Medicine

## 2014-12-01 ENCOUNTER — Ambulatory Visit: Payer: Self-pay | Admitting: Family Medicine

## 2014-12-01 VITALS — BP 148/88 | HR 100 | Resp 18 | Ht 73.0 in | Wt 340.1 lb

## 2014-12-01 DIAGNOSIS — B351 Tinea unguium: Secondary | ICD-10-CM | POA: Diagnosis not present

## 2014-12-01 DIAGNOSIS — R05 Cough: Secondary | ICD-10-CM | POA: Diagnosis not present

## 2014-12-01 DIAGNOSIS — G4700 Insomnia, unspecified: Secondary | ICD-10-CM | POA: Diagnosis not present

## 2014-12-01 DIAGNOSIS — J302 Other seasonal allergic rhinitis: Secondary | ICD-10-CM | POA: Diagnosis not present

## 2014-12-01 DIAGNOSIS — R7301 Impaired fasting glucose: Secondary | ICD-10-CM

## 2014-12-01 DIAGNOSIS — R059 Cough, unspecified: Secondary | ICD-10-CM

## 2014-12-01 DIAGNOSIS — L600 Ingrowing nail: Secondary | ICD-10-CM

## 2014-12-01 DIAGNOSIS — E739 Lactose intolerance, unspecified: Secondary | ICD-10-CM

## 2014-12-01 DIAGNOSIS — I10 Essential (primary) hypertension: Secondary | ICD-10-CM

## 2014-12-01 MED ORDER — PREDNISONE 5 MG PO TABS: 5.0000 mg | ORAL_TABLET | Freq: Two times a day (BID) | ORAL | Status: AC

## 2014-12-01 MED ORDER — TERBINAFINE HCL 250 MG PO TABS: 250.0000 mg | ORAL_TABLET | Freq: Every day | ORAL | Status: DC

## 2014-12-01 MED ORDER — TEMAZEPAM 15 MG PO CAPS: 15.0000 mg | ORAL_CAPSULE | Freq: Every day | ORAL | Status: DC

## 2014-12-01 MED ORDER — METHYLPREDNISOLONE ACETATE 80 MG/ML IJ SUSP
80.0000 mg | Freq: Once | INTRAMUSCULAR | Status: AC
  Administered 2014-12-01: 80 mg via INTRAMUSCULAR

## 2014-12-01 MED ORDER — PHENYLEPHRINE-CHLORPHEN-DM 3.5-1-3 MG/ML PO LIQD: 1.0000 mL | Freq: Four times a day (QID) | ORAL | Status: DC | PRN

## 2014-12-01 MED ORDER — PREDNISONE 5 MG (21) PO TBPK: 5.0000 mg | ORAL_TABLET | ORAL | Status: DC

## 2014-12-01 NOTE — Patient Instructions (Signed)
Annual  Physical exam in early August, call if you need me before  Prednisone and decongestant syrup prescribed for allergies and t you will get depo medrol 80 mg Im in office today also  New to help with sleep is restoril  Use tylenol for pain in toes if needed, no prescription pain med indicated at this time  Fasting lipid, cmp and EGFr, HBA1C and TSH  7/31/or after  [facility] you feel better soon!  Thanks for choosing [facility], we consider it a privelige to serve you.

## 2015-01-12 NOTE — Assessment & Plan Note (Signed)
Uncontrolled, depo medrol followed by short course of prednisone

## 2015-01-12 NOTE — Assessment & Plan Note (Signed)
Elevated at visit but pt  Has been on multiple OTC meds for this, no med change DASH diet and commitment to daily physical activity for a minimum of 30 minutes discussed and encouraged, as a part of hypertension management. The importance of attaining a healthy weight is also discussed.  BP/Weight 12/01/2014 09/03/2014 07/20/2014 04/06/2014 03/17/2014 10/20/2013 05/18/2013  Systolic BP 148 156 130 120 122 128 128  Diastolic BP 88 77 84 84 84 84 82  Wt. (Lbs) 340.08 347 349.12 345.12 345.12 351.12 348.8  BMI 44.88 48.42 47.34 46.8 46.8 47.61 47.3  Some encounter information is confidential and restricted. Go to Review Flowsheets activity to see all data.

## 2015-01-12 NOTE — Assessment & Plan Note (Signed)
Improved. re-educated about  the importance of commitment to a  minimum of 150 minutes of exercise per week.  The importance of healthy food choices with portion control discussed. Encouraged to start a food diary, count calories and to consider  joining a support group. Sample diet sheets offered. Goals set by the patient for the next several months.   Weight /BMI 12/01/2014 09/03/2014 07/20/2014  WEIGHT 340 lb 1.3 oz 347 lb 349 lb 1.9 oz  HEIGHT 6\' 1"  5\' 11"  6\' 0"   BMI 44.88 kg/m2 48.42 kg/m2 47.34 kg/m2  Some encounter information is confidential and restricted. Go to Review Flowsheets activity to see all data.    Current exercise per week 90 minutes.

## 2015-01-12 NOTE — Assessment & Plan Note (Signed)
Symptomatic medication prescribed

## 2015-01-12 NOTE — Progress Notes (Signed)
Subjective:    Patient ID: Scott Mendez, male    DOB: [birth year], 34 y.o.   MRN: [id number]  HPI The PT is here for follow up and re-evaluation of chronic medical conditions, medication management and review of any available recent lab and radiology data.  Preventive health is updated, specifically  Immunization.    The PT denies any adverse reactions to current medications since the last visit.  C/o increased head and nasal congestion with excess cough , watery nasal drainage , no fever, chills or purulent sputum, mom has been using excess OTC products  tReview of Systems See HPI History from Mother, aphasic Denies recent fever or chills.  Denies chest pains, palpitations and leg swelling Denies abdominal pain, nausea, vomiting,diarrhea or constipation.   Denies dysuria, frequency, hesitancy or incontinence. Denies joint pain, swelling and limitation in mobility. Denies headaches, seizures, numbness, or tingling. Denies depression, anxiety or insomnia. Denies skin break down or rash.          Objective:   Physical Exam BP 148/88 mmHg  Pulse 100  Resp 18  Ht  (1.854 m)  Wt 340 lb 1.3 oz (154.259 kg)  BMI 44.88 kg/m2  SpO2 97% Patient alert and oriented and in no cardiopulmonary distress.  HEENT: No facial asymmetry, EOMI,   oropharynx pink and moist.  Neck supple no JVD, no mass. Bilateral conjunctival injection, erythema of nasal mucosa with watery drainage, TM clear Chest: Clear to auscultation bilaterally.  CVS: S1, S2 no murmurs, no S3.Regular rate.  ABD: Soft non tender.   Ext: No edema  MS: Adequate ROM spine, shoulders, hips and knees.  Skin: Intact, no ulcerations or rash noted.Bilateral onychomycosis Psych: Good eye contact, normal affect. Memory intact not anxious or depressed appearing.  CNS: CN 2-12 intact, power,  normal throughout.no focal deficits noted.        Assessment & Plan:  Seasonal allergies Uncontrolled, depo  medrol followed by short course of prednisone  Essential hypertension Elevated at visit but pt  Has been on multiple OTC meds for this, no med change DASH diet and commitment to daily physical activity for a minimum of 30 minutes discussed and encouraged, as a part of hypertension management. The importance of attaining a healthy weight is also discussed.  BP/Weight 12/01/2014 09/03/2014 07/20/2014 04/06/2014 03/17/2014 10/20/2013 05/18/2013  Systolic BP 148 156 130 120 122 128 128  Diastolic BP 88 77 84 84 84 84 82  Wt. (Lbs) 340.08 347 349.12 345.12 345.12 351.12 348.8  BMI 44.88 48.42 47.34 46.8 46.8 47.61 47.3  Some encounter information is confidential and restricted. Go to Review Flowsheets activity to see all data.        Morbid obesity Improved. re-educated about  the importance of commitment to a  minimum of 150 minutes of exercise per week.  The importance of healthy food choices with portion control discussed. Encouraged to start a food diary, count calories and to consider  joining a support group. Sample diet sheets offered. Goals set by the patient for the next several months.   Weight /BMI 12/01/2014 09/03/2014 07/20/2014  WEIGHT 340 lb 1.3 oz 347 lb 349 lb 1.9 oz  HEIGHT     BMI 44.88 kg/m2 48.42 kg/m2 47.34 kg/m2  Some encounter information is confidential and restricted. Go to Review Flowsheets activity to see all data.    Current exercise per week 90 minutes.   Insomnia Sleep hygiene reviewed and written information offered also. Prescription sent for  medication needed.   Onychomycosis with ingrown toenail 12 week course of oral med prescribed, new c/o toe pain, nails still very thickened  Cough Symptomatic medication prescribed

## 2015-01-12 NOTE — Assessment & Plan Note (Signed)
Sleep hygiene reviewed and written information offered also. Prescription sent for  medication needed.  

## 2015-01-12 NOTE — Assessment & Plan Note (Signed)
12 week course of oral med prescribed, new c/o toe pain, nails still very thickened

## 2015-01-28 ENCOUNTER — Other Ambulatory Visit: Payer: Self-pay | Admitting: Family Medicine

## 2015-03-04 ENCOUNTER — Other Ambulatory Visit: Payer: Self-pay | Admitting: Family Medicine

## 2015-03-14 DIAGNOSIS — E739 Lactose intolerance, unspecified: Secondary | ICD-10-CM | POA: Diagnosis not present

## 2015-03-14 DIAGNOSIS — R7301 Impaired fasting glucose: Secondary | ICD-10-CM | POA: Diagnosis not present

## 2015-03-14 DIAGNOSIS — I10 Essential (primary) hypertension: Secondary | ICD-10-CM | POA: Diagnosis not present

## 2015-03-15 LAB — COMPLETE METABOLIC PANEL WITH GFR
ALBUMIN: 4.3 g/dL (ref 3.6–5.1)
ALK PHOS: 79 U/L (ref 40–115)
ALT: 34 U/L (ref 9–46)
AST: 26 U/L (ref 10–40)
BILIRUBIN TOTAL: 0.6 mg/dL (ref 0.2–1.2)
BUN: 23 mg/dL (ref 7–25)
CO2: 27 mmol/L (ref 20–31)
Calcium: 9.7 mg/dL (ref 8.6–10.3)
Chloride: 102 mmol/L (ref 98–110)
Creat: 0.94 mg/dL (ref 0.60–1.35)
GFR, Est African American: >89 mL/min (ref >=60)
Glucose, Bld: 89 mg/dL (ref 65–99)
Potassium: 3.8 mmol/L (ref 3.5–5.3)
SODIUM: 141 mmol/L (ref 135–146)
Total Protein: 6.9 g/dL (ref 6.1–8.1)

## 2015-03-15 LAB — LIPID PANEL
Cholesterol: 143 mg/dL (ref 125–200)
HDL: 41 mg/dL (ref >=40)
LDL Cholesterol: 83 mg/dL (ref <130)
Total CHOL/HDL Ratio: 3.5 Ratio (ref <=5.0)
Triglycerides: 93 mg/dL (ref <150)
VLDL: 19 mg/dL (ref <30)

## 2015-03-15 LAB — HEMOGLOBIN A1C
HEMOGLOBIN A1C: 5.5 % (ref <5.7)
Mean Plasma Glucose: 111 mg/dL (ref <117)

## 2015-03-15 LAB — TSH: TSH: 1.954 u[IU]/mL (ref 0.350–4.500)

## 2015-03-22 ENCOUNTER — Encounter: Payer: Self-pay | Admitting: Family Medicine

## 2015-03-22 ENCOUNTER — Encounter: Payer: Self-pay | Admitting: *Deleted

## 2015-04-04 ENCOUNTER — Encounter: Payer: Self-pay | Admitting: Family Medicine

## 2015-04-04 ENCOUNTER — Ambulatory Visit (INDEPENDENT_AMBULATORY_CARE_PROVIDER_SITE_OTHER): Payer: Medicare Other | Admitting: Family Medicine

## 2015-04-04 VITALS — BP 130/88 | HR 98 | Resp 16 | Ht 73.0 in | Wt 335.0 lb

## 2015-04-04 DIAGNOSIS — Z23 Encounter for immunization: Secondary | ICD-10-CM | POA: Insufficient documentation

## 2015-04-04 DIAGNOSIS — Z0000 Encounter for general adult medical examination without abnormal findings: Secondary | ICD-10-CM

## 2015-04-04 DIAGNOSIS — G4700 Insomnia, unspecified: Secondary | ICD-10-CM

## 2015-04-04 MED ORDER — TEMAZEPAM 30 MG PO CAPS: 30.0000 mg | ORAL_CAPSULE | Freq: Every evening | ORAL | Status: DC | PRN

## 2015-04-04 NOTE — Assessment & Plan Note (Signed)
After obtaining informed consent, the vaccine is  administered by LPN.  

## 2015-04-04 NOTE — Assessment & Plan Note (Signed)
Increase in dose of medcation due to poor sleep

## 2015-04-04 NOTE — Patient Instructions (Addendum)
F/U in early February , call if you need me before  Excellent labs and blood pressure  Flu vaccine today  Letter stating that you were living with your Mom the year of 2015, Mom is Connar Keating  Temazepam has been increased to 30 mg at bedtime. Ok to take 2 of the  pills at bedtime until you run out

## 2015-04-04 NOTE — Progress Notes (Signed)
Subjective:    Patient ID: Scott Mendez, male    DOB: 03-21-1981, 34 y.o.   MRN: [id number]  HPI Preventive Screening-Counseling & Management   Patient present here today for a Medicare annual wellness visit.   Current Problems (verified)   Medications Prior to Visit Allergies (verified)   PAST HISTORY  Family History    Social History Mental retardation. Lives with mother who cares for him , never alcohol, nicotine, drug use    Risk Factors  Current exercise habits:  Goes to the [facility] center 4 days a week   Dietary issues discussed: Heart healthy, increase fruits and vegetables and reduce carbs     Cardiac risk factors: none   Depression Screen  (Note: if answer to either of the following is "Yes", a more complete depression screening is indicated)   Over the past two weeks, have you felt down, depressed or hopeless? No  Over the past two weeks, have you felt little interest or pleasure in doing things? No  Have you lost interest or pleasure in daily life? No  Do you often feel hopeless? No  Do you cry easily over simple problems? No   Activities of Daily Living  In your present state of health, do you have any difficulty performing the following activities? Incapable of all ADL"s independently due to mental retardation Driving?: doesn't drive,incapable  Managing money?: mother handles finances  Feeding yourself?:No Getting from bed to chair?:No Climbing a flight of stairs?:No Preparing food and eating?:yes Bathing or showering?:yes Getting dressed?: Getting to the toilet?Yes Moving around from place to place?: No  Fall Risk Assessment In the past year have you fallen or had a near fall?:No Are you currently taking any medications that make you dizzy?:No   Hearing Difficulties: Not apparent , unable to directly assess as pt cannot communicate Do you often ask people to speak up or repeat themselves?:N/A Do you experience ringing or noises in your  ears?:N/A Do you have difficulty understanding soft or whispered voices? Mom feels he has no hearing problems  Cognitive Testing  Alert? Yes Normal Appearance?Yes  Oriented to person? Yes Place? Yes  Time? Yes  Displays appropriate judgment?Yes  Can read the correct time from a watch face? Yes, mother states he can but isn't verbal , wakes up every morning and gets going in time for the  center Are you having problems remembering things?No  Advanced Directives have been discussed with the patient?Yes, full code per mother    List the Names of Other Physician/Practitioners you currently use:  Dr Orson Aloe (psych)   Indicate any recent Medical Services you may have received from other than [facility] providers in the past year (date may be approximate).   Assessment:    Annual Wellness Exam   Plan:    Patient Instructions (the written plan) was given to the patient.  Medicare Attestation  I have personally reviewed:  The patient's medical and social history  Their use of alcohol, tobacco or illicit drugs  Their current medications and supplements  The patient's functional ability including ADLs,fall risks, home safety risks, cognitive, and hearing and visual impairment  Diet and physical activities  Evidence for depression or mood disorders  The patient's weight, height, BMI, and visual acuity have been recorded in the chart. I have made referrals, counseling, and provided education to the patient based on review of the above and I have provided the patient with a written personalized care plan for preventive services.  Review of Systems     Objective:   Physical Exam BP 130/88 mmHg  Pulse 98  Resp 16  Ht 6\' 1"  (1.854 m)  Wt 335 lb (151.955 kg)  BMI 44.21 kg/m2  SpO2 96%        Assessment & Plan:  Medicare annual wellness visit, subsequent Annual exam as documented. Counseling done  re healthy lifestyle involving commitment to 150 minutes exercise per week,  heart healthy diet, and attaining healthy weight.The importance of adequate sleep also discussed. Regular seat belt use and home safety, is also discussed. Changes in health habits are decided on by the patient with goals and time frames  set for achieving them. Immunization and cancer screening needs are specifically addressed at this visit.   Influenza vaccine administered After obtaining informed consent, the vaccine is  administered by LPN.   Need for prophylactic vaccination and inoculation against influenza After obtaining informed consent, the vaccine is  administered by LPN.   Insomnia Increase in dose of medcation due to poor sleep

## 2015-04-04 NOTE — Assessment & Plan Note (Signed)

## 2015-05-05 ENCOUNTER — Other Ambulatory Visit: Payer: Self-pay | Admitting: Family Medicine

## 2015-06-06 ENCOUNTER — Other Ambulatory Visit: Payer: Self-pay | Admitting: Family Medicine

## 2015-07-31 IMAGING — CR DG CHEST 2V
2 series · 2 of 2 positions shown · non-contrast
Comparison: PA and lateral chest of December 22, 2012

CLINICAL DATA: Cough and congestion for the past 2 weeks; history
of hypertension

EXAM:
CHEST  2 VIEW

[view not recorded (1 of 2)]
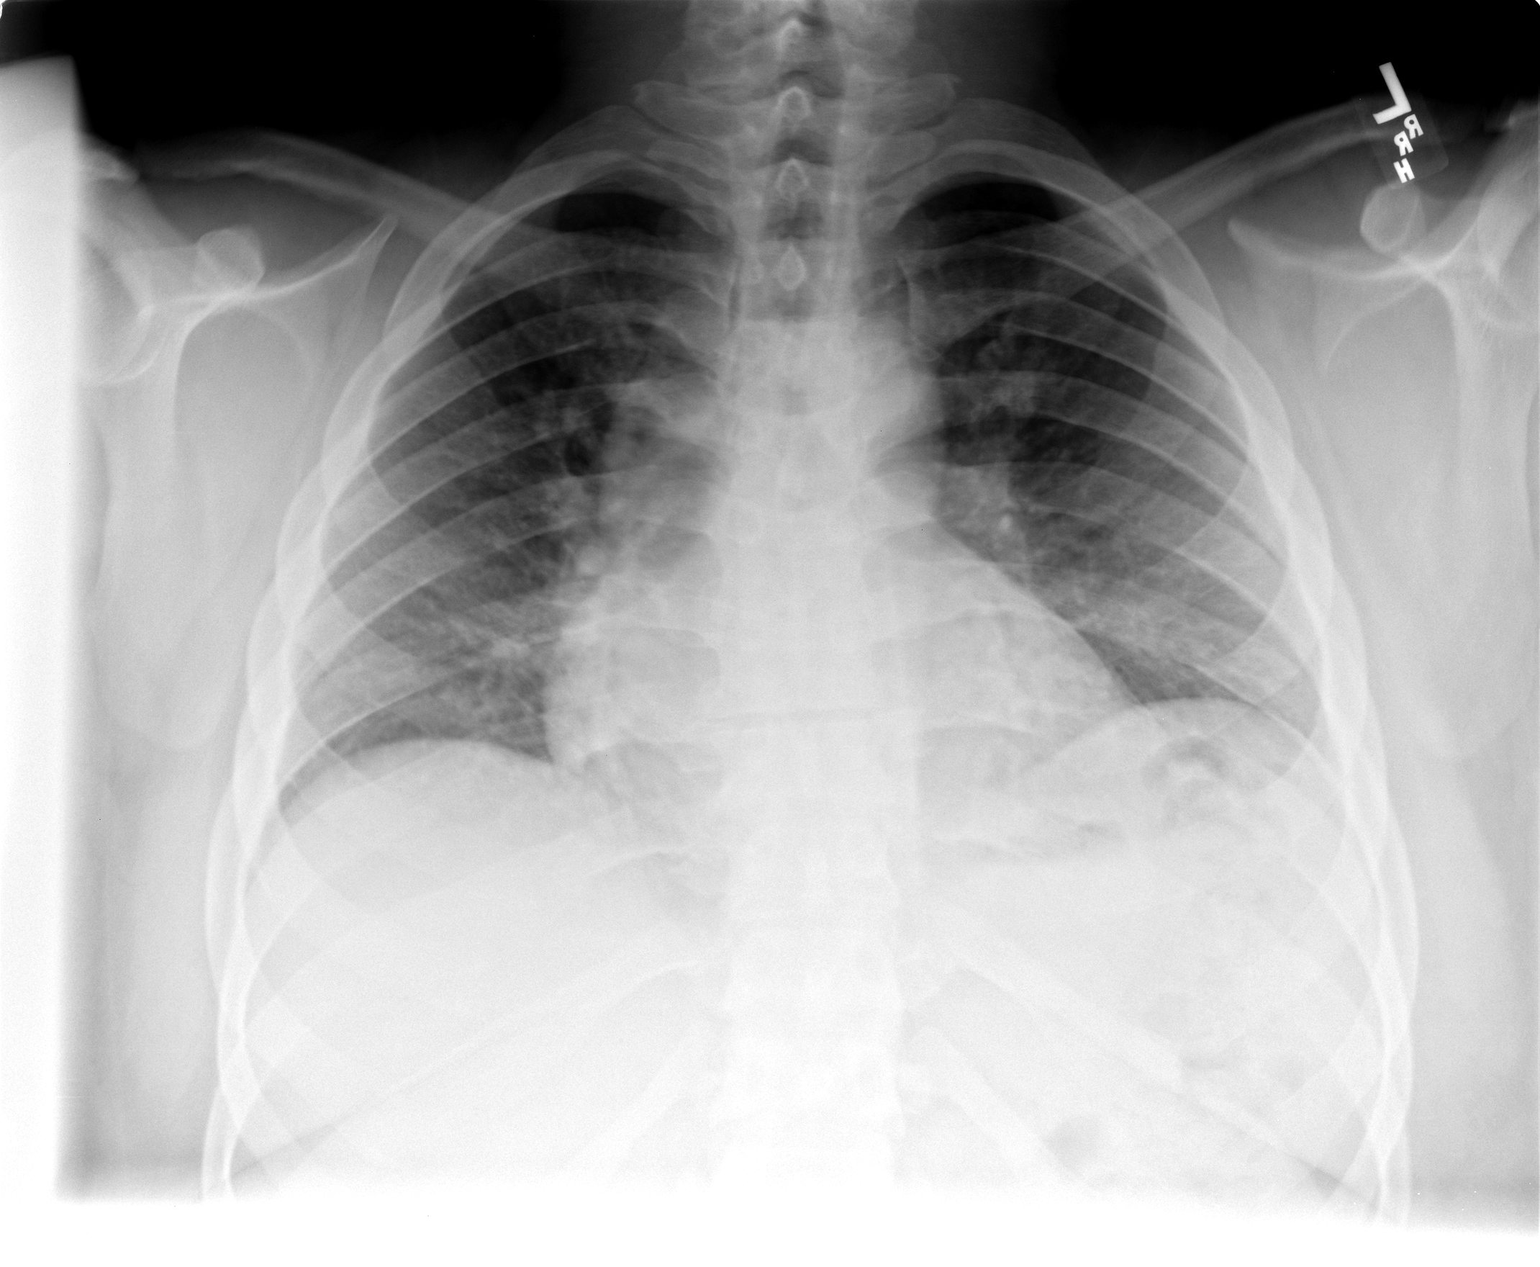

[view not recorded (2 of 2)]
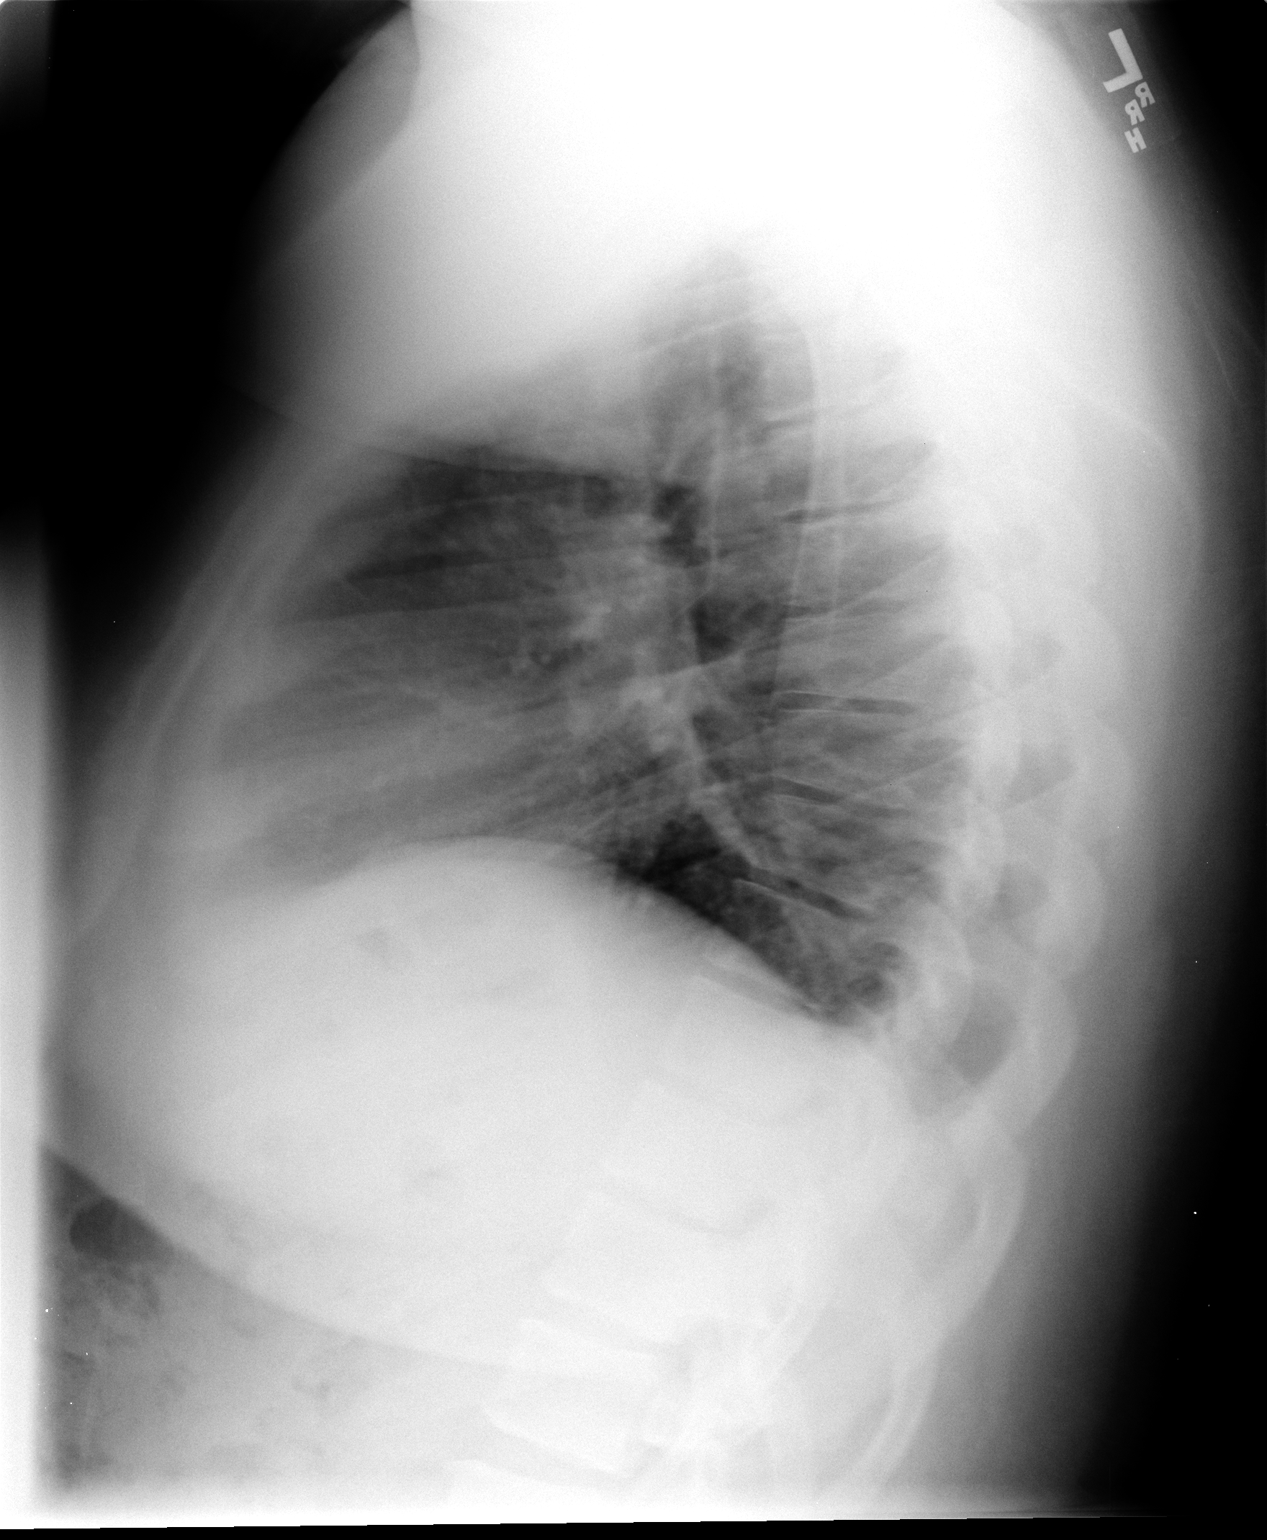

[2 of 2 positions shown; findings below may reference images not displayed]

FINDINGS: The lungs are mildly hypoinflated. There is no focal infiltrate.
There is no pleural effusion. The cardiac silhouette is top-normal
in size but stable. The pulmonary vascularity is not engorged. The
bony thorax is unremarkable.
IMPRESSION: The study is limited due to hypoinflation. There is no definite
evidence of pneumonia nor other acute cardiopulmonary abnormality.

## 2015-09-09 ENCOUNTER — Other Ambulatory Visit: Payer: Self-pay | Admitting: Family Medicine

## 2015-09-09 DIAGNOSIS — R7301 Impaired fasting glucose: Secondary | ICD-10-CM | POA: Diagnosis not present

## 2015-09-09 LAB — BASIC METABOLIC PANEL
BUN: 18 mg/dL (ref 7–25)
CHLORIDE: 104 mmol/L (ref 98–110)
CO2: 29 mmol/L (ref 20–31)
Calcium: 9.1 mg/dL (ref 8.6–10.3)
Creat: 0.92 mg/dL (ref 0.60–1.35)
Glucose, Bld: 82 mg/dL (ref 65–99)
POTASSIUM: 3.8 mmol/L (ref 3.5–5.3)
Sodium: 140 mmol/L (ref 135–146)

## 2015-09-09 LAB — HEMOGLOBIN A1C
HEMOGLOBIN A1C: 5.5 % (ref <5.7)
Mean Plasma Glucose: 111 mg/dL (ref <117)

## 2015-09-13 ENCOUNTER — Ambulatory Visit (INDEPENDENT_AMBULATORY_CARE_PROVIDER_SITE_OTHER): Payer: Medicare Other | Admitting: Family Medicine

## 2015-09-13 ENCOUNTER — Encounter: Payer: Self-pay | Admitting: Family Medicine

## 2015-09-13 VITALS — BP 124/84 | HR 98 | Resp 16 | Ht 73.0 in | Wt 333.1 lb

## 2015-09-13 DIAGNOSIS — I10 Essential (primary) hypertension: Secondary | ICD-10-CM | POA: Diagnosis not present

## 2015-09-13 DIAGNOSIS — Z13220 Encounter for screening for lipoid disorders: Secondary | ICD-10-CM | POA: Diagnosis not present

## 2015-09-13 DIAGNOSIS — E739 Lactose intolerance, unspecified: Secondary | ICD-10-CM | POA: Diagnosis not present

## 2015-09-13 DIAGNOSIS — J302 Other seasonal allergic rhinitis: Secondary | ICD-10-CM | POA: Diagnosis not present

## 2015-09-13 MED ORDER — FLUTICASONE FUROATE 27.5 MCG/SPRAY NA SUSP: 2.0000 | Freq: Every day | NASAL | Status: DC

## 2015-09-13 NOTE — Assessment & Plan Note (Signed)
Controlled, no change in medication DASH diet and commitment to daily physical activity for a minimum of 30 minutes discussed and encouraged, as a part of hypertension management. The importance of attaining a healthy weight is also discussed.  BP/Weight 09/13/2015 04/04/2015 12/01/2014 09/03/2014 07/20/2014 04/06/2014 03/17/2014  Systolic BP 124 130 148 156 130 120 122  Diastolic BP 84 88 88 77 84 84 84  Wt. (Lbs) 333.12 335 340.08 347 349.12 345.12 345.12  BMI 43.96 44.21 44.88 48.42 47.34 46.8 46.8  Some encounter information is confidential and restricted. Go to Review Flowsheets activity to see all data.

## 2015-09-13 NOTE — Progress Notes (Signed)
Subjective:    Patient ID: Scott Mendez, male    DOB: December 01, 1980, 35 y.o.   MRN: [id number]  HPI    Scott Mendez     MRN: [id number]      DOB: 1980/10/13   HPI Scott Mendez is here for follow up and re-evaluation of chronic medical conditions, medication management and review of any available recent lab and radiology data.  Preventive health is updated, specifically   Immunization.   History from  Mother, pt is incapable due to autism. Denies any adverse reactions to current medications since the last visit.  Increased clear nasal drainage in past 2 weeks  C/o seeming deformity and out turning of right foot, concerned about the ankle, but Scott Mendez does not c/o pain and has no apparent gait instability  ROS Denies recent fever or chills. Denies chest congestion, productive cough or wheezing. Denies PND , orthopnea and leg swelling Denies abdominal pain, nausea, vomiting,diarrhea or constipation.   Denies  incontinence. .  Denies uncontrolled , anxiety or insomnia. Denies skin break down or rash.   PE  BP 124/84 mmHg  Pulse 98  Resp 16  Ht  (1.854 m)  Wt 333 lb 1.9 oz (151.102 kg)  BMI 43.96 kg/m2  SpO2 96%  Patient alert and  in no cardiopulmonary distress.  HEENT: No facial asymmetry, EOMI,   oropharynx pink and moist.  Neck supple no JVD, no mass. No sinus tenderness. Nasal mucosa erythematous and edematous, TM clear Chest: Clear to auscultation bilaterally.  CVS: S1, S2 no murmurs, no S3.Regular rate.  ABD: Soft non tender.   Ext: No edema  MS: Adequate ROM spine, shoulders, hips and knees.decreased ROM right ankle which appears deformed, foot everted on right  Skin: Intact, no ulcerations or rash noted.  Psych: Good eye contact,  not anxious or depressed appearing.  CNS:  power,  normal throughout.no focal deficits noted.   Assessment & Plan   Essential hypertension Controlled, no change in medication DASH diet and  commitment to daily physical activity for a minimum of 30 minutes discussed and encouraged, as a part of hypertension management. The importance of attaining a healthy weight is also discussed.  BP/Weight 09/13/2015 04/04/2015 12/01/2014 09/03/2014 07/20/2014 04/06/2014 03/17/2014  Systolic BP 124 130 148 156 130 120 122  Diastolic BP 84 88 88 77 84 84 84  Wt. (Lbs) 333.12 335 340.08 347 349.12 345.12 345.12  BMI 43.96 44.21 44.88 48.42 47.34 46.8 46.8  Some encounter information is confidential and restricted. Go to Review Flowsheets activity to see all data.        Seasonal allergies Uncontrolled , add veramyst daily  IMPAIRED GLUCOSE TOLERANCE Patient educated about the importance of limiting  Carbohydrate intake , the need to commit to daily physical activity for a minimum of 30 minutes , and to commit weight loss. The fact that changes in all these areas will reduce or eliminate all together the development of diabetes is stressed.   Diabetic Labs Latest Ref Rng 09/09/2015 03/14/2015 09/03/2014 03/05/2014 10/16/2013  HbA1c <5.7 % 5.5 5.5 - - 5.6  Chol 125 - 200 mg/dL - 284 - 132 -  HDL >=44 mg/dL - 41 - 01(U) -  Calc LDL <130 mg/dL - 83 - 86 -  Triglycerides <150 mg/dL - 93 - 93 -  Creatinine 0.60 - 1.35 mg/dL 2.72 5.36 6.44 0.34 7.42   BP/Weight 09/13/2015 04/04/2015 12/01/2014 09/03/2014 07/20/2014 04/06/2014 03/17/2014  Systolic BP 124 130 148 156 130  120 122  Diastolic BP 84 88 88 77 84 84 84  Wt. (Lbs) 333.12 335 340.08 347 349.12 345.12 345.12  BMI 43.96 44.21 44.88 48.42 47.34 46.8 46.8  Some encounter information is confidential and restricted. Go to Review Flowsheets activity to see all data.   No flowsheet data found.      Morbid obesity Improved. Patient re-educated about  the importance of commitment to a  minimum of 150 minutes of exercise per week.  The importance of healthy food choices with portion control discussed. Encouraged to start a food diary, count calories and to  consider  joining a support group. Sample diet sheets offered. Goals set by the patient for the next several months.   Weight /BMI 09/13/2015 04/04/2015 12/01/2014  WEIGHT 333 lb 1.9 oz 335 lb 340 lb 1.3 oz  HEIGHT     BMI 43.96 kg/m2 44.21 kg/m2 44.88 kg/m2  Some encounter information is confidential and restricted. Go to Review Flowsheets activity to see all data.    Current exercise per week 60 minutes.       Review of Systems     Objective:   Physical Exam        Assessment & Plan:

## 2015-09-13 NOTE — Assessment & Plan Note (Signed)
Patient educated about the importance of limiting  Carbohydrate intake , the need to commit to daily physical activity for a minimum of 30 minutes , and to commit weight loss. The fact that changes in all these areas will reduce or eliminate all together the development of diabetes is stressed.   Diabetic Labs Latest Ref Rng 09/09/2015 03/14/2015 09/03/2014 03/05/2014 10/16/2013  HbA1c <5.7 % 5.5 5.5 - - 5.6  Chol 125 - 200 mg/dL - 308 - 657 -  HDL >=84 mg/dL - 41 - 69(G) -  Calc LDL <130 mg/dL - 83 - 86 -  Triglycerides <150 mg/dL - 93 - 93 -  Creatinine 0.60 - 1.35 mg/dL 2.95 2.84 1.32 4.40 1.02   BP/Weight 09/13/2015 04/04/2015 12/01/2014 09/03/2014 07/20/2014 04/06/2014 03/17/2014  Systolic BP 124 130 148 156 130 120 122  Diastolic BP 84 88 88 77 84 84 84  Wt. (Lbs) 333.12 335 340.08 347 349.12 345.12 345.12  BMI 43.96 44.21 44.88 48.42 47.34 46.8 46.8  Some encounter information is confidential and restricted. Go to Review Flowsheets activity to see all data.   No flowsheet data found.

## 2015-09-13 NOTE — Assessment & Plan Note (Addendum)
Improved. Patient re-educated about  the importance of commitment to a  minimum of 150 minutes of exercise per week.  The importance of healthy food choices with portion control discussed. Encouraged to start a food diary, count calories and to consider  joining a support group. Sample diet sheets offered. Goals set by the patient for the next several months.   Weight /BMI 09/13/2015 04/04/2015 12/01/2014  WEIGHT 333 lb 1.9 oz 335 lb 340 lb 1.3 oz  HEIGHT     BMI 43.96 kg/m2 44.21 kg/m2 44.88 kg/m2  Some encounter information is confidential and restricted. Go to Review Flowsheets activity to see all data.    Current exercise per week 60 minutes.

## 2015-09-13 NOTE — Patient Instructions (Signed)
Annual wellness 8/30 or after  CBC, fasting lipid, cmp, TSH mid August or after  New additional medication for allergies is a nasal spray  Work on Musician

## 2015-09-13 NOTE — Assessment & Plan Note (Signed)
Uncontrolled , add veramyst daily

## 2015-10-13 ENCOUNTER — Other Ambulatory Visit: Payer: Self-pay

## 2015-10-13 MED ORDER — LORATADINE 10 MG PO TABS: 10.0000 mg | ORAL_TABLET | Freq: Every day | ORAL | Status: DC

## 2015-11-15 ENCOUNTER — Other Ambulatory Visit: Payer: Self-pay | Admitting: Family Medicine

## 2015-12-14 ENCOUNTER — Other Ambulatory Visit: Payer: Self-pay | Admitting: Family Medicine

## 2016-01-16 ENCOUNTER — Other Ambulatory Visit: Payer: Self-pay | Admitting: Family Medicine

## 2016-02-16 ENCOUNTER — Other Ambulatory Visit: Payer: Self-pay | Admitting: Family Medicine

## 2016-03-20 ENCOUNTER — Other Ambulatory Visit: Payer: Self-pay | Admitting: Family Medicine

## 2016-03-23 ENCOUNTER — Telehealth: Payer: Self-pay

## 2016-03-23 DIAGNOSIS — I10 Essential (primary) hypertension: Secondary | ICD-10-CM

## 2016-03-23 DIAGNOSIS — E739 Lactose intolerance, unspecified: Secondary | ICD-10-CM

## 2016-03-23 LAB — COMPREHENSIVE METABOLIC PANEL
ALT: 27 U/L (ref 9–46)
AST: 25 U/L (ref 10–40)
Albumin: 4.2 g/dL (ref 3.6–5.1)
Alkaline Phosphatase: 70 U/L (ref 40–115)
BILIRUBIN TOTAL: 0.7 mg/dL (ref 0.2–1.2)
BUN: 18 mg/dL (ref 7–25)
CO2: 24 mmol/L (ref 20–31)
CREATININE: 0.91 mg/dL (ref 0.60–1.35)
Calcium: 9.4 mg/dL (ref 8.6–10.3)
Chloride: 103 mmol/L (ref 98–110)
GLUCOSE: 83 mg/dL (ref 65–99)
Potassium: 4 mmol/L (ref 3.5–5.3)
SODIUM: 141 mmol/L (ref 135–146)
Total Protein: 6.9 g/dL (ref 6.1–8.1)

## 2016-03-23 LAB — CBC
HCT: 45.4 % (ref 38.5–50.0)
Hemoglobin: 15.7 g/dL (ref 13.2–17.1)
MCH: 30.5 pg (ref 27.0–33.0)
MCHC: 34.6 g/dL (ref 32.0–36.0)
MCV: 88.3 fL (ref 80.0–100.0)
MPV: 9 fL (ref 7.5–12.5)
Platelets: 229 10*3/uL (ref 140–400)
RBC: 5.14 MIL/uL (ref 4.20–5.80)
RDW: 13 % (ref 11.0–15.0)
WBC: 7.8 10*3/uL (ref 3.8–10.8)

## 2016-03-23 LAB — LIPID PANEL
Cholesterol: 138 mg/dL (ref 125–200)
HDL: 41 mg/dL (ref >=40)
LDL CALC: 76 mg/dL (ref <130)
TRIGLYCERIDES: 103 mg/dL (ref <150)
Total CHOL/HDL Ratio: 3.4 Ratio (ref <=5.0)
VLDL: 21 mg/dL (ref <30)

## 2016-03-23 LAB — TSH: TSH: 1 mIU/L (ref 0.40–4.50)

## 2016-03-23 NOTE — Telephone Encounter (Signed)
Lab order entered for upcoming appointment.  Labs in system expired.

## 2016-04-04 ENCOUNTER — Ambulatory Visit (INDEPENDENT_AMBULATORY_CARE_PROVIDER_SITE_OTHER): Payer: Medicare Other

## 2016-04-04 VITALS — BP 128/72 | HR 60 | Resp 18 | Ht 73.0 in | Wt 340.0 lb

## 2016-04-04 DIAGNOSIS — Z23 Encounter for immunization: Secondary | ICD-10-CM | POA: Diagnosis not present

## 2016-04-04 DIAGNOSIS — Z0000 Encounter for general adult medical examination without abnormal findings: Secondary | ICD-10-CM

## 2016-04-04 DIAGNOSIS — G4700 Insomnia, unspecified: Secondary | ICD-10-CM

## 2016-04-04 NOTE — Patient Instructions (Addendum)
Thank you for allowing us to share in your healthcare needs.    The purpose of the Annual Wellness Visit is for us to assist in preserving and improving your health.   Dr. Lodema HongSimpson will see you back in 6 months   Labs will be mailed to your home to have done a week before your visit.   You will here back this week in reference to the sleeping medication.

## 2016-04-04 NOTE — Progress Notes (Signed)
Subjective:    Scott NephewJefferey L Mendez is a 35 y.o. male who presents for Medicare Annual/Subsequent preventive examination.   Preventive Screening-Counseling & Management  Tobacco History  Smoking Status  . Never Smoker  Smokeless Tobacco  . Never Used    Current Problems (verified) Patient Active Problem List   Diagnosis Date Noted  . Insomnia 12/01/2014  . Medicare annual wellness visit, subsequent 03/17/2014  . Onychomycosis with ingrown toenail 12/22/2012  . Autism 12/22/2012  . IMPAIRED GLUCOSE TOLERANCE 08/17/2010  . Seasonal allergies 11/18/2009  . Essential hypertension 10/11/2009  . Morbid obesity (HCC) 10/31/2007  . MENTAL RETARDATION 10/31/2007    Medications Prior to Visit Current Outpatient Prescriptions on File Prior to Visit  Medication Sig Dispense Refill  . fluticasone (VERAMYST) 27.5 MCG/SPRAY nasal spray Place 2 sprays into the nose daily. 10 g 4  . hydrochlorothiazide (HYDRODIURIL) 25 MG tablet TAKE 1 TABLET BY MOUTH ONCE A DAY. 30 tablet 3  . loratadine (CLARITIN) 10 MG tablet TAKE ONE TABLET BY MOUTH ONCE DAILY. 30 tablet 3  . montelukast (SINGULAIR) 10 MG tablet TAKE ONE TABLET BY MOUTH AT BEDTIME. 30 tablet 5  . potassium chloride SA (K-DUR,KLOR-CON) 20 MEQ tablet TAKE (1) TABLET BY MOUTH ONCE DAILY. 30 tablet 3   No current facility-administered medications on file prior to visit.     Current Medications (verified) Current Outpatient Prescriptions  Medication Sig Dispense Refill  . fluticasone (VERAMYST) 27.5 MCG/SPRAY nasal spray Place 2 sprays into the nose daily. 10 g 4  . hydrochlorothiazide (HYDRODIURIL) 25 MG tablet TAKE 1 TABLET BY MOUTH ONCE A DAY. 30 tablet 3  . loratadine (CLARITIN) 10 MG tablet TAKE ONE TABLET BY MOUTH ONCE DAILY. 30 tablet 3  . montelukast (SINGULAIR) 10 MG tablet TAKE ONE TABLET BY MOUTH AT BEDTIME. 30 tablet 5  . potassium chloride SA (K-DUR,KLOR-CON) 20 MEQ tablet TAKE (1) TABLET BY MOUTH ONCE DAILY. 30 tablet 3    No current facility-administered medications for this visit.      Allergies (verified) Review of patient's allergies indicates no known allergies.   PAST HISTORY  Family History Family History  Problem Relation Age of Onset  . Hypertension Mother   . Hypertension Father   . Diabetes Father   . Heart disease Maternal Grandfather   . Diabetes Paternal Grandmother   . Seizures Maternal Uncle   . CVA Maternal Grandmother   . Heart disease Paternal Grandfather   . ADD / ADHD Neg Hx   . Alcohol abuse Neg Hx   . Drug abuse Neg Hx   . Anxiety disorder Neg Hx   . Bipolar disorder Neg Hx   . Depression Neg Hx   . Dementia Neg Hx   . OCD Neg Hx   . Paranoid behavior Neg Hx   . Schizophrenia Neg Hx   . Sexual abuse Neg Hx   . Physical abuse Neg Hx     Social History Social History  Substance Use Topics  . Smoking status: Never Smoker  . Smokeless tobacco: Never Used  . Alcohol use No    Are there smokers in your home (other than you)?  No  Risk Factors Current exercise habits: The patient does not participate in regular exercise at present.  Dietary issues discussed: Most meals prepared in home by mother who is primary caregiver;  Discussed portion size   Cardiac risk factors: hypertension, male gender and obesity (BMI >= 30 kg/m2).  Depression Screen (Note: if answer to either of the  following is "Yes", a more complete depression screening is indicated)   Q1: Over the past two weeks, have you felt down, depressed or hopeless? No  Q2: Over the past two weeks, have you felt little interest or pleasure in doing things? No  Have you lost interest or pleasure in daily life? No  Do you often feel hopeless? No  Do you cry easily over simple problems? No  Activities of Daily Living In your present state of health, do you have any difficulty performing the following activities?:  Driving? Yes Managing money?  Yes Feeding yourself? No Getting from bed to chair?  No Climbing a flight of stairs? No Preparing food and eating?: Unable to prepare food on his own but no problems with eating  Bathing or showering? No Getting dressed: No Getting to the toilet? No Using the toilet:No Moving around from place to place: No In the past year have you fallen or had a near fall?:No   Are you sexually active?  No  Do you have more than one partner?  No  Hearing Difficulties: No Do you often ask people to speak up or repeat themselves? No Do you experience ringing or noises in your ears? No Do you have difficulty understanding soft or whispered voices? No   Do you feel that you have a problem with memory? No  Do you often misplace items? No  Do you feel safe at home?  Yes  Cognitive Testing  Alert? Yes  Normal Appearance?Yes  Oriented to person? Yes  Place? Yes   Time? No  Recall of three objects?  No  Can perform simple calculations? No  Displays appropriate judgment?No  Can read the correct time from a watch face?No   Advanced Directives have been discussed with the patient? Yes   List the Names of Other Physician/Practitioners you currently use: 1.    Indicate any recent Medical Services you may have received from other than [facility] providers in the past year (date may be approximate).  Immunization History  Administered Date(s) Administered  . H1N1 06/03/2008  . Influenza Split 04/22/2012  . Influenza Whole 05/07/2007, 05/02/2009, 04/17/2010  . Influenza,inj,Quad PF,36+ Mos 05/18/2013, 07/20/2014, 04/04/2015, 04/04/2016  . Td 11/23/2002  . Tdap 12/19/2011    Screening Tests Health Maintenance  Topic Date Due  . INFLUENZA VACCINE  03/06/2016  . HIV Screening  05/23/2017 (Originally 01/15/1998)  . TETANUS/TDAP  12/18/2021    All answers were reviewed with the patient and necessary referrals were made:  Durwin Nora, LPN   1/61/0960   History reviewed: allergies, current medications, past family history, past medical  history, past social history, past surgical history and problem list  Review of Systems A comprehensive review of systems was negative.    Objective:     Vision by Snellen chart: right AVW:UJWJXB to measure, left JYN:WGNFAO to measure Blood pressure 128/72, pulse 60, resp. rate 18, height 6\' 1"  (1.854 m), weight (!) 340 lb (154.2 kg), SpO2 96 %. Body mass index is 44.86 kg/m.  No exam performed today, annual wellness visit without physical exam.      Assessment:     Plan:     During the course of the visit the patient was educated and counseled about appropriate screening and preventive services including:    continue with participation at [facility]  Diet review for nutrition referral? Yes ____  Not Indicated __x__   Patient Instructions (the written plan) was given to the patient.  Medicare Attestation I  have personally reviewed: The patient's medical and social history Their use of alcohol, tobacco or illicit drugs Their current medications and supplements The patient's functional ability including ADLs,fall risks, home safety risks, cognitive, and hearing and visual impairment Diet and physical activities Evidence for depression or mood disorders  The patient's weight, height, BMI, and visual acuity have been recorded in the chart.  I have made referrals, counseling, and provided education to the patient based on review of the above and I have provided the patient with a written personalized care plan for preventive services.     Kandis Fantasia [place], [place]   1/61/0960    Medicare annual wellness visit, subsequent Annual exam as documented. Counseling done  re healthy lifestyle involving commitment to 150 minutes exercise per week, heart healthy diet, and attaining healthy weight.The importance of adequate sleep also discussed. Regular seat belt use and home safety, is also discussed. Changes in health habits are decided on by the patient with goals and time  frames  set for achieving them. Immunization and cancer screening needs are specifically addressed at this visit.   Insomnia No response to restoril so Mother discontinued the medication Trial of trazodone  Need for prophylactic vaccination and inoculation against influenza After obtaining informed consent, the vaccine is  administered by LPN.

## 2016-04-05 NOTE — Assessment & Plan Note (Signed)

## 2016-04-06 ENCOUNTER — Other Ambulatory Visit: Payer: Self-pay | Admitting: Family Medicine

## 2016-04-06 ENCOUNTER — Telehealth: Payer: Self-pay

## 2016-04-06 MED ORDER — TRAZODONE HCL 50 MG PO TABS: 25.0000 mg | ORAL_TABLET | Freq: Every evening | ORAL | 3 refills | Status: DC | PRN

## 2016-04-06 NOTE — Telephone Encounter (Signed)
Trazodone is sent in, pls let  her know

## 2016-04-08 NOTE — Assessment & Plan Note (Signed)
After obtaining informed consent, the vaccine is  administered by LPN.  

## 2016-04-08 NOTE — Assessment & Plan Note (Signed)
No response to restoril so Mother discontinued the medication Trial of trazodone

## 2016-04-10 NOTE — Telephone Encounter (Signed)
Mother aware and will collect medication

## 2016-06-25 ENCOUNTER — Other Ambulatory Visit: Payer: Self-pay | Admitting: Family Medicine

## 2016-09-01 ENCOUNTER — Other Ambulatory Visit: Payer: Self-pay | Admitting: Family Medicine

## 2016-09-17 ENCOUNTER — Telehealth: Payer: Self-pay

## 2016-09-17 DIAGNOSIS — I10 Essential (primary) hypertension: Secondary | ICD-10-CM

## 2016-09-17 LAB — LIPID PANEL
CHOLESTEROL: 145 mg/dL (ref <200)
HDL: 37 mg/dL — ABNORMAL LOW (ref >40)
LDL Cholesterol: 89 mg/dL (ref <100)
TRIGLYCERIDES: 96 mg/dL (ref <150)
Total CHOL/HDL Ratio: 3.9 Ratio (ref <5.0)
VLDL: 19 mg/dL (ref <30)

## 2016-09-17 LAB — CBC
HCT: 44.4 % (ref 38.5–50.0)
HEMOGLOBIN: 14.7 g/dL (ref 13.2–17.1)
MCH: 29.9 pg (ref 27.0–33.0)
MCHC: 33.1 g/dL (ref 32.0–36.0)
MCV: 90.2 fL (ref 80.0–100.0)
MPV: 8.8 fL (ref 7.5–12.5)
Platelets: 288 10*3/uL (ref 140–400)
RBC: 4.92 MIL/uL (ref 4.20–5.80)
RDW: 13.1 % (ref 11.0–15.0)
WBC: 8.4 10*3/uL (ref 3.8–10.8)

## 2016-09-17 LAB — COMPLETE METABOLIC PANEL WITH GFR
ALT: 48 U/L — AB (ref 9–46)
AST: 27 U/L (ref 10–40)
Albumin: 4.3 g/dL (ref 3.6–5.1)
Alkaline Phosphatase: 76 U/L (ref 40–115)
BILIRUBIN TOTAL: 0.7 mg/dL (ref 0.2–1.2)
BUN: 22 mg/dL (ref 7–25)
CALCIUM: 9.5 mg/dL (ref 8.6–10.3)
CHLORIDE: 106 mmol/L (ref 98–110)
CO2: 31 mmol/L (ref 20–31)
CREATININE: 0.95 mg/dL (ref 0.60–1.35)
GFR, Est Non African American: >89 mL/min (ref >=60)
Glucose, Bld: 91 mg/dL (ref 65–99)
Potassium: 3.9 mmol/L (ref 3.5–5.3)
Sodium: 144 mmol/L (ref 135–146)
Total Protein: 6.8 g/dL (ref 6.1–8.1)

## 2016-09-17 LAB — TSH: TSH: 2.43 m[IU]/L (ref 0.40–4.50)

## 2016-09-17 NOTE — Telephone Encounter (Signed)
Labs ordered.

## 2016-10-03 ENCOUNTER — Encounter: Payer: Self-pay | Admitting: Family Medicine

## 2016-10-03 ENCOUNTER — Ambulatory Visit (INDEPENDENT_AMBULATORY_CARE_PROVIDER_SITE_OTHER): Payer: Medicare Other | Admitting: Family Medicine

## 2016-10-03 VITALS — BP 116/80 | HR 100 | Resp 18 | Ht 73.0 in | Wt 351.0 lb

## 2016-10-03 DIAGNOSIS — B350 Tinea barbae and tinea capitis: Secondary | ICD-10-CM | POA: Diagnosis not present

## 2016-10-03 DIAGNOSIS — I10 Essential (primary) hypertension: Secondary | ICD-10-CM

## 2016-10-03 DIAGNOSIS — J302 Other seasonal allergic rhinitis: Secondary | ICD-10-CM

## 2016-10-03 MED ORDER — TERBINAFINE HCL 250 MG PO TABS: 250.0000 mg | ORAL_TABLET | Freq: Every day | ORAL | 0 refills | Status: DC

## 2016-10-03 NOTE — Assessment & Plan Note (Signed)
Treat for 6 weeks

## 2016-10-03 NOTE — Assessment & Plan Note (Signed)
Controlled, no change in medication  

## 2016-10-03 NOTE — Progress Notes (Signed)
   Scott Mendez     MRN: [id number]      DOB: 1981-06-22   HPI Scott Mendez is here for follow up and re-evaluation of chronic medical conditions, medication management and review of any available recent lab and radiology data.  The PT denies any adverse reactions to current medications since the last visit.  Rash on scalp and weight gain are concerns  ROS History from Mom Denies recent fever or chills. Denies sinus pressure, nasal congestion, ear pain or sore throat. Denies chest congestion, productive cough or wheezing. Denies chest pains, palpitations and leg swelling Denies abdominal pain, nausea, vomiting,diarrhea or constipation.   Denies dysuria, frequency, hesitancy or incontinence. Denies joint pain, swelling and limitation in mobility. Denies headaches, seizures, numbness, or tingling. Denies depression, anxiety or insomnia.  PE  BP 116/80   Pulse 100   Resp 18   Ht 6\' 1"  (1.854 m)   Wt (!) 351 lb (159.2 kg)   SpO2 92%   BMI 46.31 kg/m   Patient alert and oriented and in no cardiopulmonary distress.  HEENT: No facial asymmetry, EOMI,   oropharynx pink and moist.  Neck supple no JVD, no mass.  Chest: Clear to auscultation bilaterally.  CVS: S1, S2 no murmurs, no S3.Regular rate.  ABD: Soft non tender.   Ext: No edema  MS: Adequate ROM spine, shoulders, hips and knees.  Skin: Intact, no ulcerations or rash noted.  Psych: Good eye contact, normal affect. Memory intact not anxious or depressed appearing.  CNS: CN 2-12 intact, power,  normal throughout.no focal deficits noted.   Assessment & Plan  Essential hypertension Controlled, no change in medication DASH diet and commitment to daily physical activity for a minimum of 30 minutes discussed and encouraged, as a part of hypertension management. The importance of attaining a healthy weight is also discussed.  BP/Weight 10/03/2016 04/04/2016 09/13/2015 04/04/2015 12/01/2014 09/03/2014 07/20/2014    Systolic BP 116 128 124 130 148 156 130  Diastolic BP 80 72 84 88 88 77 84  Wt. (Lbs) 351 340 333.12 335 340.08 347 349.12  BMI 46.31 44.86 43.96 44.21 44.88 48.42 47.34  Some encounter information is confidential and restricted. Go to Review Flowsheets activity to see all data.       Morbid obesity [name]. Patient re-educated about  the importance of commitment to a  minimum of 150 minutes of exercise per week.  The importance of healthy food choices with portion control discussed. Encouraged to start a food diary, count calories and to consider  joining a support group. Sample diet sheets offered. Goals set by the patient for the next several months.   Weight /BMI 10/03/2016 04/04/2016 09/13/2015  WEIGHT 351 lb 340 lb 333 lb 1.9 oz  HEIGHT 6\' 1"  6\' 1"  6\' 1"   BMI 46.31 kg/m2 44.86 kg/m2 43.96 kg/m2  Some encounter information is confidential and restricted. Go to Review Flowsheets activity to see all data.      Seasonal allergies Controlled, no change in medication   Tinea capitis Treat for 6 weeks

## 2016-10-03 NOTE — Patient Instructions (Signed)
Annual wellness August 31 or shortly after , call I if you need me before  MD visit sma e day as wellness  New medication for infected scalp  It is important that you exercise regularly at least 30 minutes 5 times a week. If you develop chest pain, have severe difficulty breathing, or feel very tired, stop exercising immediately and seek medical attention    Please work on good  health habits so that your health will improve. 1. Commitment to daily physical activity for 30 to 60  minutes, if you are able to do this.  2. Commitment to wise food choices. Aim for half of your  food intake to be vegetable and fruit, one quarter starchy foods, and one quarter protein. Try to eat on a regular schedule  3 meals per day, snacking between meals should be limited to vegetables or fruits or small portions of nuts. 64 ounces of water per day is generally recommended, unless you have specific health conditions, like heart failure or kidney failure where you will need to limit fluid intake.  3. Commitment to sufficient and a  good quality of physical and mental rest daily, generally between 6 to 8 hours per day.  WITH PERSISTANCE AND PERSEVERANCE, THE IMPOSSIBLE , BECOMES THE NORM! NEED to lOSE 10 pounds!

## 2016-10-03 NOTE — Assessment & Plan Note (Signed)
[name]. Patient re-educated about  the importance of commitment to a  minimum of 150 minutes of exercise per week.  The importance of healthy food choices with portion control discussed. Encouraged to start a food diary, count calories and to consider  joining a support group. Sample diet sheets offered. Goals set by the patient for the next several months.   Weight /BMI 10/03/2016 04/04/2016 09/13/2015  WEIGHT 351 lb 340 lb 333 lb 1.9 oz  HEIGHT 6\' 1"  6\' 1"  6\' 1"   BMI 46.31 kg/m2 44.86 kg/m2 43.96 kg/m2  Some encounter information is confidential and restricted. Go to Review Flowsheets activity to see all data.

## 2016-10-03 NOTE — Assessment & Plan Note (Signed)
Controlled, no change in medication DASH diet and commitment to daily physical activity for a minimum of 30 minutes discussed and encouraged, as a part of hypertension management. The importance of attaining a healthy weight is also discussed.  BP/Weight 10/03/2016 04/04/2016 09/13/2015 04/04/2015 12/01/2014 09/03/2014 07/20/2014  Systolic BP 116 128 124 130 148 156 130  Diastolic BP 80 72 84 88 88 77 84  Wt. (Lbs) 351 340 333.12 335 340.08 347 349.12  BMI 46.31 44.86 43.96 44.21 44.88 48.42 47.34  Some encounter information is confidential and restricted. Go to Review Flowsheets activity to see all data.

## 2016-10-30 DIAGNOSIS — F840 Autistic disorder: Secondary | ICD-10-CM | POA: Diagnosis not present

## 2016-10-30 DIAGNOSIS — F72 Severe intellectual disabilities: Secondary | ICD-10-CM | POA: Diagnosis not present

## 2016-11-01 DIAGNOSIS — F72 Severe intellectual disabilities: Secondary | ICD-10-CM | POA: Diagnosis not present

## 2016-11-01 DIAGNOSIS — F840 Autistic disorder: Secondary | ICD-10-CM | POA: Diagnosis not present

## 2016-11-02 DIAGNOSIS — F72 Severe intellectual disabilities: Secondary | ICD-10-CM | POA: Diagnosis not present

## 2016-11-02 DIAGNOSIS — F840 Autistic disorder: Secondary | ICD-10-CM | POA: Diagnosis not present

## 2016-11-10 ENCOUNTER — Other Ambulatory Visit: Payer: Self-pay | Admitting: Family Medicine

## 2016-11-13 ENCOUNTER — Other Ambulatory Visit: Payer: Self-pay | Admitting: Family Medicine

## 2016-12-10 ENCOUNTER — Other Ambulatory Visit: Payer: Self-pay | Admitting: Family Medicine

## 2017-02-14 ENCOUNTER — Other Ambulatory Visit: Payer: Self-pay | Admitting: Family Medicine

## 2017-02-25 ENCOUNTER — Other Ambulatory Visit: Payer: Self-pay | Admitting: Family Medicine

## 2017-03-13 ENCOUNTER — Ambulatory Visit (INDEPENDENT_AMBULATORY_CARE_PROVIDER_SITE_OTHER): Payer: Medicare Other

## 2017-03-13 VITALS — BP 112/74 | HR 84 | Temp 98.7°F | Ht 73.0 in | Wt 355.0 lb

## 2017-03-13 DIAGNOSIS — Z0000 Encounter for general adult medical examination without abnormal findings: Secondary | ICD-10-CM | POA: Diagnosis not present

## 2017-03-13 NOTE — Progress Notes (Signed)
Subjective:   Scott Mendez is a 36 y.o. male who presents for Medicare Annual/Subsequent preventive examination.  Review of Systems:  Cardiac Risk Factors include: hypertension;male gender;obesity (BMI >30kg/m2);sedentary lifestyle     Objective:    Vitals: BP 112/74   Pulse 84   Temp 98.7 F (37.1 C) (Temporal)   Ht 6\' 1"  (1.854 m)   Wt (!) 355 lb 0.6 oz (161 kg)   BMI 46.84 kg/m   Body mass index is 46.84 kg/m.  Tobacco History  Smoking Status  . Never Smoker  Smokeless Tobacco  . Never Used     Counseling given: Not Answered   Past Medical History:  Diagnosis Date  . Allergy   . Autism   . Autism 1986  . Hypertension   . Mental retardation   . Morbid obesity (HCC) 2000   Past Surgical History:  Procedure Laterality Date  . toenail removal    . TOOTH EXTRACTION Right 09/03/2014   Procedure: EXTRACTION MOLAR LOWER RIGHT;  Surgeon: Georgia Lopes, DDS;  Location: [facility] OR;  Service: Oral Surgery;  Laterality: Right;   Family History  Problem Relation Age of Onset  . Hypertension Mother   . Hypertension Father   . Diabetes Father   . Heart disease Maternal Grandfather   . Diabetes Paternal Grandmother   . Seizures Maternal Uncle   . CVA Maternal Grandmother   . Heart disease Paternal Grandfather   . ADD / ADHD Neg Hx   . Alcohol abuse Neg Hx   . Drug abuse Neg Hx   . Anxiety disorder Neg Hx   . Bipolar disorder Neg Hx   . Depression Neg Hx   . Dementia Neg Hx   . OCD Neg Hx   . Paranoid behavior Neg Hx   . Schizophrenia Neg Hx   . Sexual abuse Neg Hx   . Physical abuse Neg Hx    History  Sexual Activity  . Sexual activity: No    Outpatient Encounter Prescriptions as of 03/13/2017  Medication Sig  . fluticasone (FLONASE) 50 MCG/ACT nasal spray INSTILL 2 SPRAYS INTO EACH NOSTRIL DAILY.  . hydrochlorothiazide (HYDRODIURIL) 25 MG tablet TAKE 1 TABLET BY MOUTH ONCE A DAY.  Marland Kitchen loratadine (CLARITIN) 10 MG tablet TAKE ONE TABLET BY MOUTH ONCE  DAILY.  . montelukast (SINGULAIR) 10 MG tablet TAKE ONE TABLET BY MOUTH AT BEDTIME.  Marland Kitchen potassium chloride SA (K-DUR,KLOR-CON) 20 MEQ tablet TAKE (1) TABLET BY MOUTH ONCE DAILY.  Marland Kitchen terbinafine (LAMISIL) 250 MG tablet TAKE 1 TABLET BY MOUTH ONCE A DAY.  . traZODone (DESYREL) 50 MG tablet TAKE 1/2 TO 1 TABLET BY MOUTH AT BEDTIME AS NEEDED FOR SLEEP.  . [DISCONTINUED] fluticasone (VERAMYST) 27.5 MCG/SPRAY nasal spray Place 2 sprays into the nose daily.   No facility-administered encounter medications on file as of 03/13/2017.     Activities of Daily Living In your present state of health, do you have any difficulty performing the following activities: 03/13/2017 04/04/2016  Hearing? N N  Vision? N N  Difficulty concentrating or making decisions? Malvin Johns  Walking or climbing stairs? N N  Dressing or bathing? Y N  Doing errands, shopping? Malvin Johns  Preparing Food and eating ? Y Y  Using the Toilet? Y N  In the past six months, have you accidently leaked urine? N N  Do you have problems with loss of bowel control? N N  Managing your Medications? Y Y  Managing your Finances? Malvin Johns  Housekeeping or managing your Housekeeping? Malvin JohnsY Y  Some recent data might be hidden    Patient Care Team: Kerri PerchesSimpson, Margaret E, MD as PCP - General Mike CrazeWalker, Edwin O, MD (Inactive) as Consulting Physician (Psychiatry)   Assessment:     Exercise Activities and Dietary recommendations Current Exercise Habits: The patient does not participate in regular exercise at present, Exercise limited by: None identified  Goals    . Exercise 3x per week (30 min per time)          Recommend starting a routine exercise program at least 3 days a week for 30-45 minutes at a time as tolerated.        Fall Risk Fall Risk  03/13/2017 04/04/2016 04/04/2015 03/17/2014 05/18/2013  Falls in the past year? No No No No No   Depression Screen PHQ 2/9 Scores 03/13/2017 04/04/2016 05/18/2013  PHQ - 2 Score - - 0  Exception Documentation Medical reason  Medical reason -    Cognitive Function MMSE - Mini Mental State Exam 03/13/2017  Not completed: Unable to complete        Immunization History  Administered Date(s) Administered  . H1N1 06/03/2008  . Influenza Split 04/22/2012  . Influenza Whole 05/07/2007, 05/02/2009, 04/17/2010  . Influenza,inj,Quad PF,36+ Mos 05/18/2013, 07/20/2014, 04/04/2015, 04/04/2016  . Td 11/23/2002  . Tdap 12/19/2011   Screening Tests Health Maintenance  Topic Date Due  . INFLUENZA VACCINE  03/06/2017  . HIV Screening  05/23/2017 (Originally 01/15/1998)  . TETANUS/TDAP  12/18/2021      Plan:   I have personally reviewed and noted the following in the patient's chart:   . Medical and social history . Use of alcohol, tobacco or illicit drugs  . Current medications and supplements . Functional ability and status . Nutritional status . Physical activity . Advanced directives . List of other physicians . Hospitalizations, surgeries, and ER visits in previous 12 months . Vitals . Screenings to include cognitive, depression, and falls . Referrals and appointments  In addition, I have reviewed and discussed with patient certain preventive protocols, quality metrics, and best practice recommendations. A written personalized care plan for preventive services as well as general preventive health recommendations were provided to patient.     Candis ShineKrystal Harrison, LPN  1/6/10968/03/2017

## 2017-03-13 NOTE — Patient Instructions (Addendum)
Scott Mendez , Thank you for taking time to come for your Medicare Wellness Visit. I appreciate your ongoing commitment to your health goals. Please review the following plan we discussed and let me know if I can assist you in the future.   Screening recommendations/referrals: Colonoscopy: Due at age 36 Recommended yearly ophthalmology/optometry visit for glaucoma screening and checkup Recommended yearly dental visit for hygiene and checkup  Vaccinations: Influenza vaccine: Due next month Pneumococcal vaccine: Due at age 27 Tdap vaccine: Up to date, next due 12/2021 Shingles vaccine: Due at age 32    Advanced directives: Please bring a copy of your POA (Power of [place]) to your next appointment.   Conditions/risks identified: Obese, recommend starting a routine exercise program at least 3 days a week for 30-45 minutes at a time as tolerated.  Next appointment: Follow up with Dr. Moshe Cipro on 04/10/2017 at 8:20 am. Follow up in 1 year for your annual wellness visit.   Preventive Care 18-39 Years, Male Preventive care refers to lifestyle choices and visits with your health care provider that can promote health and wellness. What does preventive care include?  A yearly physical exam. This is also called an annual well check.  Dental exams once or twice a year.  Routine eye exams. Ask your health care provider how often you should have your eyes checked.  Personal lifestyle choices, including: ? Daily care of your teeth and gums. ? Regular physical activity. ? Eating a healthy diet. ? Avoiding tobacco and drug use. ? Limiting alcohol use. ? Practicing safe sex. What happens during an annual well check? The services and screenings done by your health care provider during your annual well check will depend on your age, overall health, lifestyle risk factors, and family history of disease. Counseling Your health care provider may ask you questions about your:  Alcohol  use.  Tobacco use.  Drug use.  Emotional well-being.  Home and relationship well-being.  Sexual activity.  Eating habits.  Work and work Statistician.  Screening You may have the following tests or measurements:  Height, weight, and BMI.  Blood pressure.  Lipid and cholesterol levels. These may be checked every 5 years starting at age 75.  Diabetes screening. This is done by checking your blood sugar (glucose) after you have not eaten for a while (fasting).  Skin check.  Hepatitis C blood test.  Hepatitis B blood test.  Sexually transmitted disease (STD) testing.  Discuss your test results, treatment options, and if necessary, the need for more tests with your health care provider. Vaccines Your health care provider may recommend certain vaccines, such as:  Influenza vaccine. This is recommended every year.  Tetanus, diphtheria, and acellular pertussis (Tdap, Td) vaccine. You may need a Td booster every 10 years.  Varicella vaccine. You may need this if you have not been vaccinated.  HPV vaccine. If you are 74 or younger, you may need three doses over 6 months.  Measles, mumps, and rubella (MMR) vaccine. You may need at least one dose of MMR.You may also need a second dose.  Pneumococcal 13-valent conjugate (PCV13) vaccine. You may need this if you have certain conditions and have not been vaccinated.  Pneumococcal polysaccharide (PPSV23) vaccine. You may need one or two doses if you smoke cigarettes or if you have certain conditions.  Meningococcal vaccine. One dose is recommended if you are age 47-21 years and a first-year college student living in a residence hall, or if you have one of several  medical conditions. You may also need additional booster doses.  Hepatitis A vaccine. You may need this if you have certain conditions or if you travel or work in places where you may be exposed to hepatitis A.  Hepatitis B vaccine. You may need this if you have  certain conditions or if you travel or work in places where you may be exposed to hepatitis B.  Haemophilus influenzae type b (Hib) vaccine. You may need this if you have certain risk factors.  Talk to your health care provider about which screenings and vaccines you need and how often you need them. This information is not intended to replace advice given to you by your health care provider. Make sure you discuss any questions you have with your health care provider. Document Released: 09/18/2001 Document Revised: 04/11/2016 Document Reviewed: 05/24/2015 Elsevier Interactive Patient Education  2017 [employer] Prevention in the Home Falls can cause injuries. They can happen to people of all ages. There are many things you can do to make your home safe and to help prevent falls. What can I do on the outside of my home?  Regularly fix the edges of walkways and driveways and fix any cracks.  Remove anything that might make you trip as you walk through a door, such as a raised step or threshold.  Trim any bushes or trees on the path to your home.  Use bright outdoor lighting.  Clear any walking paths of anything that might make someone trip, such as rocks or tools.  Regularly check to see if handrails are loose or broken. Make sure that both sides of any steps have handrails.  Any raised decks and porches should have guardrails on the edges.  Have any leaves, snow, or ice cleared regularly.  Use sand or salt on walking paths during winter.  Clean up any spills in your garage right away. This includes oil or grease spills. What can I do in the bathroom?  Use night lights.  Install grab bars by the toilet and in the tub and shower. Do not use towel bars as grab bars.  Use non-skid mats or decals in the tub or shower.  If you need to sit down in the shower, use a plastic, non-slip stool.  Keep the floor dry. Clean up any water that spills on the floor as soon as it  happens.  Remove soap buildup in the tub or shower regularly.  Attach bath mats securely with double-sided non-slip rug tape.  Do not have throw rugs and other things on the floor that can make you trip. What can I do in the bedroom?  Use night lights.  Make sure that you have a light by your bed that is easy to reach.  Do not use any sheets or blankets that are too big for your bed. They should not hang down onto the floor.  Have a firm chair that has side arms. You can use this for support while you get dressed.  Do not have throw rugs and other things on the floor that can make you trip. What can I do in the kitchen?  Clean up any spills right away.  Avoid walking on wet floors.  Keep items that you use a lot in easy-to-reach places.  If you need to reach something above you, use a strong step stool that has a grab bar.  Keep electrical cords out of the way.  Do not use floor polish or wax that makes  floors slippery. If you must use wax, use non-skid floor wax.  Do not have throw rugs and other things on the floor that can make you trip. What can I do with my stairs?  Do not leave any items on the stairs.  Make sure that there are handrails on both sides of the stairs and use them. Fix handrails that are broken or loose. Make sure that handrails are as long as the stairways.  Check any carpeting to make sure that it is firmly attached to the stairs. Fix any carpet that is loose or worn.  Avoid having throw rugs at the top or bottom of the stairs. If you do have throw rugs, attach them to the floor with carpet tape.  Make sure that you have a light switch at the top of the stairs and the bottom of the stairs. If you do not have them, ask someone to add them for you. What else can I do to help prevent falls?  Wear shoes that:  Do not have high heels.  Have rubber bottoms.  Are comfortable and fit you well.  Are closed at the toe. Do not wear sandals.  If you  use a stepladder:  Make sure that it is fully opened. Do not climb a closed stepladder.  Make sure that both sides of the stepladder are locked into place.  Ask someone to hold it for you, if possible.  Clearly mark and make sure that you can see:  Any grab bars or handrails.  First and last steps.  Where the edge of each step is.  Use tools that help you move around (mobility aids) if they are needed. These include:  Canes.  Walkers.  Scooters.  Crutches.  Turn on the lights when you go into a dark area. Replace any light bulbs as soon as they burn out.  Set up your furniture so you have a clear path. Avoid moving your furniture around.  If any of your floors are uneven, fix them.  If there are any pets around you, be aware of where they are.  Review your medicines with your doctor. Some medicines can make you feel dizzy. This can increase your chance of falling. Ask your doctor what other things that you can do to help prevent falls. This information is not intended to replace advice given to you by your health care provider. Make sure you discuss any questions you have with your health care provider. Document Released: 05/19/2009 Document Revised: 12/29/2015 Document Reviewed: 08/27/2014 Elsevier Interactive Patient Education  2017 [employer].

## 2017-03-21 ENCOUNTER — Other Ambulatory Visit: Payer: Self-pay | Admitting: Family Medicine

## 2017-03-29 ENCOUNTER — Other Ambulatory Visit: Payer: Self-pay | Admitting: Family Medicine

## 2017-04-10 ENCOUNTER — Ambulatory Visit (INDEPENDENT_AMBULATORY_CARE_PROVIDER_SITE_OTHER): Payer: Medicare Other | Admitting: Family Medicine

## 2017-04-10 ENCOUNTER — Ambulatory Visit: Payer: Self-pay

## 2017-04-10 ENCOUNTER — Encounter: Payer: Self-pay | Admitting: Family Medicine

## 2017-04-10 VITALS — BP 110/80 | HR 84 | Temp 98.4°F | Resp 16 | Ht 73.0 in | Wt 354.8 lb

## 2017-04-10 DIAGNOSIS — J3089 Other allergic rhinitis: Secondary | ICD-10-CM | POA: Diagnosis not present

## 2017-04-10 DIAGNOSIS — F5101 Primary insomnia: Secondary | ICD-10-CM | POA: Diagnosis not present

## 2017-04-10 DIAGNOSIS — I10 Essential (primary) hypertension: Secondary | ICD-10-CM | POA: Diagnosis not present

## 2017-04-10 DIAGNOSIS — Z114 Encounter for screening for human immunodeficiency virus [HIV]: Secondary | ICD-10-CM | POA: Diagnosis not present

## 2017-04-10 DIAGNOSIS — Z23 Encounter for immunization: Secondary | ICD-10-CM

## 2017-04-10 NOTE — Patient Instructions (Signed)
[place] physical exam end February, call if you need me sooner  Flu vaccine today  CBC, fasting lipid , cmp and EGFr, TSH and HIV Feb 12 or after  Please increase fruit and vegetable reduce sweets, sodas, cakes , ice cream, NEED TO LOSE WEIGHT  Keep happy!! Thank you  for choosing  [facility]. We consider it a privelige to serve you.  Delivering excellent health care in a caring and  compassionate way is our goal.  Partnering with you,  so that together we can achieve this goal is our strategy.

## 2017-04-10 NOTE — Progress Notes (Signed)
   Scott NephewJefferey L Mendez     MRN: [id number]      DOB: 07/19/1981   HPI History from Mother, patient is incapable Mr. Tkach is here for follow up and re-evaluation of chronic medical conditions, medication management and review of any available recent lab and radiology data.  Preventive health is updated, specifically  Cancer screening and Immunization.   Mom denies any adverse reactions to current medications since the last visit.  There are no new concerns.  There are no specific complaints   ROS Denies recent fever or chills. Denies sinus pressure, nasal congestion, ear pain or sore throat. Denies chest congestion, productive cough or wheezing. Denies ND , orthopnea  and leg swelling Denies abdominal pain, nausea, vomiting,diarrhea or constipation.   Denies dysuria, frequency, hesitancy or incontinence. Denies joint pain, swelling and limitation in mobility. Denies headaches, seizures, numbness, or tingling. Denies uncontrolled  depression, anxiety or insomnia. Denies skin break down or rash.   PE  BP 140/82 (BP Location: Left Arm, Patient Position: Sitting, Cuff Size: Normal)   Pulse 84   Temp 98.4 F (36.9 C) (Other (Comment))   Resp 16   Ht 6\' 1"  (1.854 m)   Wt (!) 354 lb 12 oz (160.9 kg)   SpO2 96%   BMI 46.80 kg/m   Patient alert and in no cardiopulmonary distress.  HEENT: No facial asymmetry, EOMI,   oropharynx pink and moist.  Neck supple no JVD, no mass.  Chest: Clear to auscultation bilaterally.  CVS: S1, S2 no murmurs, no S3.Regular rate.  ABD: Soft non tender.   Ext: No edema  MS: Adequate ROM spine, shoulders, hips and knees.  Skin: Intact, no ulcerations or rash noted.  Psych: Good eye contact,  not anxious or depressed appearing.  CNS: CN 2-12 intact, power,  normal throughout.no focal deficits noted.   Assessment & Plan  Essential hypertension Adequate though suboptimal control, needs to work on weight loss, no med change DASH diet  and commitment to daily physical activity for a minimum of 30 minutes discussed and encouraged, as a part of hypertension management. The importance of attaining a healthy weight is also discussed.  BP/Weight 04/10/2017 03/13/2017 10/03/2016 04/04/2016 09/13/2015 04/04/2015 12/01/2014  Systolic BP 110 112 116 128 124 130 148  Diastolic BP 80 74 80 72 84 88 88  Wt. (Lbs) 354.75 355.04 351 340 333.12 335 340.08  BMI 46.8 46.84 46.31 44.86 43.96 44.21 44.88  Some encounter information is confidential and restricted. Go to Review Flowsheets activity to see all data.       Morbid obesity Unchnaged Patient and Mother re-educated about  the importance of commitment to a  minimum of 150 minutes of exercise per week.  The importance of healthy food choices with portion control discussed. Goals set  for the next several months.   Weight /BMI 04/10/2017 03/13/2017 10/03/2016  WEIGHT 354 lb 12 oz 355 lb 0.6 oz 351 lb  HEIGHT 6\' 1"  6\' 1"  6\' 1"   BMI 46.8 kg/m2 46.84 kg/m2 46.31 kg/m2  Some encounter information is confidential and restricted. Go to Review Flowsheets activity to see all data.      Seasonal allergies Controlled, no change in medication   Insomnia Controlled, no change in medication Sleep hygiene reviewed and written information offered also. Prescription sent for  medication needed.

## 2017-04-14 NOTE — Assessment & Plan Note (Signed)
Adequate though suboptimal control, needs to work on weight loss, no med change DASH diet and commitment to daily physical activity for a minimum of 30 minutes discussed and encouraged, as a part of hypertension management. The importance of attaining a healthy weight is also discussed.  BP/Weight 04/10/2017 03/13/2017 10/03/2016 04/04/2016 09/13/2015 04/04/2015 12/01/2014  Systolic BP 110 112 116 128 124 130 148  Diastolic BP 80 74 80 72 84 88 88  Wt. (Lbs) 354.75 355.04 351 340 333.12 335 340.08  BMI 46.8 46.84 46.31 44.86 43.96 44.21 44.88  Some encounter information is confidential and restricted. Go to Review Flowsheets activity to see all data.

## 2017-04-14 NOTE — Assessment & Plan Note (Addendum)
Unchnaged Patient and Mother re-educated about  the importance of commitment to a  minimum of 150 minutes of exercise per week.  The importance of healthy food choices with portion control discussed. Goals set  for the next several months.   Weight /BMI 04/10/2017 03/13/2017 10/03/2016  WEIGHT 354 lb 12 oz 355 lb 0.6 oz 351 lb  HEIGHT 6\' 1"  6\' 1"  6\' 1"   BMI 46.8 kg/m2 46.84 kg/m2 46.31 kg/m2  Some encounter information is confidential and restricted. Go to Review Flowsheets activity to see all data.

## 2017-04-14 NOTE — Assessment & Plan Note (Signed)
Controlled, no change in medication  

## 2017-04-14 NOTE — Assessment & Plan Note (Signed)
Controlled, no change in medication Sleep hygiene reviewed and written information offered also. Prescription sent for  medication needed.  

## 2017-04-24 ENCOUNTER — Other Ambulatory Visit: Payer: Self-pay | Admitting: Family Medicine

## 2017-04-24 NOTE — Telephone Encounter (Signed)
Seen [phone] 

## 2017-06-28 ENCOUNTER — Other Ambulatory Visit: Payer: Self-pay | Admitting: Family Medicine

## 2017-09-09 ENCOUNTER — Encounter: Payer: Self-pay | Admitting: Family Medicine

## 2017-09-09 ENCOUNTER — Ambulatory Visit (INDEPENDENT_AMBULATORY_CARE_PROVIDER_SITE_OTHER): Payer: Medicare Other | Admitting: Family Medicine

## 2017-09-09 VITALS — BP 128/80 | HR 101 | Resp 16 | Ht 73.0 in | Wt 363.0 lb

## 2017-09-09 DIAGNOSIS — F5101 Primary insomnia: Secondary | ICD-10-CM

## 2017-09-09 DIAGNOSIS — I10 Essential (primary) hypertension: Secondary | ICD-10-CM

## 2017-09-09 DIAGNOSIS — L089 Local infection of the skin and subcutaneous tissue, unspecified: Secondary | ICD-10-CM | POA: Diagnosis not present

## 2017-09-09 DIAGNOSIS — L729 Follicular cyst of the skin and subcutaneous tissue, unspecified: Secondary | ICD-10-CM

## 2017-09-09 DIAGNOSIS — G4733 Obstructive sleep apnea (adult) (pediatric): Secondary | ICD-10-CM

## 2017-09-09 DIAGNOSIS — J302 Other seasonal allergic rhinitis: Secondary | ICD-10-CM | POA: Diagnosis not present

## 2017-09-09 DIAGNOSIS — Z114 Encounter for screening for human immunodeficiency virus [HIV]: Secondary | ICD-10-CM | POA: Diagnosis not present

## 2017-09-09 DIAGNOSIS — R0683 Snoring: Secondary | ICD-10-CM | POA: Diagnosis not present

## 2017-09-09 LAB — COMPLETE METABOLIC PANEL WITH GFR
AG Ratio: 1.7 (calc) (ref 1.0–2.5)
ALT: 30 U/L (ref 9–46)
AST: 25 U/L (ref 10–40)
Albumin: 4.2 g/dL (ref 3.6–5.1)
Alkaline phosphatase (APISO): 81 U/L (ref 40–115)
BUN: 18 mg/dL (ref 7–25)
CALCIUM: 9.5 mg/dL (ref 8.6–10.3)
CO2: 29 mmol/L (ref 20–32)
CREATININE: 0.94 mg/dL (ref 0.60–1.35)
Chloride: 105 mmol/L (ref 98–110)
GFR, EST AFRICAN AMERICAN: 122 mL/min/{1.73_m2} (ref >=60)
GFR, EST NON AFRICAN AMERICAN: 105 mL/min/{1.73_m2} (ref >=60)
GLUCOSE: 116 mg/dL (ref 65–139)
Globulin: 2.5 g/dL (calc) (ref 1.9–3.7)
POTASSIUM: 3.9 mmol/L (ref 3.5–5.3)
Sodium: 142 mmol/L (ref 135–146)
TOTAL PROTEIN: 6.7 g/dL (ref 6.1–8.1)
Total Bilirubin: 0.5 mg/dL (ref 0.2–1.2)

## 2017-09-09 LAB — TSH: TSH: 1.85 mIU/L (ref 0.40–4.50)

## 2017-09-09 LAB — CBC
HEMATOCRIT: 42.3 % (ref 38.5–50.0)
Hemoglobin: 14.3 g/dL (ref 13.2–17.1)
MCH: 29.7 pg (ref 27.0–33.0)
MCHC: 33.8 g/dL (ref 32.0–36.0)
MCV: 87.9 fL (ref 80.0–100.0)
MPV: 9.8 fL (ref 7.5–12.5)
Platelets: 303 10*3/uL (ref 140–400)
RBC: 4.81 10*6/uL (ref 4.20–5.80)
RDW: 12 % (ref 11.0–15.0)
WBC: 7.7 10*3/uL (ref 3.8–10.8)

## 2017-09-09 LAB — HIV ANTIBODY (ROUTINE TESTING W REFLEX): HIV 1&2 Ab, 4th Generation: NONREACTIVE

## 2017-09-09 MED ORDER — PREDNISONE 5 MG PO TABS: ORAL_TABLET | ORAL | 0 refills | Status: DC

## 2017-09-09 MED ORDER — DOXYCYCLINE HYCLATE 100 MG PO TABS: 100.0000 mg | ORAL_TABLET | Freq: Two times a day (BID) | ORAL | 0 refills | Status: AC

## 2017-09-09 MED ORDER — BENZONATATE 100 MG PO CAPS: 100.0000 mg | ORAL_CAPSULE | Freq: Two times a day (BID) | ORAL | 0 refills | Status: DC | PRN

## 2017-09-09 NOTE — Assessment & Plan Note (Signed)
Sleep hygiene reviewed and written information offered also. Prescription sent for  medication needed. Controlled, no change in medication  

## 2017-09-09 NOTE — Assessment & Plan Note (Signed)
Controlled, no change in medication DASH diet and commitment to daily physical activity for a minimum of 30 minutes discussed and encouraged, as a part of hypertension management. The importance of attaining a healthy weight is also discussed.  BP/Weight 09/09/2017 04/10/2017 03/13/2017 10/03/2016 04/04/2016 09/13/2015 04/04/2015  Systolic BP 128 110 112 116 128 124 130  Diastolic BP 80 80 74 80 72 84 88  Wt. (Lbs) 363 354.75 355.04 351 340 333.12 335  BMI 47.89 46.8 46.84 46.31 44.86 43.96 44.21  Some encounter information is confidential and restricted. Go to Review Flowsheets activity to see all data.

## 2017-09-09 NOTE — Patient Instructions (Addendum)
F/u in 4 months, call if you need me sooner  Labs today with non fast lipids   Medications are sent for infected cyst and uncontrolled allergies  Encourage  You to take up the water and natural food diet, you will definitely be healthier  Important to get sleep study, Dr Ronal Fearoonquah's office will call with appt [facility] you  [facility]. We consider it a privelige to serve you.  Delivering excellent health care in a caring and  compassionate way is our goal.  Partnering with you,  so that together we can achieve this goal is our strategy.

## 2017-09-09 NOTE — Assessment & Plan Note (Signed)
Doxycycline x 5 days

## 2017-09-09 NOTE — Assessment & Plan Note (Signed)
[name]. Patient re-educated about  the importance of commitment to a  minimum of 150 minutes of exercise per week.  The importance of healthy food choices with portion control discussed. Encouraged to start a food diary, count calories and to consider  joining a support group. Sample diet sheets offered. Goals set by the patient for the next several months.   Weight /BMI 09/09/2017 04/10/2017 03/13/2017  WEIGHT 363 lb 354 lb 12 oz 355 lb 0.6 oz  HEIGHT 6\' 1"  6\' 1"  6\' 1"   BMI 47.89 kg/m2 46.8 kg/m2 46.84 kg/m2  Some encounter information is confidential and restricted. Go to Review Flowsheets activity to see all data.

## 2017-09-09 NOTE — Assessment & Plan Note (Signed)
Uncontrolled, 5 day course of prednisone and tessalon perles

## 2017-09-09 NOTE — Addendum Note (Signed)
Addended by: Abner GreenspanHUDY, [place] H on: 09/09/2017 08:42 AM   Modules accepted: Orders

## 2017-09-09 NOTE — Progress Notes (Signed)
   Scott NephewJefferey L Mendez     MRN: [id number]      DOB: Jul 26, 1981   HPI Scott Mendez is here for  Annual exam c/o runny nose and excess cough x  X 10 days , no fever, chill , no reduced activity , no change in appetite. Bump on side of left eye x 4 days   ROS  Denies chest pains, palpitations and leg swelling Denies abdominal pain, nausea, vomiting,diarrhea or constipation.   Denies dysuria, frequency, hesitancy or incontinence. Denies joint pain, swelling and limitation in mobility. Denies headaches, seizures, numbness, or tingling. Denies depression, anxiety or insomnia. Denies skin break down or rash.   PE  BP 128/80   Pulse (!) 101   Resp 16   Ht 6\' 1"  (1.854 m)   Wt (!) 363 lb (164.7 kg)   SpO2 94%   BMI 47.89 kg/m   Patient alert and oriented and in no cardiopulmonary distress.  HEENT: No facial asymmetry, EOMI,   oropharynx pink and moist.  Neck supple no JVD, no mass.Nasal mucosa edematous and erythematous and edematous ,  Chest: Clear to auscultation bilaterally.  CVS: S1, S2 no murmurs, no S3.Regular rate.  ABD: Soft non tender.   Ext: No edema  MS: Adequate ROM spine, shoulders, hips and knees.  Skin: Intact, no ulcerations or rash noted.Cyst on ;lateral aspect of left eye, mildly erythematous and tender  Psych: Good eye contact,  not anxious or depressed appearing.  CNS: CN 2-12 intact, power,  normal throughout.no focal deficits noted.   Assessment & Plan Essential hypertension Controlled, no change in medication DASH diet and commitment to daily physical activity for a minimum of 30 minutes discussed and encouraged, as a part of hypertension management. The importance of attaining a healthy weight is also discussed.  BP/Weight 09/09/2017 04/10/2017 03/13/2017 10/03/2016 04/04/2016 09/13/2015 04/04/2015  Systolic BP 128 110 112 116 128 124 130  Diastolic BP 80 80 74 80 72 84 88  Wt. (Lbs) 363 354.75 355.04 351 340 333.12 335  BMI 47.89 46.8 46.84 46.31  44.86 43.96 44.21  Some encounter information is confidential and restricted. Go to Review Flowsheets activity to see all data.       Seasonal allergies Uncontrolled, 5 day course of prednisone and tessalon perles  Insomnia Sleep hygiene reviewed and written information offered also. Prescription sent for  medication needed. Controlled, no change in medication   Morbid obesity [name]. Patient re-educated about  the importance of commitment to a  minimum of 150 minutes of exercise per week.  The importance of healthy food choices with portion control discussed. Encouraged to start a food diary, count calories and to consider  joining a support group. Sample diet sheets offered. Goals set by the patient for the next several months.   Weight /BMI 09/09/2017 04/10/2017 03/13/2017  WEIGHT 363 lb 354 lb 12 oz 355 lb 0.6 oz  HEIGHT 6\' 1"  6\' 1"  6\' 1"   BMI 47.89 kg/m2 46.8 kg/m2 46.84 kg/m2  Some encounter information is confidential and restricted. Go to Review Flowsheets activity to see all data.      Snoring Excess snoring , needs to be tested for sleep apnea  Infected cyst of skin Doxycycline x 5 days

## 2017-09-09 NOTE — Assessment & Plan Note (Signed)
Excess snoring , needs to be tested for sleep apnea

## 2017-09-10 ENCOUNTER — Encounter: Payer: Self-pay | Admitting: Family Medicine

## 2017-09-12 ENCOUNTER — Encounter: Payer: Self-pay | Admitting: Family Medicine

## 2017-10-03 ENCOUNTER — Other Ambulatory Visit: Payer: Self-pay | Admitting: Family Medicine

## 2017-10-14 ENCOUNTER — Encounter: Payer: Self-pay | Admitting: Neurology

## 2017-10-14 ENCOUNTER — Ambulatory Visit (INDEPENDENT_AMBULATORY_CARE_PROVIDER_SITE_OTHER): Payer: Medicare Other | Admitting: Neurology

## 2017-10-14 VITALS — BP 149/98 | HR 95 | Ht 73.0 in | Wt 357.0 lb

## 2017-10-14 DIAGNOSIS — G4727 Circadian rhythm sleep disorder in conditions classified elsewhere: Secondary | ICD-10-CM

## 2017-10-14 DIAGNOSIS — G4722 Circadian rhythm sleep disorder, advanced sleep phase type: Secondary | ICD-10-CM

## 2017-10-14 DIAGNOSIS — R351 Nocturia: Secondary | ICD-10-CM

## 2017-10-14 DIAGNOSIS — I10 Essential (primary) hypertension: Secondary | ICD-10-CM

## 2017-10-14 DIAGNOSIS — R0683 Snoring: Secondary | ICD-10-CM | POA: Diagnosis not present

## 2017-10-14 MED ORDER — HYDROCHLOROTHIAZIDE 25 MG PO TABS: ORAL_TABLET | ORAL | 5 refills | Status: DC

## 2017-10-14 NOTE — Progress Notes (Signed)
SLEEP MEDICINE CLINIC   Provider:  Melvyn Novas, M D  Primary Care Physician:  Kerri Perches, MD   Referring Provider: Kerri Perches, MD    Chief Complaint  Patient presents with  . New Patient (Initial Visit)    pt with mother, pt unable is autistic and unable to speak, mother states that the pt snores in sleep and that his father had sleep apnea.     HPI:  Scott Mendez is a 37 y.o. male , seen here as a new patient in a referral from Dr.M. Simpson for sleep disordered breathing .  [name] is autistic.   [name] has noticed that her son snores loudly she is not so quite sure if he actually stops breathing at night, his father had sleep apnea.  [name] does struggle with a high body mass index, body mass index at the last visit with his primary care on 09 September 2017 was 48, total weight is 363 pounds but he is also a tall gentleman 6 foot 1.  He occasionally has tachycardia, upper respiratory infections, coughing.  His blood pressures have been measured and documented in [facility] and are normal.  He is controlled on a DASH diet physical activity for 30 minutes daily, and he has no documented history of heart disease.    Chief complaint according to patient :  Sleep habits are as follows: he attends a day care center- comes home about 3 Pm and takes a 1 hour nap, supper is at 5 Pm and bedtime around 6 PM -  He takes trazodone at 5 PM- up he is up and around again at 3 AM. He uses a diuretic, mother gives it sometimes in the afternoon- and this must stop- he may have nocturia because of the late time for diuretic.  He will stay up after that time- and his mother with him . Mother noted him to have crescendo snoring, very loudly. He has 2 -3 nocturias within his sleep time. He leaves the house at 8 AM again.   Watches TV and plays music at night in his bedroom, all his life.    Sleep medical history and family sleep history: no tonsillectomy ,  morbid obesity for all his life.  His main growth spurt was late at age 66-24 , according to mother He gained weight continuously all his life.   Social history:  Lives with parents, no tobacco or alcohol use, on verbal. Not following simple commands. Agitated.  Jazmine drinks juice, water , not soft drinks. No coffee or tea.   Review of Systems: Out of a complete 14 system review, the patient complains of only the following symptoms, and all other reviewed systems are negative.  Anxiety, restlessness, agitation, pounding his chest, hitting he is head . Snoring.   Epworth score not applicable for MRDD patient  8 / 18 points  , Fatigue severity score 12 points ( mother's assessement)   , depression score N/A   Social History   Socioeconomic History  . Marital status: Single    Spouse name: Not on file  . Number of children: Not on file  . Years of education: Not on file  . Highest education level: Not on file  Social Needs  . Financial resource strain: Not on file  . Food insecurity - worry: Not on file  . Food insecurity - inability: Not on file  . Transportation needs - medical: Not on file  . Transportation needs -  non-medical: Not on file  Occupational History  . Not on file  Tobacco Use  . Smoking status: Never Smoker  . Smokeless tobacco: Never Used  Substance and Sexual Activity  . Alcohol use: No  . Drug use: No  . Sexual activity: No  Other Topics Concern  . Not on file  Social History Narrative  . Not on file    Family History  Problem Relation Age of Onset  . Hypertension Mother   . Hypertension Father   . Diabetes Father   . Heart disease Maternal Grandfather   . Diabetes Paternal Grandmother   . Seizures Maternal Uncle   . CVA Maternal Grandmother   . Heart disease Paternal Grandfather   . ADD / ADHD Neg Hx   . Alcohol abuse Neg Hx   . Drug abuse Neg Hx   . Anxiety disorder Neg Hx   . Bipolar disorder Neg Hx   . Depression Neg Hx   . Dementia Neg  Hx   . OCD Neg Hx   . Paranoid behavior Neg Hx   . Schizophrenia Neg Hx   . Sexual abuse Neg Hx   . Physical abuse Neg Hx     Past Medical History:  Diagnosis Date  . Allergy   . Autism   . Autism 1986  . Hypertension   . Mental retardation   . Morbid obesity (HCC) 2000    Past Surgical History:  Procedure Laterality Date  . toenail removal    . TOOTH EXTRACTION Right 09/03/2014   Procedure: EXTRACTION MOLAR LOWER RIGHT;  Surgeon: Georgia Lopes, DDS;  Location: [facility] OR;  Service: Oral Surgery;  Laterality: Right;    Current Outpatient Medications  Medication Sig Dispense Refill  . benzonatate (TESSALON) 100 MG capsule Take 1 capsule (100 mg total) by mouth 2 (two) times daily as needed for cough. 20 capsule 0  . fluticasone (FLONASE) 50 MCG/ACT nasal spray INSTILL 2 SPRAYS INTO EACH NOSTRIL DAILY. 48 g 1  . hydrochlorothiazide (HYDRODIURIL) 25 MG tablet TAKE 1 TABLET BY MOUTH ONCE A DAY. 30 tablet 0  . loratadine (CLARITIN) 10 MG tablet TAKE ONE TABLET BY MOUTH ONCE DAILY. 30 tablet 0  . montelukast (SINGULAIR) 10 MG tablet TAKE ONE TABLET BY MOUTH AT BEDTIME. 30 tablet 0  . potassium chloride SA (K-DUR,KLOR-CON) 20 MEQ tablet TAKE (1) TABLET BY MOUTH ONCE DAILY. 30 tablet 0  . traZODone (DESYREL) 50 MG tablet TAKE 1/2 TO 1 TABLET BY MOUTH AT BEDTIME AS NEEDED FOR SLEEP. 90 tablet 0   No current facility-administered medications for this visit.     Allergies as of 10/14/2017  . (No Known Allergies)    Vitals: BP (!) 149/98   Pulse 95   Ht 6\' 1"  (1.854 m)   Wt (!) 357 lb (161.9 kg)   BMI 47.10 kg/m  Last Weight:  Wt Readings from Last 1 Encounters:  10/14/17 (!) 357 lb (161.9 kg)   ZOX:WRUE mass index is 47.1 kg/m.     Last Height:   Ht Readings from Last 1 Encounters:  10/14/17 6\' 1"  (1.854 m)   Last weight in Feb 2019 without shoes was 363 pounds.  BMI 48.  Physical exam:  General: The patient is awake, alert and appears not in acute distress. The patient is  well groomed. Head: Normocephalic, atraumatic. Neck is supple. Mallampati 5, large tongue,   neck circumference: 20 " . Nasal airflow restricted , TMJ is not evident . Retrognathia is seen.  Cardiovascular:  Regular rate and rhythm, without  murmurs or carotid bruit, and without distended neck veins. Respiratory: Lungs are clear to auscultation. Skin:  Without evidence of edema, or rash Trunk: BMI is 48. The patient's posture is erect.   Neurologic exam : The patient is awake and alert, but may not be oriented to place and time.   No able to determine memory function, non verbal, MRDD,    Cranial nerves: Pupils are equal and briskly reactive to light. patietn did not cooperate with funduscopic exam / Extraocular movements /Visual field. Hearing to finger rub intact.  Facial sensation intact to fine touch. Facial motor strength is symmetric and tongue and uvula move midline. Shoulder shrug was symmetrical.  Motor exam: decreased muscle tone,  large muscle bulk and symmetric strength in all extremities. He provided good grip strength but cannot follow instructions for more than 1 step commands. He shrugs both shoulders, has restricted ROM for his neck rotation.  Sensory:  Fine touch, pinprick and vibration were tested in all extremities. Coordination: deferred- but by observation without evidence of ataxia, dysmetria or tremor. Gait and station: Patient walks without assistive device  He walks with a wide base, was late at learning to walk at 15 month.Turns with 4-5   Steps. Romberg testing is negative. Deep tendon reflexes: in the  upper and lower extremities are symmetrically attenuated.  He has a large bulk, appears relaxed but I cannot elicit biceps, triceps nor patella DTR. Babinski maneuver response is downgoing.   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    2)Snoring : It will be impossible to evaluate Keno in a strange environment for sleep apnea- and treatment can be equally challenging.   3) morbid obesity with nocturia - estimated 6-7 hours of night time sleep - very god for an autistic patient. He has established routines at home. He is one of three children, and has one adopted sibling.    The patient was advised of the nature of the diagnosed disorder , the treatment options and the  risks for general health and wellness arising from not treating the condition.   I spent more than 50  minutes of face to face time with the patient.  Greater than 50% of time was spent in counseling and coordination of care. We have discussed the diagnosis and differential and I answered the patient's questions.    Plan:  Dear Dr. Lodema HongSimpson,   Treatment plan and additional workup :A Home sleep test will be ordered. It will be possible to obtain and learn about the diagnosis, it may not be possible to treat.  OSA  If found - it is highly unlikely that Wm. Wrigley Jr. CompanyJeffrey L Chery will tolerate CPAP treatment should apnea be diagnosed-  it would be equally unlikely that he would tolerate a dental device.   Our best approach may be to work on the patient's body mass index if he loses weight he is less likely to snore and also less likely to have a severe degree of apnea. I will give his mom some additional information also about sleep hygiene, setting sleep routines.   For an autistic young adult he has a remarkable routine-  day by day  schedule - and he gets about 7 hours of nocturnal sleep by mother's estimate, which is very good.   Melvyn Novas, MD 10/14/2017, 10:21 AM  Certified in Neurology by ABPN Certified in Sleep Medicine by [facility] [address] [place], [place] [address]

## 2017-10-14 NOTE — Patient Instructions (Signed)
Please remember to try to maintain good sleep hygiene, which means: Keep a regular sleep and wake schedule, try not to exercise or have a meal within 2 hours of your bedtime, try to keep your bedroom conducive for sleep, that is, cool and dark, without light distractors such as an illuminated alarm clock, and refrain from watching TV right before sleep or in the middle of the night and do not keep the TV or radio on during the night. Also, try not to use or play on electronic devices at bedtime, such as your cell phone, tablet PC or laptop. If you like to read at bedtime on an electronic device, try to dim the background light as much as possible. Do not eat in the middle of the night.   We will request a sleep study at home for You .    We will look for leg twitching and snoring or sleep apnea.   For chronic insomnia, you are best followed by a psychiatrist and/or sleep psychologist.   We will call you with the sleep study results and make a follow up appointment if needed.

## 2017-10-30 ENCOUNTER — Ambulatory Visit (INDEPENDENT_AMBULATORY_CARE_PROVIDER_SITE_OTHER): Payer: Medicare Other | Admitting: Neurology

## 2017-10-30 DIAGNOSIS — G4733 Obstructive sleep apnea (adult) (pediatric): Secondary | ICD-10-CM

## 2017-10-30 DIAGNOSIS — R0683 Snoring: Secondary | ICD-10-CM

## 2017-10-30 DIAGNOSIS — I10 Essential (primary) hypertension: Secondary | ICD-10-CM

## 2017-10-30 DIAGNOSIS — R351 Nocturia: Secondary | ICD-10-CM

## 2017-10-30 DIAGNOSIS — G4727 Circadian rhythm sleep disorder in conditions classified elsewhere: Secondary | ICD-10-CM

## 2017-10-30 DIAGNOSIS — G4722 Circadian rhythm sleep disorder, advanced sleep phase type: Secondary | ICD-10-CM

## 2017-11-07 ENCOUNTER — Telehealth: Payer: Self-pay | Admitting: Neurology

## 2017-11-07 NOTE — Procedures (Signed)
NAME: Scott Mendez                                                         DOB: 11-27-1980 MEDICAL RECORD [id number]                                                  DOS: 10/30/2017 REFERRING PHYSICIAN: Syliva OvermanMargaret Simpson, M.D. STUDY PERFORMED: Home Sleep Test on watch pat HISTORY: Trevor MaceJeffery L Noble is a 37 y.o. autistic male , seen for sleep disordered breathing Mrs. Rota has noticed that her son snores loudly; she is not so quite sure if he actually stops breathing at night, but his father had sleep apnea.  Mr. Barnabas ListerWashington does struggle with a high body mass index of 47.3, total weight is 363 pounds, but he is also a tall gentleman 6 foot 1.  He occasionally has tachycardia, upper respiratory infections, coughing. He is controlled on a DASH diet physical activity for 30 minutes daily, and he has no documented history of heart disease. Epworth score for MRDD patient, modified at 8 / 18 points, Fatigue severity score 12 points   STUDY RESULTS:  Total Recording Time: 5 h, 13 m. Total valid test time 3 h 58 min.  Total Apnea/Hypopnea Index (AHI) was 17.5 /h; RDI was 18.3 /h Average Oxygen Saturation:  94 %, Lowest Oxygen Desaturation measured at 54 %*  Total Time in Oxygen Saturation below 89% was 9.2 minutes.  Average Heart Rate: 84 bpm (between 44 and 117 bpm), NSR. 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 2) tachy-brady arrhythmia.  (54% Hypoxemia likely artefact *)  RECOMMENDATION: This apnea is still mild and may further improve with weight loss. It is however necessary to treat: If Mr. Pankratz can tolerate CPAP, I would like to try auto PAP with him. A dental device is an alternative option but requires cooperation, too.  If this is unlikely, I would ask him to sleep on his side and concentrate on weight loss.  I certify that I have reviewed the raw data recording prior to the issuance of this report in accordance with the standards of  the American Academy of Sleep Medicine (AASM).   Melvyn Novasarmen Cleora Karnik, M.D.          11-07-2017   Medical Director of [facility], accredited by AASM Diplomat of the ABPN and ABSM

## 2017-11-07 NOTE — Telephone Encounter (Signed)
-----   Message from Melvyn Novasarmen Dohmeier, MD sent at 11/07/2017  8:37 AM EDT ----- Cc Dr Lodema HongSimpson.  Patient with autism, developmental delay.  IMPRESSION: 1) Mild sleep apnea with moderate- loud snoring, no  prolonged hypoxemia. 2) tachy-brady arrhythmia.  (54% Hypoxemia likely artefact #)  RECOMMENDATION: This apnea is still mild and may further improve  with weight loss. It is however necessary to treat: If [name] can tolerate  CPAP, I would like to try auto PAP with him. A dental device is  an alternative option but requires cooperation, too.  If this is unlikely, I would ask him to sleep on his side and  concentrate on weight loss.

## 2017-11-07 NOTE — Telephone Encounter (Signed)
Called patient's mother to discuss sleep study results. No answer at this time. LVM for the patient's mom to call back.

## 2017-11-07 NOTE — Telephone Encounter (Signed)
Patients mother called back. I went over the sleep study results and explained the results on the test. I have informed the mother that Dr Vickey Hugerohmeier had 3 recommendations. I informed the patient that she would first recommend CPAP and I educated on what the was. The mother didn't feel he would be able to do that. The next option was dental device. I did educate also what this would mean and how he would have to wear it in sleep as well. Then I offered the last recommendation and educated keeping him on his side in sleep and weight loss and how much that can help alone. The mother verbalized understanding. I informed her that is she chooses to do either the dental device or cpap option to call us and we can get the set up for him. Pt's mom had no questions at this time but was encouraged to call back if questions arise.

## 2017-12-05 ENCOUNTER — Other Ambulatory Visit: Payer: Self-pay | Admitting: Family Medicine

## 2018-01-07 ENCOUNTER — Ambulatory Visit: Payer: Self-pay | Admitting: Family Medicine

## 2018-01-07 ENCOUNTER — Other Ambulatory Visit: Payer: Self-pay | Admitting: Family Medicine

## 2018-01-20 ENCOUNTER — Encounter: Payer: Self-pay | Admitting: Family Medicine

## 2018-01-20 ENCOUNTER — Ambulatory Visit (INDEPENDENT_AMBULATORY_CARE_PROVIDER_SITE_OTHER): Payer: Medicare Other | Admitting: Family Medicine

## 2018-01-20 VITALS — BP 140/94 | HR 94 | Resp 16 | Ht 73.0 in | Wt 357.0 lb

## 2018-01-20 DIAGNOSIS — I10 Essential (primary) hypertension: Secondary | ICD-10-CM | POA: Diagnosis not present

## 2018-01-20 DIAGNOSIS — Z13220 Encounter for screening for lipoid disorders: Secondary | ICD-10-CM

## 2018-01-20 DIAGNOSIS — K029 Dental caries, unspecified: Secondary | ICD-10-CM | POA: Diagnosis not present

## 2018-01-20 DIAGNOSIS — J302 Other seasonal allergic rhinitis: Secondary | ICD-10-CM

## 2018-01-20 MED ORDER — OLMESARTAN MEDOXOMIL-HCTZ 20-12.5 MG PO TABS: 1.0000 | ORAL_TABLET | Freq: Every day | ORAL | 3 refills | Status: DC

## 2018-01-20 MED ORDER — POTASSIUM CHLORIDE CRYS ER 20 MEQ PO TBCR: EXTENDED_RELEASE_TABLET | ORAL | 5 refills | Status: DC

## 2018-01-20 MED ORDER — TRAZODONE HCL 50 MG PO TABS: 25.0000 mg | ORAL_TABLET | Freq: Every evening | ORAL | 1 refills | Status: DC | PRN

## 2018-01-20 MED ORDER — MONTELUKAST SODIUM 10 MG PO TABS: 10.0000 mg | ORAL_TABLET | Freq: Every day | ORAL | 5 refills | Status: DC

## 2018-01-20 MED ORDER — IBUPROFEN 800 MG PO TABS: 800.0000 mg | ORAL_TABLET | Freq: Three times a day (TID) | ORAL | 0 refills | Status: DC | PRN

## 2018-01-20 MED ORDER — LORATADINE 10 MG PO TABS: 10.0000 mg | ORAL_TABLET | Freq: Every day | ORAL | 5 refills | Status: DC

## 2018-01-20 MED ORDER — FLUTICASONE PROPIONATE 50 MCG/ACT NA SUSP: NASAL | 1 refills | Status: DC

## 2018-01-20 NOTE — Progress Notes (Signed)
Scott Mendez     MRN: [id number]      DOB: 06/01/1981   HPI Scott Mendez is here for follow up and re-evaluation of chronic medical conditions, medication management and review of any available recent lab and radiology data.  Preventive health is updated, specifically  Cancer screening and Immunization.   Currently having dental pain and is to have extraction in the next 2 weeks, requesting a refill  On ibuprofen and is currently taking antibiotic. Mother denies any fever or change in appetite because of his dental problems No significant change in eating habit, mother still has not changed his food environment unfortunately. No other concerns Was evaluated for sleep apnea,and though he does have mild slepp apnea, wearnin a mask is not an option The PT denies any adverse reactions to current medications since the last visit.   ROS Denies recent fever or chills. Denies sinus pressure, nasal congestion, ear pain or sore throat. Denies chest congestion, productive cough or wheezing. Denies chest pains, palpitations and leg swelling Denies abdominal pain, nausea, vomiting,diarrhea or constipation.   Denies dysuria, frequency, hesitancy or incontinence. Denies joint pain, swelling and limitation in mobility. Denies headaches, seizures, numbness, or tingling. Denies depression, anxiety or insomnia. Denies skin break down or rash.   PE  BP (!) 140/94   Pulse (!) 108   Resp 16   Ht 6\' 1"  (1.854 m)   Wt (!) 357 lb (161.9 kg)   SpO2 94%   BMI 47.10 kg/m   Patient alert and in no cardiopulmonary distress.  HEENT: No facial asymmetry, EOMI,   oropharynx pink and moist.  Neck supple no JVD, no mass.  Chest: Clear to auscultation bilaterally.  CVS: S1, S2 no murmurs, no S3.Regular rate.  ABD: Soft non tender.   Ext: No edema  MS: Adequate ROM spine, shoulders, hips and knees.  Skin: Intact, no ulcerations or rash noted.  Psych:  not anxious or depressed  appearing.  CNS: , power,  normal throughout.   Assessment & Plan  Essential hypertension Uncontrolled , discontinue hCTZ and start benicar/ hCTZ 20/12.5 one daily DASH diet and commitment to daily physical activity for a minimum of 30 minutes discussed and encouraged, as a part of hypertension management. The importance of attaining a healthy weight is also discussed.  BP/Weight 01/20/2018 10/14/2017 09/09/2017 04/10/2017 03/13/2017 10/03/2016 04/04/2016  Systolic BP 140 149 128 110 112 116 128  Diastolic BP 94 98 80 80 74 80 72  Wt. (Lbs) 357 357 363 354.75 355.04 351 340  BMI 47.1 47.1 47.89 46.8 46.84 46.31 44.86  Some encounter information is confidential and restricted. Go to Review Flowsheets activity to see all data.       Morbid obesity improved Patient re-educated about  the importance of commitment to a  minimum of 150 minutes of exercise per week.  The importance of healthy food choices with portion control discussed. Encouraged to start a food diary, count calories and to consider  joining a support group. Sample diet sheets offered. Goals set by the patient for the next several months.   Weight /BMI 01/20/2018 10/14/2017 09/09/2017  WEIGHT 357 lb 357 lb 363 lb  HEIGHT 6\' 1"  6\' 1"  6\' 1"   BMI 47.1 kg/m2 47.1 kg/m2 47.89 kg/m2  Some encounter information is confidential and restricted. Go to Review Flowsheets activity to see all data.      Pain due to dental caries Has upcoming extraction , currently on antibiotic by dentist, ibuprofen is refilled  Seasonal allergies Controlled, no change in medication

## 2018-01-20 NOTE — Patient Instructions (Addendum)
F/u in 6 to 8 weeks, call if you need me sooner  Please stop the HCTZ. Instead you are on Benicar/ HCTZ 20/12.5 one daily for blood pressure, your blood pressure is high  Work on cutting back portion size and stopping sugar , you need to lose weight. CONGRATS on 6 pound weight loss  All the best with dental extraction!!  Thank you  for choosing  Primary Care. We consider it a privelige to serve you.  Delivering excellent health care in a caring and  compassionate way is our goal.  Partnering with you,  so that together we can achieve this goal is our strategy.

## 2018-01-21 ENCOUNTER — Encounter: Payer: Self-pay | Admitting: Family Medicine

## 2018-01-21 DIAGNOSIS — K029 Dental caries, unspecified: Secondary | ICD-10-CM | POA: Insufficient documentation

## 2018-01-21 NOTE — Assessment & Plan Note (Signed)
Controlled, no change in medication  

## 2018-01-21 NOTE — Assessment & Plan Note (Signed)
improved Patient re-educated about  the importance of commitment to a  minimum of 150 minutes of exercise per week.  The importance of healthy food choices with portion control discussed. Encouraged to start a food diary, count calories and to consider  joining a support group. Sample diet sheets offered. Goals set by the patient for the next several months.   Weight /BMI 01/20/2018 10/14/2017 09/09/2017  WEIGHT 357 lb 357 lb 363 lb  HEIGHT 6\' 1"  6\' 1"  6\' 1"   BMI 47.1 kg/m2 47.1 kg/m2 47.89 kg/m2  Some encounter information is confidential and restricted. Go to Review Flowsheets activity to see all data.

## 2018-01-21 NOTE — Assessment & Plan Note (Signed)
Has upcoming extraction , currently on antibiotic by dentist, ibuprofen is refilled

## 2018-01-21 NOTE — Assessment & Plan Note (Signed)
Uncontrolled , discontinue hCTZ and start benicar/ hCTZ 20/12.5 one daily DASH diet and commitment to daily physical activity for a minimum of 30 minutes discussed and encouraged, as a part of hypertension management. The importance of attaining a healthy weight is also discussed.  BP/Weight 01/20/2018 10/14/2017 09/09/2017 04/10/2017 03/13/2017 10/03/2016 04/04/2016  Systolic BP 140 149 128 110 112 116 128  Diastolic BP 94 98 80 80 74 80 72  Wt. (Lbs) 357 357 363 354.75 355.04 351 340  BMI 47.1 47.1 47.89 46.8 46.84 46.31 44.86  Some encounter information is confidential and restricted. Go to Review Flowsheets activity to see all data.

## 2018-02-03 DIAGNOSIS — K0889 Other specified disorders of teeth and supporting structures: Secondary | ICD-10-CM

## 2018-02-03 HISTORY — DX: Other specified disorders of teeth and supporting structures: K08.89

## 2018-02-27 ENCOUNTER — Encounter (HOSPITAL_BASED_OUTPATIENT_CLINIC_OR_DEPARTMENT_OTHER): Payer: Self-pay | Admitting: *Deleted

## 2018-02-27 ENCOUNTER — Other Ambulatory Visit: Payer: Self-pay

## 2018-02-27 NOTE — Pre-Procedure Instructions (Signed)
To come for BMET, EKG and anesthesia airway consult.

## 2018-02-28 DIAGNOSIS — K029 Dental caries, unspecified: Secondary | ICD-10-CM | POA: Insufficient documentation

## 2018-03-03 ENCOUNTER — Encounter (HOSPITAL_BASED_OUTPATIENT_CLINIC_OR_DEPARTMENT_OTHER)
Admission: RE | Admit: 2018-03-03 | Discharge: 2018-03-03 | Disposition: A | Discharge status: Home / Self Care | Payer: Medicare Other | Source: Ambulatory Visit | Attending: Oral Surgery | Admitting: Oral Surgery

## 2018-03-03 ENCOUNTER — Other Ambulatory Visit: Payer: Self-pay

## 2018-03-03 DIAGNOSIS — Z01810 Encounter for preprocedural cardiovascular examination: Secondary | ICD-10-CM | POA: Diagnosis not present

## 2018-03-03 DIAGNOSIS — Z79899 Other long term (current) drug therapy: Secondary | ICD-10-CM | POA: Diagnosis not present

## 2018-03-03 DIAGNOSIS — K029 Dental caries, unspecified: Secondary | ICD-10-CM | POA: Diagnosis not present

## 2018-03-03 DIAGNOSIS — I10 Essential (primary) hypertension: Secondary | ICD-10-CM | POA: Diagnosis not present

## 2018-03-03 DIAGNOSIS — G4730 Sleep apnea, unspecified: Secondary | ICD-10-CM | POA: Diagnosis not present

## 2018-03-03 DIAGNOSIS — Z6841 Body Mass Index (BMI) 40.0 and over, adult: Secondary | ICD-10-CM | POA: Diagnosis not present

## 2018-03-03 NOTE — Progress Notes (Signed)
Patient and his mother in for pre admit work. Unable to get bloodwork therefore iStat will be done DOS. Anesthesia consult completed by Dr. Mal AmabileBrock and surgery will proceed as scheduled.

## 2018-03-03 NOTE — H&P (Signed)
HISTORY AND PHYSICAL  Scott Mendez is a 37 y.o. male patient mental disability, morbid obesity, with nonrestorable tooth #15.  No diagnosis found.  Past Medical History:  Diagnosis Date  . Autism   . Hypertension    states under control with meds., has been on med. x 8 yr.  . Morbid obesity with BMI of 45.0-49.9, adult (HCC)   . Nonrestorable tooth 02/2018   x 2  . Nonverbal   . Sleep apnea    mild - no CPAP    No current facility-administered medications for this encounter.    Current Outpatient Medications  Medication Sig Dispense Refill  . fluticasone (FLONASE) 50 MCG/ACT nasal spray INSTILL 2 SPRAYS INTO EACH NOSTRIL DAILY. 48 g 1  . loratadine (CLARITIN) 10 MG tablet Take 1 tablet (10 mg total) by mouth daily. 30 tablet 5  . montelukast (SINGULAIR) 10 MG tablet Take 1 tablet (10 mg total) by mouth at bedtime. 30 tablet 5  . olmesartan-hydrochlorothiazide (BENICAR HCT) 20-12.5 MG tablet Take 1 tablet by mouth daily. 30 tablet 3  . potassium chloride (K-DUR) 10 MEQ tablet Take 20 mEq by mouth daily.    . traZODone (DESYREL) 50 MG tablet Take 0.5-1 tablets (25-50 mg total) by mouth at bedtime as needed. for sleep 90 tablet 1   No Known Allergies Active Problems:   * No active hospital problems. *  Vitals: Height 6\' 1"  (1.854 m), weight (!) 158.8 kg (350 lb). Lab results:No results found for this or any previous visit (from the past 24 hour(s)). Radiology Results: No results found. General appearance: morbidly obese and slowed mentation Head: Normocephalic, without obvious abnormality, atraumatic Eyes: negative Nose: Nares normal. Septum midline. Mucosa normal. No drainage or sinus tenderness. Throat: lips, mucosa, and tongue normal; teeth and gums normal and gross decay tooth #15 Neck: no adenopathy, supple, symmetrical, trachea midline and thyroid not enlarged, symmetric, no tenderness/mass/nodules Resp: clear to auscultation bilaterally Cardio: regular rate  and rhythm, S1, S2 normal, no murmur, click, rub or gallop  Assessment: Nonrestorable tooth #15  Plan: Extract tooth #15. GA. Day surgery.   Ocie DoyneScott Chrisa Hassan 03/03/2018

## 2018-03-06 ENCOUNTER — Ambulatory Visit (HOSPITAL_BASED_OUTPATIENT_CLINIC_OR_DEPARTMENT_OTHER)
Admission: RE | Admit: 2018-03-06 | Discharge: 2018-03-06 | Disposition: A | Discharge status: Home / Self Care | Payer: Medicare Other | Source: Ambulatory Visit | Attending: Oral Surgery | Admitting: Oral Surgery

## 2018-03-06 ENCOUNTER — Ambulatory Visit (HOSPITAL_BASED_OUTPATIENT_CLINIC_OR_DEPARTMENT_OTHER): Payer: Medicare Other | Admitting: Anesthesiology

## 2018-03-06 ENCOUNTER — Other Ambulatory Visit: Payer: Self-pay

## 2018-03-06 ENCOUNTER — Encounter (HOSPITAL_BASED_OUTPATIENT_CLINIC_OR_DEPARTMENT_OTHER): Payer: Self-pay

## 2018-03-06 ENCOUNTER — Encounter (HOSPITAL_BASED_OUTPATIENT_CLINIC_OR_DEPARTMENT_OTHER)
Admission: RE | Disposition: A | Discharge status: Home / Self Care | Payer: Self-pay | Source: Ambulatory Visit | Attending: Oral Surgery

## 2018-03-06 DIAGNOSIS — Z6841 Body Mass Index (BMI) 40.0 and over, adult: Secondary | ICD-10-CM | POA: Diagnosis not present

## 2018-03-06 DIAGNOSIS — Z79899 Other long term (current) drug therapy: Secondary | ICD-10-CM | POA: Insufficient documentation

## 2018-03-06 DIAGNOSIS — F79 Unspecified intellectual disabilities: Secondary | ICD-10-CM | POA: Insufficient documentation

## 2018-03-06 DIAGNOSIS — G4730 Sleep apnea, unspecified: Secondary | ICD-10-CM | POA: Diagnosis not present

## 2018-03-06 DIAGNOSIS — I10 Essential (primary) hypertension: Secondary | ICD-10-CM | POA: Diagnosis not present

## 2018-03-06 DIAGNOSIS — F840 Autistic disorder: Secondary | ICD-10-CM | POA: Diagnosis not present

## 2018-03-06 DIAGNOSIS — K029 Dental caries, unspecified: Secondary | ICD-10-CM | POA: Insufficient documentation

## 2018-03-06 HISTORY — DX: Body Mass Index (BMI) 40.0 and over, adult: Z684

## 2018-03-06 HISTORY — DX: Other specified disorders of teeth and supporting structures: K08.89

## 2018-03-06 HISTORY — PX: TOOTH EXTRACTION: SHX859

## 2018-03-06 HISTORY — DX: Aphasia: R47.01

## 2018-03-06 HISTORY — DX: Morbid (severe) obesity due to excess calories: E66.01

## 2018-03-06 HISTORY — DX: Sleep apnea, unspecified: G47.30

## 2018-03-06 LAB — POCT I-STAT, CHEM 8
BUN: 23 mg/dL — ABNORMAL HIGH (ref 6–20)
CHLORIDE: 100 mmol/L (ref 98–111)
Calcium, Ion: 1.22 mmol/L (ref 1.15–1.40)
Creatinine, Ser: 0.9 mg/dL (ref 0.61–1.24)
Glucose, Bld: 89 mg/dL (ref 70–99)
HEMATOCRIT: 46 % (ref 39.0–52.0)
Hemoglobin: 15.6 g/dL (ref 13.0–17.0)
Potassium: 4.8 mmol/L (ref 3.5–5.1)
Sodium: 151 mmol/L — ABNORMAL HIGH (ref 135–145)
TCO2: 22 mmol/L (ref 22–32)

## 2018-03-06 SURGERY — DENTAL RESTORATION/EXTRACTIONS
Anesthesia: General | Site: Mouth

## 2018-03-06 MED ORDER — FENTANYL CITRATE (PF) 100 MCG/2ML IJ SOLN
INTRAMUSCULAR | Status: AC
  Filled 2018-03-06: qty 2

## 2018-03-06 MED ORDER — PROPOFOL 10 MG/ML IV BOLUS
INTRAVENOUS | Status: AC
  Filled 2018-03-06: qty 20

## 2018-03-06 MED ORDER — SUCCINYLCHOLINE CHLORIDE 20 MG/ML IJ SOLN
INTRAMUSCULAR | Status: DC | PRN
  Administered 2018-03-06: 120 mg via INTRAVENOUS

## 2018-03-06 MED ORDER — FENTANYL CITRATE (PF) 100 MCG/2ML IJ SOLN: 25.0000 ug | INTRAMUSCULAR | Status: DC | PRN

## 2018-03-06 MED ORDER — MIDAZOLAM HCL 2 MG/2ML IJ SOLN
1.0000 mg | INTRAMUSCULAR | Status: DC | PRN
  Administered 2018-03-06: 2 mg via INTRAVENOUS

## 2018-03-06 MED ORDER — CEFAZOLIN SODIUM-DEXTROSE 1-4 GM/50ML-% IV SOLN
INTRAVENOUS | Status: AC
  Filled 2018-03-06: qty 50

## 2018-03-06 MED ORDER — MIDAZOLAM HCL 2 MG/2ML IJ SOLN
INTRAMUSCULAR | Status: AC
  Filled 2018-03-06: qty 2

## 2018-03-06 MED ORDER — DEXAMETHASONE SODIUM PHOSPHATE 10 MG/ML IJ SOLN
INTRAMUSCULAR | Status: AC
  Filled 2018-03-06: qty 1

## 2018-03-06 MED ORDER — 0.9 % SODIUM CHLORIDE (POUR BTL) OPTIME
TOPICAL | Status: DC | PRN
  Administered 2018-03-06: 1000 mL

## 2018-03-06 MED ORDER — PROPOFOL 10 MG/ML IV BOLUS
INTRAVENOUS | Status: DC | PRN
  Administered 2018-03-06: 50 mg via INTRAVENOUS
  Administered 2018-03-06: 200 mg via INTRAVENOUS
  Administered 2018-03-06: 50 mg via INTRAVENOUS

## 2018-03-06 MED ORDER — LACTATED RINGERS IV SOLN
INTRAVENOUS | Status: DC
  Administered 2018-03-06: 13:00:00 via INTRAVENOUS

## 2018-03-06 MED ORDER — DEXTROSE 5 % IV SOLN
3.0000 g | INTRAVENOUS | Status: AC
  Administered 2018-03-06: 3 g via INTRAVENOUS

## 2018-03-06 MED ORDER — LIDOCAINE HCL (CARDIAC) PF 100 MG/5ML IV SOSY
PREFILLED_SYRINGE | INTRAVENOUS | Status: AC
  Filled 2018-03-06: qty 5

## 2018-03-06 MED ORDER — ONDANSETRON HCL 4 MG/2ML IJ SOLN
INTRAMUSCULAR | Status: AC
  Filled 2018-03-06: qty 2

## 2018-03-06 MED ORDER — HYDROCODONE-ACETAMINOPHEN 5-325 MG PO TABS: 2.0000 | ORAL_TABLET | Freq: Four times a day (QID) | ORAL | 0 refills | Status: DC | PRN

## 2018-03-06 MED ORDER — SODIUM CHLORIDE 0.9 % IV SOLN
INTRAVENOUS | Status: AC | PRN
  Administered 2018-03-06: 500 mL

## 2018-03-06 MED ORDER — ONDANSETRON HCL 4 MG/2ML IJ SOLN
INTRAMUSCULAR | Status: DC | PRN
  Administered 2018-03-06: 4 mg via INTRAVENOUS

## 2018-03-06 MED ORDER — SCOPOLAMINE 1 MG/3DAYS TD PT72: 1.0000 | MEDICATED_PATCH | Freq: Once | TRANSDERMAL | Status: DC | PRN

## 2018-03-06 MED ORDER — SUCCINYLCHOLINE CHLORIDE 200 MG/10ML IV SOSY
PREFILLED_SYRINGE | INTRAVENOUS | Status: AC
  Filled 2018-03-06: qty 10

## 2018-03-06 MED ORDER — DEXAMETHASONE SODIUM PHOSPHATE 4 MG/ML IJ SOLN
INTRAMUSCULAR | Status: DC | PRN
  Administered 2018-03-06: 10 mg via INTRAVENOUS

## 2018-03-06 MED ORDER — ONDANSETRON HCL 4 MG/2ML IJ SOLN: 4.0000 mg | Freq: Once | INTRAMUSCULAR | Status: DC | PRN

## 2018-03-06 MED ORDER — FENTANYL CITRATE (PF) 100 MCG/2ML IJ SOLN
50.0000 ug | INTRAMUSCULAR | Status: DC | PRN
  Administered 2018-03-06: 100 ug via INTRAVENOUS

## 2018-03-06 MED ORDER — LIDOCAINE HCL (CARDIAC) PF 100 MG/5ML IV SOSY
PREFILLED_SYRINGE | INTRAVENOUS | Status: DC | PRN
  Administered 2018-03-06: 100 mg via INTRAVENOUS

## 2018-03-06 MED ORDER — CEFAZOLIN SODIUM-DEXTROSE 2-4 GM/100ML-% IV SOLN
INTRAVENOUS | Status: AC
  Filled 2018-03-06: qty 100

## 2018-03-06 SURGICAL SUPPLY — 44 items
BANDAGE COBAN STERILE 2 (GAUZE/BANDAGES/DRESSINGS) ×3 IMPLANT
BLADE CRESCENTIC 13.5X.38X32 (BLADE) IMPLANT
BLADE SURG 15 STRL LF DISP TIS (BLADE) ×1 IMPLANT
BLADE SURG 15 STRL SS (BLADE) ×2
BUR CROSS CUT FISSURE 1.6 (BURR) ×1 IMPLANT
BUR CROSS CUT FISSURE 1.6MM (BURR) ×1
BUR EGG ELITE 4.0 (BURR) IMPLANT
BUR EGG ELITE 4.0MM (BURR)
CANISTER SUCT 1200ML W/VALVE (MISCELLANEOUS) ×3 IMPLANT
CATH ROBINSON RED A/P 10FR (CATHETERS) IMPLANT
COVER BACK TABLE 60X90IN (DRAPES) ×3 IMPLANT
COVER MAYO STAND STRL (DRAPES) ×3 IMPLANT
DECANTER SPIKE VIAL GLASS SM (MISCELLANEOUS) IMPLANT
DRAPE U-SHAPE 76X120 STRL (DRAPES) ×3 IMPLANT
GAUZE PACKING FOLDED 2  STR (GAUZE/BANDAGES/DRESSINGS) ×2
GAUZE PACKING FOLDED 2 STR (GAUZE/BANDAGES/DRESSINGS) ×1 IMPLANT
GAUZE PACKING IODOFORM 1/4X15 (GAUZE/BANDAGES/DRESSINGS) IMPLANT
GLOVE BIO SURGEON STRL SZ 6.5 (GLOVE) ×2 IMPLANT
GLOVE BIO SURGEON STRL SZ7.5 (GLOVE) ×3 IMPLANT
GLOVE BIO SURGEONS STRL SZ 6.5 (GLOVE) ×1
GLOVE BIOGEL PI IND STRL 7.0 (GLOVE) ×1 IMPLANT
GLOVE BIOGEL PI INDICATOR 7.0 (GLOVE) ×2
GOWN STRL REUS W/ TWL LRG LVL3 (GOWN DISPOSABLE) ×1 IMPLANT
GOWN STRL REUS W/ TWL XL LVL3 (GOWN DISPOSABLE) ×1 IMPLANT
GOWN STRL REUS W/TWL LRG LVL3 (GOWN DISPOSABLE) ×2
GOWN STRL REUS W/TWL XL LVL3 (GOWN DISPOSABLE) ×2
IV NS 500ML (IV SOLUTION) ×2
IV NS 500ML BAXH (IV SOLUTION) ×1 IMPLANT
NEEDLE HYPO 22GX1.5 SAFETY (NEEDLE) ×3 IMPLANT
PACK BASIN DAY SURGERY FS (CUSTOM PROCEDURE TRAY) ×3 IMPLANT
SLEEVE SCD COMPRESS KNEE MED (MISCELLANEOUS) IMPLANT
SPONGE SURGIFOAM ABS GEL 12-7 (HEMOSTASIS) IMPLANT
SUT CHROMIC 3 0 PS 2 (SUTURE) ×3 IMPLANT
SYR BULB 3OZ (MISCELLANEOUS) ×3 IMPLANT
SYR CONTROL 10ML LL (SYRINGE) ×3 IMPLANT
TOOTHBRUSH ADULT (PERSONAL CARE ITEMS) IMPLANT
TOWEL GREEN STERILE FF (TOWEL DISPOSABLE) ×3 IMPLANT
TOWEL OR NON WOVEN STRL DISP B (DISPOSABLE) ×3 IMPLANT
TRAY DSU PREP LF (CUSTOM PROCEDURE TRAY) IMPLANT
TUBE CONNECTING 20'X1/4 (TUBING) ×1
TUBE CONNECTING 20X1/4 (TUBING) ×2 IMPLANT
TUBING IRRIGATION (MISCELLANEOUS) ×3 IMPLANT
WATER STERILE IRR 1000ML POUR (IV SOLUTION) ×1 IMPLANT
YANKAUER SUCT BULB TIP NO VENT (SUCTIONS) ×3 IMPLANT

## 2018-03-06 NOTE — Discharge Instructions (Signed)

## 2018-03-06 NOTE — Transfer of Care (Signed)
Immediate Anesthesia Transfer of Care Note  Patient: Scott Mendez  Procedure(s) Performed: EXTRACTION X 1 (N/A Mouth)  Patient Location: PACU  Anesthesia Type:General  Level of Consciousness: awake, sedated and patient cooperative  Airway & Oxygen Therapy: Patient Spontanous Breathing and Patient connected to face mask oxygen  Post-op Assessment: Report given to RN and Post -op Vital signs reviewed and stable  Post vital signs: Reviewed and stable  Last Vitals:  Vitals Value Taken Time  BP 119/62 03/06/2018  2:20 PM  Temp    Pulse 113 03/06/2018  2:20 PM  Resp 23 03/06/2018  2:20 PM  SpO2 95 % 03/06/2018  2:20 PM  Vitals shown include unvalidated device data.  Last Pain:  Vitals:   03/06/18 1152  TempSrc: Oral  PainSc:          Complications: No apparent anesthesia complications

## 2018-03-06 NOTE — Op Note (Signed)
NAME: Nila NephewWASHINGTON, Deacon L. MEDICAL RECORD [id number] ACCOUNT [id number] DATE OF BIRTH:08-Oct-1980 FACILITY: [facility] LOCATION: [facility] PHYSICIAN:Vantasia Pinkney M. Craig Ionescu, DDS  OPERATIVE REPORT  DATE OF PROCEDURE:  03/06/2018  PREOPERATIVE DIAGNOSIS:  Nonrestorable tooth #15 secondary to dental caries, mental disability.  POSTOPERATIVE DIAGNOSIS:  Nonrestorable tooth #15 secondary to dental caries, mental disability.  PROCEDURE:  Extraction of tooth #15.  SURGEON:  Ocie DoyneScott Nikalas Bramel, DDS  ANESTHESIA:  General, oral intubation.  DESCRIPTION OF PROCEDURE:  The patient was taken to the operating room and placed on the table in supine position.  General anesthesia was administered and an oral endotracheal tube was placed and secured.  The eyes were protected and the patient was  draped for surgery.  A timeout was performed.  The posterior pharynx was suctioned and a throat pack was placed, 2% lidocaine 1:100,000 epinephrine was infiltrated buccally and palatally around tooth #15.  Then, a 15 blade was used to make an incision  around the tooth.  Attempt was made to elevate the tooth with a 301 elevator after reflecting the periosteum with a periosteal elevator.  The tooth did not budge.  Then bone was removed with the Stryker handpiece under irrigation around the tooth and the  tooth was sectioned into multiple pieces.  The individual buccal mesial and distal and palatal roots were removed using 301 elevator and rongeurs and the socket was curetted, irrigated and closed with 3-0 chromic.  Then, the oral cavity was irrigated  and suctioned and the throat pack was removed.  The patient was left in the care of anesthesia for awakening, extubation and transport to recovery room with plans for discharge home.  ESTIMATED BLOOD LOSS:  Minimal.  COMPLICATIONS:  None.  SPECIMENS:  None.  TN/NUANCE  D:03/06/2018 T:03/06/2018 JOB:[id number]

## 2018-03-06 NOTE — Anesthesia Preprocedure Evaluation (Signed)
Anesthesia Evaluation  Patient identified by MRN, date of birth, ID band Patient awake    Reviewed: Allergy & Precautions, NPO status , Patient's Chart, lab work & pertinent test results  Airway Mallampati: III  TM Distance: >3 FB Neck ROM: Full    Dental  (+) Dental Advisory Given, Poor Dentition   Pulmonary sleep apnea and Continuous Positive Airway Pressure Ventilation ,    Pulmonary exam normal breath sounds clear to auscultation       Cardiovascular hypertension, Pt. on medications Normal cardiovascular exam Rhythm:Regular Rate:Normal     Neuro/Psych Autism, non-verbalnegative neurological ROS     GI/Hepatic negative GI ROS, Neg liver ROS,   Endo/Other  negative endocrine ROS  Renal/GU negative Renal ROS     Musculoskeletal negative musculoskeletal ROS (+)   Abdominal   Peds  (+) mental retardation Hematology negative hematology ROS (+)   Anesthesia Other Findings Day of surgery medications reviewed with the patient.  Reproductive/Obstetrics                             Anesthesia Physical Anesthesia Plan  ASA: III  Anesthesia Plan: General   Post-op Pain Management:    Induction: Intravenous  PONV Risk Score and Plan: 2 and Dexamethasone and Ondansetron  Airway Management Planned: Oral ETT  Additional Equipment:   Intra-op Plan:   Post-operative Plan: Extubation in OR  Informed Consent: I have reviewed the patients History and Physical, chart, labs and discussed the procedure including the risks, benefits and alternatives for the proposed anesthesia with the patient or authorized representative who has indicated his/her understanding and acceptance.   Dental advisory given  Plan Discussed with: CRNA  Anesthesia Plan Comments:         Anesthesia Quick Evaluation

## 2018-03-06 NOTE — Anesthesia Procedure Notes (Signed)
Procedure Name: Intubation Date/Time: 03/06/2018 1:54 PM Performed by: Lyndee Leo, CRNA Pre-anesthesia Checklist: Patient identified, Emergency Drugs available, Suction available and Patient being monitored Patient Re-evaluated:Patient Re-evaluated prior to induction Oxygen Delivery Method: Circle system utilized Preoxygenation: Pre-oxygenation with 100% oxygen Induction Type: IV induction Ventilation: Mask ventilation without difficulty Laryngoscope Size: Mac and 4 Grade View: Grade II Tube type: Oral Tube size: 8.0 mm Number of attempts: 1 Airway Equipment and Method: Stylet and Oral airway Placement Confirmation: ETT inserted through vocal cords under direct vision,  positive ETCO2 and breath sounds checked- equal and bilateral Secured at: 23 cm Tube secured with: Tape Dental Injury: Teeth and Oropharynx as per pre-operative assessment

## 2018-03-06 NOTE — Op Note (Signed)
03/06/2018  2:12 PM  PATIENT:  Scott Mendez  37 y.o. male  PRE-OPERATIVE DIAGNOSIS:  non restorable tooth #15, mental disability  POST-OPERATIVE DIAGNOSIS:  SAME  PROCEDURE:  Procedure(s): EXTRACTION TOOTH  #15  SURGEON:  Surgeon(s): Ocie DoyneJensen, Kymberlyn Eckford, DDS  ANESTHESIA:   local and general  EBL:  minimal  DRAINS: none   SPECIMEN:  No Specimen  COUNTS:  YES  PLAN OF CARE: Discharge to home after PACU  PATIENT DISPOSITION:  PACU - hemodynamically stable.   PROCEDURE DETAILS: Dictation # [id number]  Georgia LopesScott M. Echo Allsbrook, DMD 03/06/2018 2:12 PM

## 2018-03-06 NOTE — Anesthesia Postprocedure Evaluation (Signed)
Anesthesia Post Note  Patient: Scott Mendez  Procedure(s) Performed: EXTRACTION X 1 (N/A Mouth)     Patient location during evaluation: PACU Anesthesia Type: General Level of consciousness: awake and alert Pain management: pain level controlled Vital Signs Assessment: post-procedure vital signs reviewed and stable Respiratory status: spontaneous breathing, nonlabored ventilation and respiratory function stable Cardiovascular status: blood pressure returned to baseline and stable Postop Assessment: no apparent nausea or vomiting Anesthetic complications: no    Last Vitals:  Vitals:   03/06/18 1444 03/06/18 1520  BP:  118/72  Pulse: (!) 105 (!) 107  Resp: (!) 22 16  Temp:  36.8 C  SpO2: 96% 97%    Last Pain:  Vitals:   03/06/18 1520  TempSrc:   PainSc: 0-No pain                 Scott Mendez

## 2018-03-06 NOTE — H&P (Signed)
H&P documentation  -History and Physical Reviewed  -Patient has been re-examined  -No change in the plan of care  Scott Mendez  

## 2018-03-07 ENCOUNTER — Encounter (HOSPITAL_BASED_OUTPATIENT_CLINIC_OR_DEPARTMENT_OTHER): Payer: Self-pay | Admitting: Oral Surgery

## 2018-03-12 ENCOUNTER — Encounter: Payer: Self-pay | Admitting: Family Medicine

## 2018-03-12 ENCOUNTER — Ambulatory Visit (INDEPENDENT_AMBULATORY_CARE_PROVIDER_SITE_OTHER): Payer: Medicare Other | Admitting: Family Medicine

## 2018-03-12 VITALS — BP 120/80 | HR 96 | Resp 16 | Ht 73.0 in | Wt 357.0 lb

## 2018-03-12 DIAGNOSIS — Z0000 Encounter for general adult medical examination without abnormal findings: Secondary | ICD-10-CM | POA: Diagnosis not present

## 2018-03-12 DIAGNOSIS — I10 Essential (primary) hypertension: Secondary | ICD-10-CM

## 2018-03-12 NOTE — Patient Instructions (Addendum)
Wellness with nurse is due 8/9 or after please schedule at checkout  MD follow up in January , call if you need me sooner  Fasting lipid, chem 7  and EGFR and ( [facility]) CBC soon as possible for medical clearance for dental work to be completed  Please provide name and contact info of the dentist to the Nurse so that clearance can be directed to the Provider in time for your procedure on 04/04/2018

## 2018-03-16 ENCOUNTER — Encounter: Payer: Self-pay | Admitting: Family Medicine

## 2018-03-16 DIAGNOSIS — Z0000 Encounter for general adult medical examination without abnormal findings: Secondary | ICD-10-CM | POA: Insufficient documentation

## 2018-03-16 NOTE — Progress Notes (Signed)
   Scott NephewJefferey L Mendez     MRN: [id number]      DOB: [birth year]   HPI Scott Mendez is here for medical [place] and fill his teeth on 04/04/2018. His recent EKG was reviewed by cardiology for the extraction and shows no abnormality He recently had extraction under [place] , no problem, of note lab done showed elevated sodium so  Am requesting repeat labs ROS History per mother Denies recent fever or chills. Denies sinus pressure, nasal congestion, ear pain or sore throat. Denies chest congestion, productive cough or wheezing. Denies chest pains, palpitations and leg swelling Denies abdominal pain, nausea, vomiting,diarrhea or constipation.   Denies dysuria, frequency, hesitancy or incontinence. Denies joint pain, swelling and does have limitation in mobility.( size) Denies headaches, seizures,  Denies depression, anxiety or insomnia. Denies skin break down or rash.   PE  BP 120/80   Pulse 96   Resp 16   Ht 6\' 1"  (1.854 m)   Wt (!) 357 lb (161.9 kg)   SpO2 96%   BMI 47.10 kg/m   Patient alert and in no cardiopulmonary distress.  HEENT: No facial asymmetry, EOMI,   oropharynx pink and moist.  Neck supple no JVD, no mass.  Chest: Clear to auscultation bilaterally.  CVS: S1, S2 no murmurs, no S3.Regular rate.  ABD: Soft non tender.   Ext: No edema  MS: Adequate ROM spine, shoulders, hips and knees.  Skin: Intact, no ulcerations or rash noted.  CNS: CN 2-12 intact, power,  normal throughout.no focal deficits noted.   Assessment & Plan  Encounter for limited medical examination Medical history reviewed with Mother and medications also Physical exam as documented shows excellent blood pressure control and normal cardiopulmonary exam Medically cleared for dental procedure pending repeat and updated labs,  most recent showed hypernatremia  Essential hypertension Controlled, no change in medication DASH diet and  commitment to daily physical activity for a minimum of 30 minutes discussed and encouraged, as a part of hypertension management. The importance of attaining a healthy weight is also discussed.  BP/Weight 03/12/2018 03/06/2018 01/20/2018 10/14/2017 09/09/2017 04/10/2017 03/13/2017  Systolic BP 120 118 140 149 128 110 112  Diastolic BP 80 72 94 98 80 80 74  Wt. (Lbs) 357 355 357 357 363 354.75 355.04  BMI 47.1 46.84 47.1 47.1 47.89 46.8 46.84  Some encounter information is confidential and restricted. Go to Review Flowsheets activity to see all data.       Morbid obesity [name]. Patient re-educated about  the importance of commitment to a  minimum of 150 minutes of exercise per week.  The importance of healthy food choices with portion control discussed. Encouraged to start a food diary, count calories and to consider  joining a support group. Sample diet sheets offered. Goals set by the patient for the next several months.   Weight /BMI 03/12/2018 03/06/2018 01/20/2018  WEIGHT 357 lb 355 lb 357 lb  HEIGHT 6\' 1"  6\' 1"  6\' 1"   BMI 47.1 kg/m2 46.84 kg/m2 47.1 kg/m2  Some encounter information is confidential and restricted. Go to Review Flowsheets activity to see all data.

## 2018-03-16 NOTE — Assessment & Plan Note (Signed)
[name]. Patient re-educated about  the importance of commitment to a  minimum of 150 minutes of exercise per week.  The importance of healthy food choices with portion control discussed. Encouraged to start a food diary, count calories and to consider  joining a support group. Sample diet sheets offered. Goals set by the patient for the next several months.   Weight /BMI 03/12/2018 03/06/2018 01/20/2018  WEIGHT 357 lb 355 lb 357 lb  HEIGHT 6\' 1"  6\' 1"  6\' 1"   BMI 47.1 kg/m2 46.84 kg/m2 47.1 kg/m2  Some encounter information is confidential and restricted. Go to Review Flowsheets activity to see all data.

## 2018-03-16 NOTE — Assessment & Plan Note (Signed)
Controlled, no change in medication DASH diet and commitment to daily physical activity for a minimum of 30 minutes discussed and encouraged, as a part of hypertension management. The importance of attaining a healthy weight is also discussed.  BP/Weight 03/12/2018 03/06/2018 01/20/2018 10/14/2017 09/09/2017 04/10/2017 03/13/2017  Systolic BP 120 118 140 149 128 110 112  Diastolic BP 80 72 94 98 80 80 74  Wt. (Lbs) 357 355 357 357 363 354.75 355.04  BMI 47.1 46.84 47.1 47.1 47.89 46.8 46.84  Some encounter information is confidential and restricted. Go to Review Flowsheets activity to see all data.

## 2018-03-16 NOTE — Assessment & Plan Note (Signed)
Medical history reviewed with Mother and medications also Physical exam as documented shows excellent blood pressure control and normal cardiopulmonary exam Medically cleared for dental procedure pending repeat and updated labs,  most recent showed hypernatremia

## 2018-03-20 ENCOUNTER — Telehealth: Payer: Self-pay

## 2018-03-20 DIAGNOSIS — I10 Essential (primary) hypertension: Secondary | ICD-10-CM | POA: Diagnosis not present

## 2018-03-20 LAB — BASIC METABOLIC PANEL WITH GFR
BUN: 19 mg/dL (ref 7–25)
CALCIUM: 9.2 mg/dL (ref 8.6–10.3)
CHLORIDE: 103 mmol/L (ref 98–110)
CO2: 28 mmol/L (ref 20–32)
CREATININE: 0.93 mg/dL (ref 0.60–1.35)
GFR, EST AFRICAN AMERICAN: 123 mL/min/{1.73_m2} (ref >=60)
GFR, Est Non African American: 106 mL/min/{1.73_m2} (ref >=60)
GLUCOSE: 84 mg/dL (ref 65–139)
Potassium: 4.2 mmol/L (ref 3.5–5.3)
Sodium: 141 mmol/L (ref 135–146)

## 2018-03-20 LAB — CBC
HEMATOCRIT: 40.4 % (ref 38.5–50.0)
HEMOGLOBIN: 13.7 g/dL (ref 13.2–17.1)
MCH: 29.8 pg (ref 27.0–33.0)
MCHC: 33.9 g/dL (ref 32.0–36.0)
MCV: 88 fL (ref 80.0–100.0)
MPV: 9.9 fL (ref 7.5–12.5)
Platelets: 261 10*3/uL (ref 140–400)
RBC: 4.59 10*6/uL (ref 4.20–5.80)
RDW: 12 % (ref 11.0–15.0)
WBC: 9 10*3/uL (ref 3.8–10.8)

## 2018-03-20 NOTE — Telephone Encounter (Signed)
Labs ordered through [facility]

## 2018-03-28 ENCOUNTER — Ambulatory Visit: Payer: Self-pay

## 2018-04-22 ENCOUNTER — Other Ambulatory Visit: Payer: Self-pay | Admitting: Family Medicine

## 2018-05-05 ENCOUNTER — Other Ambulatory Visit: Payer: Self-pay

## 2018-05-05 ENCOUNTER — Ambulatory Visit (INDEPENDENT_AMBULATORY_CARE_PROVIDER_SITE_OTHER): Payer: Medicare Other | Admitting: Family Medicine

## 2018-05-05 ENCOUNTER — Encounter: Payer: Self-pay | Admitting: Family Medicine

## 2018-05-05 VITALS — BP 124/80 | HR 95 | Temp 98.6°F | Resp 12 | Ht 73.0 in | Wt 359.1 lb

## 2018-05-05 DIAGNOSIS — Z23 Encounter for immunization: Secondary | ICD-10-CM | POA: Diagnosis not present

## 2018-05-05 DIAGNOSIS — Z01818 Encounter for other preprocedural examination: Secondary | ICD-10-CM | POA: Insufficient documentation

## 2018-05-05 NOTE — Assessment & Plan Note (Addendum)
Annual exam as documented. °Changes in health habits are decided on by the patient with goals and time frames  set for achieving them. °Immunization  needs are specifically addressed at this visit. ° °

## 2018-05-05 NOTE — Assessment & Plan Note (Signed)
After obtaining informed consent, the vaccine is  administered by LPN.  

## 2018-05-05 NOTE — Patient Instructions (Signed)
F/U as before, you area [place]!!  Flu vaccine today  All the best  With dental work we will send your clearance over

## 2018-05-05 NOTE — Progress Notes (Signed)
   Scott Mendez     MRN: [id number]      DOB: 1981-07-22   HPI Scott Mendez is here for evacuation for dental clearance again. He has a form which needs to be completed and sent to the responsible dentist. There are no  complaints re Mother's report, patient is auistic  ROS  Per Mother's report, as patient is autistic Denies recent fever or chills. Denies sinus pressure, nasal congestion, ear pain or sore throat. Denies chest congestion, productive cough or wheezing. Denies chest pains, palpitations and leg swelling Denies abdominal pain, nausea, vomiting,diarrhea or constipation.   Denies dysuria, frequency, hesitancy or incontinence. Denies joint pain, swelling and does have imitation in mobility. Denies headaches, seizures, numbness, or tingling. Denies depression, anxiety or insomnia. Denies skin break down or rash.   PE  BP 124/80 (BP Location: Left Arm, Patient Position: Sitting, Cuff Size: Large)   Pulse 95   Temp 98.6 F (37 C) (Oral)   Resp 12   Ht 6\' 1"  (1.854 m)   Wt (!) 359 lb 1.9 oz (162.9 kg)   SpO2 96% Comment: room air  BMI 47.38 kg/m   Patient alert and oriented and in no cardiopulmonary distress.  HEENT: No facial asymmetry, EOMI,   oropharynx pink and moist.  Neck supple no JVD, no mass.  Chest: Clear to auscultation bilaterally.  CVS: S1, S2 no murmurs, no S3.Regular rate.  ABD: Soft non tender.   Ext: No edema  MS: Adequate ROM spine, shoulders, hips and knees.  Skin: Intact, no ulcerations or rash noted.  Psych: Good eye contact,  not anxious or depressed appearing.  CNS: CN 2-12 intact, power,  normal throughout.no focal deficits noted.   Assessment & Plan  Pre-op examination Annual exam as documented. . Changes in health habits are decided on by the patient with goals and time frames  set for achieving them. Immunization  needs are specifically addressed at this visit.   Need for immunization against influenza After  obtaining informed consent, the vaccine is  administered by LPN.

## 2018-05-19 DIAGNOSIS — I10 Essential (primary) hypertension: Secondary | ICD-10-CM | POA: Diagnosis not present

## 2018-05-19 DIAGNOSIS — K029 Dental caries, unspecified: Secondary | ICD-10-CM | POA: Diagnosis not present

## 2018-05-19 DIAGNOSIS — Z7951 Long term (current) use of inhaled steroids: Secondary | ICD-10-CM | POA: Diagnosis not present

## 2018-05-19 DIAGNOSIS — Z79899 Other long term (current) drug therapy: Secondary | ICD-10-CM | POA: Diagnosis not present

## 2018-05-19 DIAGNOSIS — K0500 Acute gingivitis, plaque induced: Secondary | ICD-10-CM | POA: Diagnosis not present

## 2018-05-19 DIAGNOSIS — F840 Autistic disorder: Secondary | ICD-10-CM | POA: Diagnosis not present

## 2018-06-18 ENCOUNTER — Ambulatory Visit: Payer: Self-pay

## 2018-06-19 ENCOUNTER — Ambulatory Visit (INDEPENDENT_AMBULATORY_CARE_PROVIDER_SITE_OTHER): Payer: Medicare Other

## 2018-06-19 VITALS — BP 147/87 | HR 101 | Resp 12 | Ht 73.0 in | Wt 361.0 lb

## 2018-06-19 DIAGNOSIS — Z0000 Encounter for general adult medical examination without abnormal findings: Secondary | ICD-10-CM

## 2018-06-19 NOTE — Patient Instructions (Signed)
Scott Mendez , Thank you for taking time to come for your Medicare Wellness Visit. I appreciate your ongoing commitment to your health goals. Please review the following plan we discussed and let me know if I can assist you in the future.   Screening recommendations/referrals: Colonoscopy: postponed  Recommended yearly ophthalmology/optometry visit for glaucoma screening and checkup Recommended yearly dental visit for hygiene and checkup  Vaccinations: Influenza vaccine: up to date  Pneumococcal vaccine: N/A  Tdap vaccine: up to date  Shingles vaccine: N/A   Advanced directives: In place   Conditions/risks identified: non-verbal mental retardation   Next appointment: Wellness visit in one year   Preventive Care 40-64 Years, Male Preventive care refers to lifestyle choices and visits with your health care provider that can promote health and wellness. What does preventive care include?  A yearly physical exam. This is also called an annual well check.  Dental exams once or twice a year.  Routine eye exams. Ask your health care provider how often you should have your eyes checked.  Personal lifestyle choices, including:  Daily care of your teeth and gums.  Regular physical activity.  Eating a healthy diet.  Avoiding tobacco and drug use.  Limiting alcohol use.  Practicing safe sex.  Taking low-dose aspirin every day starting at age 35. What happens during an annual well check? The services and screenings done by your health care provider during your annual well check will depend on your age, overall health, lifestyle risk factors, and family history of disease. Counseling  Your health care provider may ask you questions about your:  Alcohol use.  Tobacco use.  Drug use.  Emotional well-being.  Home and relationship well-being.  Sexual activity.  Eating habits.  Work and work Astronomerenvironment. Screening  You may have the following tests or  measurements:  Height, weight, and BMI.  Blood pressure.  Lipid and cholesterol levels. These may be checked every 5 years, or more frequently if you are over 35 years old.  Skin check.  Lung cancer screening. You may have this screening every year starting at age 35 if you have a 30-pack-year history of smoking and currently smoke or have quit within the past 15 years.  Fecal occult blood test (FOBT) of the stool. You may have this test every year starting at age 35.  Flexible sigmoidoscopy or colonoscopy. You may have a sigmoidoscopy every 5 years or a colonoscopy every 10 years starting at age 35.  Prostate cancer screening. Recommendations will vary depending on your family history and other risks.  Hepatitis C blood test.  Hepatitis B blood test.  Sexually transmitted disease (STD) testing.  Diabetes screening. This is done by checking your blood sugar (glucose) after you have not eaten for a while (fasting). You may have this done every 1-3 years. Discuss your test results, treatment options, and if necessary, the need for more tests with your health care provider. Vaccines  Your health care provider may recommend certain vaccines, such as:  Influenza vaccine. This is recommended every year.  Tetanus, diphtheria, and acellular pertussis (Tdap, Td) vaccine. You may need a Td booster every 10 years.  Zoster vaccine. You may need this after age 35.  Pneumococcal 13-valent conjugate (PCV13) vaccine. You may need this if you have certain conditions and have not been vaccinated.  Pneumococcal polysaccharide (PPSV23) vaccine. You may need one or two doses if you smoke cigarettes or if you have certain conditions. Talk to your health care provider about which  screenings and vaccines you need and how often you need them. This information is not intended to replace advice given to you by your health care provider. Make sure you discuss any questions you have with your health care  provider. Document Released: 08/19/2015 Document Revised: 04/11/2016 Document Reviewed: 05/24/2015 Elsevier Interactive Patient Education  2017 [employer] Prevention in the Home Falls can cause injuries. They can happen to people of all ages. There are many things you can do to make your home safe and to help prevent falls. What can I do on the outside of my home?  Regularly fix the edges of walkways and driveways and fix any cracks.  Remove anything that might make you trip as you walk through a door, such as a raised step or threshold.  Trim any bushes or trees on the path to your home.  Use bright outdoor lighting.  Clear any walking paths of anything that might make someone trip, such as rocks or tools.  Regularly check to see if handrails are loose or broken. Make sure that both sides of any steps have handrails.  Any raised decks and porches should have guardrails on the edges.  Have any leaves, snow, or ice cleared regularly.  Use sand or salt on walking paths during winter.  Clean up any spills in your garage right away. This includes oil or grease spills. What can I do in the bathroom?  Use night lights.  Install grab bars by the toilet and in the tub and shower. Do not use towel bars as grab bars.  Use non-skid mats or decals in the tub or shower.  If you need to sit down in the shower, use a plastic, non-slip stool.  Keep the floor dry. Clean up any water that spills on the floor as soon as it happens.  Remove soap buildup in the tub or shower regularly.  Attach bath mats securely with double-sided non-slip rug tape.  Do not have throw rugs and other things on the floor that can make you trip. What can I do in the bedroom?  Use night lights.  Make sure that you have a light by your bed that is easy to reach.  Do not use any sheets or blankets that are too big for your bed. They should not hang down onto the floor.  Have a firm chair that has  side arms. You can use this for support while you get dressed.  Do not have throw rugs and other things on the floor that can make you trip. What can I do in the kitchen?  Clean up any spills right away.  Avoid walking on wet floors.  Keep items that you use a lot in easy-to-reach places.  If you need to reach something above you, use a strong step stool that has a grab bar.  Keep electrical cords out of the way.  Do not use floor polish or wax that makes floors slippery. If you must use wax, use non-skid floor wax.  Do not have throw rugs and other things on the floor that can make you trip. What can I do with my stairs?  Do not leave any items on the stairs.  Make sure that there are handrails on both sides of the stairs and use them. Fix handrails that are broken or loose. Make sure that handrails are as long as the stairways.  Check any carpeting to make sure that it is firmly attached to the stairs.  Fix any carpet that is loose or worn.  Avoid having throw rugs at the top or bottom of the stairs. If you do have throw rugs, attach them to the floor with carpet tape.  Make sure that you have a light switch at the top of the stairs and the bottom of the stairs. If you do not have them, ask someone to add them for you. What else can I do to help prevent falls?  Wear shoes that:  Do not have high heels.  Have rubber bottoms.  Are comfortable and fit you well.  Are closed at the toe. Do not wear sandals.  If you use a stepladder:  Make sure that it is fully opened. Do not climb a closed stepladder.  Make sure that both sides of the stepladder are locked into place.  Ask someone to hold it for you, if possible.  Clearly mark and make sure that you can see:  Any grab bars or handrails.  First and last steps.  Where the edge of each step is.  Use tools that help you move around (mobility aids) if they are needed. These  include:  Canes.  Walkers.  Scooters.  Crutches.  Turn on the lights when you go into a dark area. Replace any light bulbs as soon as they burn out.  Set up your furniture so you have a clear path. Avoid moving your furniture around.  If any of your floors are uneven, fix them.  If there are any pets around you, be aware of where they are.  Review your medicines with your doctor. Some medicines can make you feel dizzy. This can increase your chance of falling. Ask your doctor what other things that you can do to help prevent falls. This information is not intended to replace advice given to you by your health care provider. Make sure you discuss any questions you have with your health care provider. Document Released: 05/19/2009 Document Revised: 12/29/2015 Document Reviewed: 08/27/2014 Elsevier Interactive Patient Education  2017 [employer].

## 2018-06-19 NOTE — Progress Notes (Signed)
Subjective:   Scott Mendez is a 37 y.o. male who presents for Medicare Annual/Subsequent preventive examination.  Review of Systems:   Cardiac Risk Factors include: male gender;hypertension;obesity (BMI >30kg/m2)     Objective:    Vitals: BP (!) 147/87   Pulse (!) 101   Resp 12   Ht 6\' 1"  (1.854 m)   Wt (!) 361 lb (163.7 kg)   SpO2 98%   BMI 47.63 kg/m   Body mass index is 47.63 kg/m.  Advanced Directives 06/19/2018 03/06/2018 02/27/2018 03/13/2017  Does Patient Have a Medical Advance Directive? No No Yes Yes  Type of Advance Directive - Chief of Staff- Healthcare Power of Attorney Healthcare Power of Attorney  Does patient want to make changes to medical advance directive? - No - Patient declined No - Patient declined No - Patient declined  Copy of Healthcare Power of Attorney in Chart? - - - No - copy requested  Would patient like information on creating a medical advance directive? No - Patient declined No - Patient declined - -    Tobacco Social History   Tobacco Use  Smoking Status Never Smoker  Smokeless Tobacco Never Used     Counseling given: Not Answered   Clinical Intake:  Pre-visit preparation completed: Yes  Pain : No/denies pain Pain Score: 0-No pain     BMI - recorded: 47.6 Nutritional Status: BMI > 30  Obese Nutritional Risks: Unintentional weight gain Diabetes: No  How often do you need to have someone help you when you read instructions, pamphlets, or other written materials from your doctor or pharmacy?: 5 - Always What is the last grade level you completed in school?: adult day care, mental retardation currently, graduated from [employer] special classes   Interpreter Needed?: No  Information entered by :: [place] PoliAnita Nelson LPN   Past Medical History:  Diagnosis Date  . Autism   . Hypertension    states under control with meds., has been on med. x 8 yr.  . Morbid obesity with BMI of 45.0-49.9, adult (HCC)   . Nonrestorable tooth  02/2018   x 2  . Nonverbal   . Sleep apnea    mild - no CPAP   Past Surgical History:  Procedure Laterality Date  . CYST EXCISION Right 03/03/2002   mandible  . MULTIPLE TOOTH EXTRACTIONS  03/03/2002   #1, 16, 17, 30, 32  . TOENAIL EXCISION Bilateral    great toe  . TOOTH EXTRACTION Right 09/03/2014   Procedure: EXTRACTION MOLAR LOWER RIGHT;  Surgeon: Georgia LopesScott M Jensen, DDS;  Location: [facility] OR;  Service: Oral Surgery;  Laterality: Right;  . TOOTH EXTRACTION N/A 03/06/2018   Procedure: EXTRACTION X 1;  Surgeon: Ocie DoyneJensen, Scott, DDS;  Location: [facility];  Service: Oral Surgery;  Laterality: N/A;   Family History  Problem Relation Age of Onset  . Hypertension Mother   . Hypertension Father   . Diabetes Father   . Heart disease Maternal Grandfather   . Diabetes Paternal Grandmother   . Seizures Maternal Uncle   . CVA Maternal Grandmother   . Heart disease Paternal Grandfather    Social History   Socioeconomic History  . Marital status: Single    Spouse name: N/A  . Number of children: 0  . Years of education: Not on file  . Highest education level: Not on file  Occupational History  . Not on file  Social Needs  . Financial resource strain: Not hard at all  .  Food insecurity:    Worry: Never true    Inability: Never true  . Transportation needs:    Medical: No    Non-medical: No  Tobacco Use  . Smoking status: Never Smoker  . Smokeless tobacco: Never Used  Substance and Sexual Activity  . Alcohol use: No  . Drug use: No  . Sexual activity: Never  Lifestyle  . Physical activity:    Days per week: 2 days    Minutes per session: 30 min  . Stress: Not at all  Relationships  . Social connections:    Talks on phone: Never    Gets together: Never    Attends religious service: 1 to 4 times per year    Active member of club or organization: No    Attends meetings of clubs or organizations: Never    Relationship status: Never married  Other Topics Concern    . Not on file  Social History Narrative   Lives with Mother and sister     Outpatient Encounter Medications as of 06/19/2018  Medication Sig  . fluticasone (FLONASE) 50 MCG/ACT nasal spray INSTILL 2 SPRAYS INTO EACH NOSTRIL DAILY.  Marland Kitchen loratadine (CLARITIN) 10 MG tablet Take 1 tablet (10 mg total) by mouth daily.  . montelukast (SINGULAIR) 10 MG tablet Take 1 tablet (10 mg total) by mouth at bedtime.  Marland Kitchen olmesartan-hydrochlorothiazide (BENICAR HCT) 20-12.5 MG tablet TAKE ONE TABLET BY MOUTH ONCE DAILY.  Marland Kitchen potassium chloride (K-DUR) 10 MEQ tablet Take 20 mEq by mouth daily.  . traZODone (DESYREL) 50 MG tablet Take 0.5-1 tablets (25-50 mg total) by mouth at bedtime as needed. for sleep   No facility-administered encounter medications on file as of 06/19/2018.     Activities of Daily Living In your present state of health, do you have any difficulty performing the following activities: 06/19/2018 03/06/2018  Hearing? N N  Vision? N N  Difficulty concentrating or making decisions? N Y  Walking or climbing stairs? N N  Dressing or bathing? Y Y  Doing errands, shopping? Y -  Quarry manager and eating ? Y -  Using the Toilet? N -  In the past six months, have you accidently leaked urine? N -  Do you have problems with loss of bowel control? N -  Managing your Medications? Y -  Managing your Finances? Y -  Housekeeping or managing your Housekeeping? Y -  Some recent data might be hidden    Patient Care Team: Kerri Perches, MD as PCP - General Mike Craze, MD (Inactive) as Consulting Physician (Psychiatry)   Assessment:   This is a routine wellness examination for Scott Mendez.  Exercise Activities and Dietary recommendations Current Exercise Habits: Home exercise routine, Type of exercise: walking, Time (Minutes): 20, Frequency (Times/Week): 2, Weekly Exercise (Minutes/Week): 40, Intensity: Mild, Exercise limited by: neurologic condition(s)  Goals    . Exercise 3x per week (30  min per time)     Recommend starting a routine exercise program at least 3 days a week for 30-45 minutes at a time as tolerated.      . Weight (lb) < 200 lb (90.7 kg)       Fall Risk Fall Risk  06/19/2018 05/05/2018 03/13/2017 04/04/2016 04/04/2015  Falls in the past year? 0 No No No No   Is the patient's home free of loose throw rugs in walkways, pet beds, electrical cords, etc?   no      Grab bars in the bathroom? yes  Handrails on the stairs?   yes      Adequate lighting?   yes  Timed Get Up and Go Performed: Patient can perform within 6 seconds with no assistance   Depression Screen PHQ 2/9 Scores 06/19/2018 05/05/2018 03/12/2018 01/20/2018  PHQ - 2 Score - 0 0 0  PHQ- 9 Score - 0 0 -  Exception Documentation Patient refusal - - -    Cognitive Function MMSE - Mini Mental State Exam 03/13/2017  Not completed: Unable to complete        Immunization History  Administered Date(s) Administered  . H1N1 06/03/2008  . Influenza Split 04/22/2012  . Influenza Whole 05/07/2007, 05/02/2009, 04/17/2010  . Influenza,inj,Quad PF,6+ Mos 05/18/2013, 07/20/2014, 04/04/2015, 04/04/2016, 04/10/2017, 05/05/2018  . Td 11/23/2002  . Tdap 12/19/2011    Qualifies for Shingles Vaccine? Postponed   Screening Tests Health Maintenance  Topic Date Due  . TETANUS/TDAP  12/18/2021  . INFLUENZA VACCINE  Completed  . HIV Screening  Completed   Cancer Screenings: Lung: Low Dose CT Chest recommended if Age 64-80 years, 30 pack-year currently smoking OR have quit w/in 15years. Patient does not qualify. Colorectal: postponed   Additional Screenings:  Hepatitis C Screening: N/A       Plan:   Continue to increase physical activity, and lose weight   I have personally reviewed and noted the following in the patient's chart:   . Medical and social history . Use of alcohol, tobacco or illicit drugs  . Current medications and supplements . Functional ability and status . Nutritional  status . Physical activity . Advanced directives . List of other physicians . Hospitalizations, surgeries, and ER visits in previous 12 months . Vitals . Screenings to include cognitive, depression, and falls . Referrals and appointments  In addition, I have reviewed and discussed with patient certain preventive protocols, quality metrics, and best practice recommendations. A written personalized care plan for preventive services as well as general preventive health recommendations were provided to patient.     Fabian November, LPN  [id number]

## 2018-07-07 ENCOUNTER — Other Ambulatory Visit: Payer: Self-pay

## 2018-07-07 ENCOUNTER — Encounter (HOSPITAL_COMMUNITY): Payer: Self-pay | Admitting: Emergency Medicine

## 2018-07-07 ENCOUNTER — Telehealth: Payer: Self-pay | Admitting: *Deleted

## 2018-07-07 ENCOUNTER — Emergency Department (HOSPITAL_COMMUNITY)
Admission: EM | Admit: 2018-07-07 | Discharge: 2018-07-07 | Disposition: A | Discharge status: Home / Self Care | Payer: Medicare Other | Attending: Emergency Medicine | Admitting: Emergency Medicine

## 2018-07-07 ENCOUNTER — Emergency Department (HOSPITAL_COMMUNITY): Payer: Medicare Other

## 2018-07-07 DIAGNOSIS — R2241 Localized swelling, mass and lump, right lower limb: Secondary | ICD-10-CM | POA: Diagnosis not present

## 2018-07-07 DIAGNOSIS — Z79899 Other long term (current) drug therapy: Secondary | ICD-10-CM | POA: Insufficient documentation

## 2018-07-07 DIAGNOSIS — R600 Localized edema: Secondary | ICD-10-CM | POA: Diagnosis not present

## 2018-07-07 DIAGNOSIS — M79661 Pain in right lower leg: Secondary | ICD-10-CM | POA: Insufficient documentation

## 2018-07-07 DIAGNOSIS — M79604 Pain in right leg: Secondary | ICD-10-CM

## 2018-07-07 DIAGNOSIS — I10 Essential (primary) hypertension: Secondary | ICD-10-CM | POA: Diagnosis not present

## 2018-07-07 DIAGNOSIS — F840 Autistic disorder: Secondary | ICD-10-CM | POA: Insufficient documentation

## 2018-07-07 LAB — COMPREHENSIVE METABOLIC PANEL
ALT: 28 U/L (ref 0–44)
ANION GAP: 8 (ref 5–15)
AST: 24 U/L (ref 15–41)
Albumin: 4.4 g/dL (ref 3.5–5.0)
Alkaline Phosphatase: 85 U/L (ref 38–126)
BUN: 17 mg/dL (ref 6–20)
CO2: 27 mmol/L (ref 22–32)
Calcium: 9.2 mg/dL (ref 8.9–10.3)
Chloride: 103 mmol/L (ref 98–111)
Creatinine, Ser: 0.86 mg/dL (ref 0.61–1.24)
GFR calc Af Amer: >60 mL/min (ref >60)
GFR calc non Af Amer: >60 mL/min (ref >60)
Glucose, Bld: 103 mg/dL — ABNORMAL HIGH (ref 70–99)
POTASSIUM: 3.5 mmol/L (ref 3.5–5.1)
Sodium: 138 mmol/L (ref 135–145)
Total Bilirubin: 1 mg/dL (ref 0.3–1.2)
Total Protein: 7.6 g/dL (ref 6.5–8.1)

## 2018-07-07 LAB — CBC WITH DIFFERENTIAL/PLATELET
Abs Immature Granulocytes: 0.03 10*3/uL (ref 0.00–0.07)
BASOS ABS: 0.1 10*3/uL (ref 0.0–0.1)
Basophils Relative: 1 %
EOS PCT: 3 %
Eosinophils Absolute: 0.2 10*3/uL (ref 0.0–0.5)
HCT: 45.4 % (ref 39.0–52.0)
Hemoglobin: 14.3 g/dL (ref 13.0–17.0)
Immature Granulocytes: 0 %
Lymphocytes Relative: 19 %
Lymphs Abs: 1.7 10*3/uL (ref 0.7–4.0)
MCH: 29.6 pg (ref 26.0–34.0)
MCHC: 31.5 g/dL (ref 30.0–36.0)
MCV: 94 fL (ref 80.0–100.0)
Monocytes Absolute: 0.6 10*3/uL (ref 0.1–1.0)
Monocytes Relative: 7 %
Neutro Abs: 6.2 10*3/uL (ref 1.7–7.7)
Neutrophils Relative %: 70 %
Platelets: 265 10*3/uL (ref 150–400)
RBC: 4.83 MIL/uL (ref 4.22–5.81)
RDW: 12.1 % (ref 11.5–15.5)
WBC: 8.8 10*3/uL (ref 4.0–10.5)
nRBC: 0 % (ref 0.0–0.2)

## 2018-07-07 MED ORDER — CLINDAMYCIN HCL 300 MG PO CAPS: 300.0000 mg | ORAL_CAPSULE | Freq: Four times a day (QID) | ORAL | 0 refills | Status: DC

## 2018-07-07 MED ORDER — CLINDAMYCIN HCL 150 MG PO CAPS
300.0000 mg | ORAL_CAPSULE | Freq: Once | ORAL | Status: AC
  Administered 2018-07-07: 300 mg via ORAL
  Filled 2018-07-07: qty 2

## 2018-07-07 NOTE — Telephone Encounter (Signed)
I recommend  Ed eval for unilateral leg swelling if it involves leg/ calf to ensure no clot in the leg

## 2018-07-07 NOTE — ED Triage Notes (Signed)
Right leg swelling x 5 days

## 2018-07-07 NOTE — ED Notes (Signed)
Family at bedside. 

## 2018-07-07 NOTE — Telephone Encounter (Signed)
Called both numbers ands left messages

## 2018-07-07 NOTE — Telephone Encounter (Signed)
Please advise 

## 2018-07-07 NOTE — ED Provider Notes (Signed)
[facility] EMERGENCY DEPARTMENT Provider Note   CSN: [id number] Arrival date & time: 07/07/18  1610     History   Chief Complaint Chief Complaint  Patient presents with  . Leg Pain    HPI Geovanie L Greenawalt is a 37 y.o. male.  The history is provided by a parent. The history is limited by a developmental delay.  Leg Pain   This is a new problem. The current episode started more than 2 days ago. The problem occurs constantly. The problem has been gradually improving. The pain is present in the right lower leg. He has tried nothing for the symptoms. There has been no history of extremity trauma.    Past Medical History:  Diagnosis Date  . Autism   . Hypertension    states under control with meds., has been on med. x 8 yr.  . Morbid obesity with BMI of 45.0-49.9, adult (HCC)   . Nonrestorable tooth 02/2018   x 2  . Nonverbal   . Sleep apnea    mild - no CPAP    Patient Active Problem List   Diagnosis Date Noted  . Pre-op examination 05/05/2018  . Pain due to dental caries 01/21/2018  . Nocturia more than twice per night 10/14/2017  . 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 Snoring 09/09/2017  . Insomnia 12/01/2014  . Need for immunization against influenza 07/20/2014  . Autism 12/22/2012  . IMPAIRED GLUCOSE TOLERANCE 08/17/2010  . Impaired fasting glucose 08/17/2010  . Seasonal allergies 11/18/2009  . Essential hypertension 10/11/2009  . Morbid obesity (HCC) 10/31/2007  . MENTAL RETARDATION 10/31/2007    Past Surgical History:  Procedure Laterality Date  . CYST EXCISION Right 03/03/2002   mandible  . MULTIPLE TOOTH EXTRACTIONS  03/03/2002   #1, 16, 17, 30, 32  . TOENAIL EXCISION Bilateral    great toe  . TOOTH EXTRACTION Right 09/03/2014   Procedure: EXTRACTION MOLAR LOWER RIGHT;  Surgeon: Georgia LopesScott M Jensen, DDS;  Location: [facility] OR;  Service: Oral Surgery;  Laterality: Right;  .  TOOTH EXTRACTION N/A 03/06/2018   Procedure: EXTRACTION X 1;  Surgeon: Ocie DoyneJensen, Scott, DDS;  Location: [facility];  Service: Oral Surgery;  Laterality: N/A;        Home Medications    Prior to Admission medications   Medication Sig Start Date End Date Taking? Authorizing Provider  fluticasone (FLONASE) 50 MCG/ACT nasal spray INSTILL 2 SPRAYS INTO EACH NOSTRIL DAILY. Patient taking differently: Place 2 sprays into both nostrils daily as needed for allergies or rhinitis.  01/20/18  Yes Kerri PerchesSimpson, Margaret E, MD  loratadine (CLARITIN) 10 MG tablet Take 1 tablet (10 mg total) by mouth daily. 01/20/18  Yes Kerri PerchesSimpson, Margaret E, MD  montelukast (SINGULAIR) 10 MG tablet Take 1 tablet (10 mg total) by mouth at bedtime. 01/20/18  Yes Kerri PerchesSimpson, Margaret E, MD  olmesartan-hydrochlorothiazide (BENICAR HCT) 20-12.5 MG tablet TAKE ONE TABLET BY MOUTH ONCE DAILY. Patient taking differently: Take 1 tablet by mouth daily.  04/23/18  Yes Kerri PerchesSimpson, Margaret E, MD  potassium chloride (K-DUR) 10 MEQ tablet Take 20 mEq by mouth daily.   Yes [provider]  traZODone (DESYREL) 50 MG tablet Take 0.5-1 tablets (25-50 mg total) by mouth at bedtime as needed. for sleep Patient taking differently: Take 50 mg by mouth at bedtime. for sleep 01/20/18  Yes Kerri PerchesSimpson, Margaret E, MD  clindamycin (CLEOCIN) 300 MG capsule Take 1 capsule (  300 mg total) by mouth 4 (four) times daily. X 7 days 07/07/18   Sydnie Sigmund, Barbara Cower, MD    Family History Family History  Problem Relation Age of Onset  . Hypertension Mother   . Hypertension Father   . Diabetes Father   . Heart disease Maternal Grandfather   . Diabetes Paternal Grandmother   . Seizures Maternal Uncle   . CVA Maternal Grandmother   . Heart disease Paternal Grandfather     Social History Social History   Tobacco Use  . Smoking status: Never Smoker  . Smokeless tobacco: Never Used  Substance Use Topics  . Alcohol use: No  . Drug use: No     Allergies    Patient has no known allergies.   Review of Systems Review of Systems  Cardiovascular: Positive for leg swelling (RLE).  Skin: Positive for color change and rash.  All other systems reviewed and are negative.    Physical Exam Updated Vital Signs BP 132/70 (BP Location: Right Arm)   Pulse 97   Temp 97.8 F (36.6 C)   Resp 20   Ht 5\' 9"  (1.753 m)   Wt (!) 163.3 kg   SpO2 97%   BMI 53.16 kg/m   Physical Exam  Constitutional: He is oriented to person, place, and time. He appears well-developed and well-nourished.  HENT:  Head: Normocephalic and atraumatic.  Eyes: Conjunctivae and EOM are normal.  Neck: Normal range of motion.  Cardiovascular: Normal rate.  Pulmonary/Chest: Effort normal. No respiratory distress.  Abdominal: Soft. Bowel sounds are normal. He exhibits no distension.  Musculoskeletal: Normal range of motion. He exhibits no edema or deformity.  Neurological: He is alert and oriented to person, place, and time.  Skin: Skin is warm and dry.  Nursing note and vitals reviewed.    ED Treatments / Results  Labs (all labs ordered are listed, but only abnormal results are displayed) Labs Reviewed  COMPREHENSIVE METABOLIC PANEL - Abnormal; Notable for the following components:      Result Value   Glucose, Bld 103 (*)    All other components within normal limits  CBC WITH DIFFERENTIAL/PLATELET    EKG None  Radiology US Venous Img Lower Unilateral Right  Result Date: 07/07/2018 CLINICAL DATA:  37 year old with right lower extremity swelling. EXAM: RIGHT LOWER EXTREMITY VENOUS DOPPLER ULTRASOUND TECHNIQUE: Gray-scale sonography with graded compression, as well as color Doppler and duplex ultrasound were performed to evaluate the lower extremity deep venous systems from the level of the common femoral vein and including the common femoral, femoral, profunda femoral, popliteal and calf veins including the posterior tibial, peroneal and gastrocnemius veins when  visible. The superficial great saphenous vein was also interrogated. Spectral Doppler was utilized to evaluate flow at rest and with distal augmentation maneuvers in the common femoral, femoral and popliteal veins. COMPARISON:  None. FINDINGS: Contralateral Common Femoral Vein: Respiratory phasicity is normal and symmetric with the symptomatic side. No evidence of thrombus. Normal compressibility. Common Femoral Vein: No evidence of thrombus. Normal compressibility, respiratory phasicity and response to augmentation. Saphenofemoral Junction: No evidence of thrombus. Normal compressibility and flow on color Doppler imaging. Profunda Femoral Vein: No evidence of thrombus. Normal compressibility and flow on color Doppler imaging. Femoral Vein: No evidence of thrombus. Normal compressibility, respiratory phasicity and response to augmentation. Popliteal Vein: No evidence of thrombus. Normal compressibility, respiratory phasicity and response to augmentation. Calf Veins: Limited evaluation of the deep calf veins due to extensive subcutaneous edema. Superficial Great Saphenous Vein: No  evidence of thrombus. Normal compressibility. Venous Reflux:  None. Other Findings:  None. IMPRESSION: Negative for deep venous thrombosis in right lower extremity. However, limited evaluation of the deep calf veins due to body habitus and edema. Electronically Signed   By: Richarda Overlie M.D.   On: 07/07/2018 17:30    Procedures Procedures (including critical care time)  Medications Ordered in ED Medications  clindamycin (CLEOCIN) capsule 300 mg (300 mg Oral Given 07/07/18 2107)     Initial Impression / Assessment and Plan / ED Course  I have reviewed the triage vital signs and the nursing notes.  Pertinent labs & imaging results that were available during my care of the patient were reviewed by me and considered in my medical decision making (see chart for details).    Red, swollen and painful to touch and [place] negative for DVT  so will check labs and treat for infection with pcp follow up.  Final Clinical Impressions(s) / ED Diagnoses   Final diagnoses:  Right leg pain    ED Discharge Orders         Ordered    clindamycin (CLEOCIN) 300 MG capsule  4 times daily     07/07/18 2052           Nehal Shives, Barbara Cower, MD 07/07/18 2216

## 2018-07-07 NOTE — Telephone Encounter (Signed)
Pts guardian states that his right foot and leg is swollen. Has been swollen since last Wednesday. Wanted to be advised on what she should do about the swelling.

## 2018-07-08 NOTE — Telephone Encounter (Signed)
Called and spoke with patient's Mother Burna MortimerWanda, and made sure that she got my messages. She stated that she did and that she took Scott Mendez to the hospital and he was diagnosed with cellulitis and given antibiotics

## 2018-07-15 ENCOUNTER — Encounter: Payer: Self-pay | Admitting: Family Medicine

## 2018-07-15 ENCOUNTER — Ambulatory Visit (INDEPENDENT_AMBULATORY_CARE_PROVIDER_SITE_OTHER): Payer: Medicare Other | Admitting: Family Medicine

## 2018-07-15 VITALS — BP 128/84 | HR 104 | Resp 17 | Ht 73.0 in | Wt 362.0 lb

## 2018-07-15 DIAGNOSIS — M7989 Other specified soft tissue disorders: Secondary | ICD-10-CM | POA: Diagnosis not present

## 2018-07-15 DIAGNOSIS — I10 Essential (primary) hypertension: Secondary | ICD-10-CM

## 2018-07-15 DIAGNOSIS — J302 Other seasonal allergic rhinitis: Secondary | ICD-10-CM

## 2018-07-15 NOTE — Patient Instructions (Signed)
F/U as before , call if you need me sooner  You are [place] furhter evaluate your new right leg swelling  Complete your antibiotic course

## 2018-07-15 NOTE — Progress Notes (Signed)
   Scott Mendez     MRN: [id number]      DOB: 1981-05-03   HPI Scott Mendez is here for follow up and re-evaluation of right leg swelling which started 2 weeks ago. Had negative  DVT eval in the ED last week Mom stats redness is better, swelling is not, he is completing  antibiotic course , no fever.   ROS See HPI Mother is the historian, pt is autistic Denies recent fever or chills. Denies sinus pressure, nasal congestion, ear pain or sore throat. Denies chest congestion, productive cough or wheezing. Denies chest pains, palpitations PND or orthopneaDenies abdominal pain, nausea, vomiting,diarrhea or constipation.   Denies dysuria, frequency, hesitancy or incontinence. Denies joint pain, swelling and limitation in mobility. Denies headaches, seizures, numbness, or tingling. Denies depression, anxiety or insomnia. Denies skin break down or rash.   PE  BP 128/84   Pulse (!) 104   Resp 17   Ht 6\' 1"  (1.854 m)   Wt (!) 362 lb (164.2 kg)   SpO2 97%   BMI 47.76 kg/m   Patient alert and oriented and in no cardiopulmonary distress.  HEENT: No facial asymmetry, EOMI,   oropharynx pink and moist.  Neck supple no JVD, no mass.  Chest: Clear to auscultation bilaterally.  CVS: S1, S2 no murmurs, no S3.Regular rate.  ABD: Soft non tender.   WUJ:WJXBJExt:Edema of RLE , mild erythema no calf tenderness, negative Homann's  MS: Adequate ROM spine, shoulders, hips and knees.  Skin: Intact, no ulcerations or rash noted. CNS:  power,  normal throughout.no focal deficits noted.   Assessment & Plan  Right leg swelling 2 week h/o right calf swelling unclear etiology, refer to vascular  Morbid obesity [name]. Patient re-educated about  the importance of commitment to a  minimum of 150 minutes of exercise per week.  The importance of healthy food choices with portion control discussed. Encouraged to start a food diary, count calories and to consider  joining a support  group. Sample diet sheets offered. Goals set by the patient for the next several months.   Weight /BMI 07/15/2018 07/07/2018 06/19/2018  WEIGHT 362 lb 360 lb 361 lb  HEIGHT 6\' 1"  5\' 9"  6\' 1"   BMI 47.76 kg/m2 53.16 kg/m2 47.63 kg/m2  Some encounter information is confidential and restricted. Go to Review Flowsheets activity to see all data.      Essential hypertension Controlled, no change in medication DASH diet and commitment to daily physical activity for a minimum of 30 minutes discussed and encouraged, as a part of hypertension management. The importance of attaining a healthy weight is also discussed.  BP/Weight 07/15/2018 07/07/2018 06/19/2018 05/05/2018 03/12/2018 03/06/2018 01/20/2018  Systolic BP 128 132 147 124 120 118 140  Diastolic BP 84 70 87 80 80 72 94  Wt. (Lbs) 362 360 361 359.12 357 355 357  BMI 47.76 53.16 47.63 47.38 47.1 46.84 47.1  Some encounter information is confidential and restricted. Go to Review Flowsheets activity to see all data.       Seasonal allergies Controlled, no change in medication

## 2018-07-15 NOTE — Assessment & Plan Note (Signed)
2 week h/o right calf swelling unclear etiology, refer to vascular

## 2018-07-16 ENCOUNTER — Other Ambulatory Visit: Payer: Self-pay | Admitting: Family Medicine

## 2018-07-25 ENCOUNTER — Encounter: Payer: Self-pay | Admitting: Family Medicine

## 2018-07-25 NOTE — Assessment & Plan Note (Signed)
Controlled, no change in medication  

## 2018-07-25 NOTE — Assessment & Plan Note (Signed)
[name]. Patient re-educated about  the importance of commitment to a  minimum of 150 minutes of exercise per week.  The importance of healthy food choices with portion control discussed. Encouraged to start a food diary, count calories and to consider  joining a support group. Sample diet sheets offered. Goals set by the patient for the next several months.   Weight /BMI 07/15/2018 07/07/2018 06/19/2018  WEIGHT 362 lb 360 lb 361 lb  HEIGHT 6\' 1"  5\' 9"  6\' 1"   BMI 47.76 kg/m2 53.16 kg/m2 47.63 kg/m2  Some encounter information is confidential and restricted. Go to Review Flowsheets activity to see all data.

## 2018-07-25 NOTE — Assessment & Plan Note (Signed)
Controlled, no change in medication DASH diet and commitment to daily physical activity for a minimum of 30 minutes discussed and encouraged, as a part of hypertension management. The importance of attaining a healthy weight is also discussed.  BP/Weight 07/15/2018 07/07/2018 06/19/2018 05/05/2018 03/12/2018 03/06/2018 01/20/2018  Systolic BP 128 132 147 124 120 118 140  Diastolic BP 84 70 87 80 80 72 94  Wt. (Lbs) 362 360 361 359.12 357 355 357  BMI 47.76 53.16 47.63 47.38 47.1 46.84 47.1  Some encounter information is confidential and restricted. Go to Review Flowsheets activity to see all data.

## 2018-08-15 ENCOUNTER — Encounter: Payer: Self-pay | Admitting: *Deleted

## 2018-08-18 ENCOUNTER — Other Ambulatory Visit: Payer: Self-pay | Admitting: Family Medicine

## 2018-08-19 ENCOUNTER — Ambulatory Visit (INDEPENDENT_AMBULATORY_CARE_PROVIDER_SITE_OTHER): Payer: Medicare Other | Admitting: Family Medicine

## 2018-08-19 ENCOUNTER — Encounter: Payer: Self-pay | Admitting: Family Medicine

## 2018-08-19 VITALS — BP 130/84 | HR 90 | Resp 15 | Ht 73.0 in | Wt 361.0 lb

## 2018-08-19 DIAGNOSIS — Z13220 Encounter for screening for lipoid disorders: Secondary | ICD-10-CM

## 2018-08-19 DIAGNOSIS — M7989 Other specified soft tissue disorders: Secondary | ICD-10-CM

## 2018-08-19 DIAGNOSIS — L03115 Cellulitis of right lower limb: Secondary | ICD-10-CM | POA: Diagnosis not present

## 2018-08-19 DIAGNOSIS — I10 Essential (primary) hypertension: Secondary | ICD-10-CM

## 2018-08-19 MED ORDER — OLMESARTAN MEDOXOMIL-HCTZ 20-12.5 MG PO TABS: 1.0000 | ORAL_TABLET | Freq: Every day | ORAL | 6 refills | Status: DC

## 2018-08-19 MED ORDER — FUROSEMIDE 20 MG PO TABS: 20.0000 mg | ORAL_TABLET | Freq: Every day | ORAL | 0 refills | Status: DC

## 2018-08-19 MED ORDER — MONTELUKAST SODIUM 10 MG PO TABS: 10.0000 mg | ORAL_TABLET | Freq: Every day | ORAL | 5 refills | Status: DC

## 2018-08-19 MED ORDER — CEPHALEXIN 500 MG PO CAPS: 500.0000 mg | ORAL_CAPSULE | Freq: Two times a day (BID) | ORAL | 0 refills | Status: DC

## 2018-08-19 MED ORDER — POTASSIUM CHLORIDE ER 10 MEQ PO TBCR: 10.0000 meq | EXTENDED_RELEASE_TABLET | Freq: Two times a day (BID) | ORAL | 0 refills | Status: DC

## 2018-08-19 NOTE — Patient Instructions (Addendum)
F/u in 4.5 months, call if you need me sooner  Three medications are prescribed for right leg swelling and redness, take as prescribed and elevate leg  Keep appt with Specialist, ED if worsens  Fasting lipid, cmp and EGFR and TSH 1 week before next visti

## 2018-08-22 ENCOUNTER — Encounter: Payer: Self-pay | Admitting: Family Medicine

## 2018-08-22 NOTE — Assessment & Plan Note (Signed)
Antibiotic course prescribed 

## 2018-08-22 NOTE — Assessment & Plan Note (Signed)
Unchanged. Patient re-educated about  the importance of commitment to a  minimum of 150 minutes of exercise per week.  The importance of healthy food choices with portion control discussed.  Goals set by the patient for the next several months.   Weight /BMI 08/19/2018 07/15/2018 07/07/2018  WEIGHT 361 lb 362 lb 360 lb  HEIGHT 6\' 1"  6\' 1"  5\' 9"   BMI 47.63 kg/m2 47.76 kg/m2 53.16 kg/m2  Some encounter information is confidential and restricted. Go to Review Flowsheets activity to see all data.

## 2018-08-22 NOTE — Progress Notes (Signed)
   [name]     MRN: [id number]      DOB: Jan 18, 1981   HPI [name] is here for follow up and re-evaluation of chronic medical conditions, most specifically persistent new onset right leg swelling, unfortunately this persists, and his vascular eval is still outstanding. Mother denies any limitation in mobility or excessive pain noted by [name]. He has no new shortness of breath or cough, she feels that a lot of his swelling is because he keeps his leg hanging down even when he is asleep   ROS History from Mother, pt is autistic Denies recent fever or chills. Denies sinus pressure, nasal congestion, ear pain or sore throat. Denies chest congestion, productive cough or wheezing. Denies chest pains, palpitations  Denies abdominal pain, nausea, vomiting,diarrhea or constipation.   Denies dysuria, frequency, hesitancy or incontinence. Denies joint pain, swelling and limitation in mobility. Denies headaches, seizures, numbness, or tingling. Denies depression, anxiety or insomnia. C/o increased redness of RLE PE  BP 130/84   Pulse 90   Resp 15   Ht 6\' 1"  (1.854 m)   Wt (!) 361 lb (163.7 kg)   SpO2 96%   BMI 47.63 kg/m   Patient alert and oriented and in no cardiopulmonary distress.  HEENT: No facial asymmetry, EOMI,   oropharynx pink and moist.  Neck supple no JVD, no mass.  Chest: Clear to auscultation bilaterally.  CVS: S1, S2 no murmurs, no S3.Regular rate.  ABD: Soft non tender.   Ext: right lower extremity edema, no calf tenderness, negative homann's MS: Adequate ROM spine, shoulders, hips and knees.  Skin: Intact, erythema and mild warmth of right leg noted  Psych: Good eye contact, autitic CNS: CN 2-12 intact, power,  normal throughout.no focal deficits noted.   Assessment & Plan  Cellulitis of right leg Antibiotic course prescribed  Right leg swelling Unchanged, start daly diuretic, and encouraged to elevate leg  Morbid  obesity Unchanged. Patient re-educated about  the importance of commitment to a  minimum of 150 minutes of exercise per week.  The importance of healthy food choices with portion control discussed.  Goals set by the patient for the next several months.   Weight /BMI 08/19/2018 07/15/2018 07/07/2018  WEIGHT 361 lb 362 lb 360 lb  HEIGHT 6\' 1"  6\' 1"  5\' 9"   BMI 47.63 kg/m2 47.76 kg/m2 53.16 kg/m2  Some encounter information is confidential and restricted. Go to Review Flowsheets activity to see all data.

## 2018-08-22 NOTE — Assessment & Plan Note (Signed)
Unchanged, start daly diuretic, and encouraged to elevate leg

## 2018-09-10 ENCOUNTER — Other Ambulatory Visit: Payer: Self-pay

## 2018-09-10 DIAGNOSIS — M7989 Other specified soft tissue disorders: Secondary | ICD-10-CM

## 2018-09-16 ENCOUNTER — Encounter (HOSPITAL_COMMUNITY): Payer: Self-pay

## 2018-09-16 ENCOUNTER — Encounter: Payer: Self-pay | Admitting: Vascular Surgery

## 2018-09-16 ENCOUNTER — Other Ambulatory Visit: Payer: Self-pay

## 2018-09-16 ENCOUNTER — Ambulatory Visit (HOSPITAL_COMMUNITY)
Admission: RE | Admit: 2018-09-16 | Discharge: 2018-09-16 | Disposition: A | Discharge status: Home / Self Care | Payer: Medicare Other | Source: Ambulatory Visit | Attending: Vascular Surgery | Admitting: Vascular Surgery

## 2018-09-16 ENCOUNTER — Ambulatory Visit (INDEPENDENT_AMBULATORY_CARE_PROVIDER_SITE_OTHER): Payer: Medicare Other | Admitting: Vascular Surgery

## 2018-09-16 VITALS — BP 132/92 | HR 90 | Resp 18 | Ht 73.0 in | Wt 361.0 lb

## 2018-09-16 DIAGNOSIS — M7989 Other specified soft tissue disorders: Secondary | ICD-10-CM | POA: Diagnosis not present

## 2018-09-16 NOTE — Progress Notes (Signed)
[facility]  Patient name: Scott Mendez MRN: [id number] DOB: 11-26-1980 Sex: male  REASON FOR CONSULT: Valuation of right leg swelling  HPI: Scott Mendez is a 38 y.o. male, who is here today for evaluation of right leg swelling.  He is here with his mother.  He has severe mental retardation.  She reports that this is been swollen for some time and has been progressive.  He does not appear to be in any pain associated with this.  He does not have any history of skin breakdown.  He is morbidly obese with a BMI of 47.  He does appear to have some swelling in his left leg as well although not as significant as his right.  No history of DVT  Past Medical History:  Diagnosis Date  . Autism   . Hypertension    states under control with meds., has been on med. x 8 yr.  . Morbid obesity with BMI of 45.0-49.9, adult (HCC)   . Nonrestorable tooth 02/2018   x 2  . Nonverbal   . Sleep apnea    mild - no CPAP    Family History  Problem Relation Age of Onset  . Hypertension Mother   . Hypertension Father   . Diabetes Father   . Heart disease Maternal Grandfather   . Diabetes Paternal Grandmother   . Seizures Maternal Uncle   . CVA Maternal Grandmother   . Heart disease Paternal Grandfather     SOCIAL HISTORY: Social History   Socioeconomic History  . Marital status: Single    Spouse name: N/A  . Number of children: 0  . Years of education: Not on file  . Highest education level: Not on file  Occupational History  . Not on file  Social Needs  . Financial resource strain: Not hard at all  . Food insecurity:    Worry: Never true    Inability: Never true  . Transportation needs:    Medical: No    Non-medical: No  Tobacco Use  . Smoking status: Never Smoker  . Smokeless tobacco: Never Used  Substance and Sexual Activity  . Alcohol use: No  . Drug use: No  . Sexual activity: Never  Lifestyle    . Physical activity:    Days per week: 2 days    Minutes per session: 30 min  . Stress: Not at all  Relationships  . Social connections:    Talks on phone: Never    Gets together: Never    Attends religious service: 1 to 4 times per year    Active member of club or organization: No    Attends meetings of clubs or organizations: Never    Relationship status: Never married  . Intimate partner violence:    Fear of current or ex partner: No    Emotionally abused: No    Physically abused: No    Forced sexual activity: No  Other Topics Concern  . Not on file  Social History Narrative   Lives with Mother and sister     No Known Allergies  Current Outpatient Medications  Medication Sig Dispense Refill  . fluticasone (FLONASE) 50 MCG/ACT nasal spray INSTILL 2 SPRAYS INTO EACH NOSTRIL DAILY. (Patient taking differently: Place 2 sprays into both nostrils daily as needed for allergies or rhinitis. ) 48 g 1  . loratadine (CLARITIN) 10 MG tablet TAKE ONE TABLET BY MOUTH ONCE DAILY. 90 tablet 0  . montelukast (  SINGULAIR) 10 MG tablet Take 1 tablet (10 mg total) by mouth at bedtime. 30 tablet 5  . olmesartan-hydrochlorothiazide (BENICAR HCT) 20-12.5 MG tablet Take 1 tablet by mouth daily. 30 tablet 6  . potassium chloride SA (K-DUR,KLOR-CON) 20 MEQ tablet TAKE (1) TABLET BY MOUTH ONCE DAILY. 30 tablet 5  . traZODone (DESYREL) 50 MG tablet TAKE 1/2 TO 1 TABLET BY MOUTH AT BEDTIME AS NEEDED FOR SLEEP. 90 tablet 0  . cephALEXin (KEFLEX) 500 MG capsule Take 1 capsule (500 mg total) by mouth 2 (two) times daily. (Patient not taking: Reported on 09/16/2018) 14 capsule 0  . clindamycin (CLEOCIN) 300 MG capsule Take 1 capsule (300 mg total) by mouth 4 (four) times daily. X 7 days (Patient not taking: Reported on 09/16/2018) 28 capsule 0  . furosemide (LASIX) 20 MG tablet Take 1 tablet (20 mg total) by mouth daily. (Patient not taking: Reported on 09/16/2018) 15 tablet 0  . potassium chloride (K-DUR) 10  MEQ tablet Take 1 tablet (10 mEq total) by mouth 2 (two) times daily. (Patient not taking: Reported on 09/16/2018) 30 tablet 0   No current facility-administered medications for this visit.     REVIEW OF SYSTEMS:  [X]  denotes positive finding, [ ]  denotes negative finding Cardiac  Comments:  Chest pain or chest pressure:    Shortness of breath upon exertion:    Short of breath when lying flat:    Irregular heart rhythm:        Vascular    Pain in calf, thigh, or hip brought on by ambulation:    Pain in feet at night that wakes you up from your sleep:     Blood clot in your veins:    Leg swelling:  x       Pulmonary    Oxygen at home:    Productive cough:     Wheezing:         Neurologic    Sudden weakness in arms or legs:     Sudden numbness in arms or legs:     Sudden onset of difficulty speaking or slurred speech:    Temporary loss of vision in one eye:     Problems with dizziness:         Gastrointestinal    Blood in stool:     Vomited blood:         Genitourinary    Burning when urinating:     Blood in urine:        Psychiatric    Major depression:         Hematologic    Bleeding problems:    Problems with blood clotting too easily:        Skin    Rashes or ulcers:        Constitutional    Fever or chills:      PHYSICAL EXAM: Vitals:   09/16/18 1353  BP: (!) 132/92  Pulse: 90  Resp: 18  SpO2: 98%  Weight: (!) 361 lb (163.7 kg)  Height: 6\' 1"  (1.854 m)    GENERAL: The patient is a well-nourished male, in no acute distress. The vital signs are documented above. CARDIOVASCULAR: Palpable radial pulses bilaterally.  I did not palpate pedal pulses due to marked swelling in his feet.  He does have more swelling in his right calf and extending onto his foot than on his left. PULMONARY: There is good air exchange  ABDOMEN: Soft and non-tender  MUSCULOSKELETAL: There are no major  deformities or cyanosis. NEUROLOGIC: No focal weakness or paresthesias are  detected. SKIN: There are no ulcers or rashes noted.  Does have thickening on the medial and lateral aspect of his right leg above his lateral and medial malleolus.  Some scaling of skin but no skin breakdown PSYCHIATRIC: The patient has a normal affect.  DATA:  He had had an outlying hospital venous DVT study which is negative.  He underwent a reflux study in our office today which showed no enlargement in his saphenous vein.  Difficult to determine reflux due to his ability and ability to participate with the study although no gross reflux noted in the surface vein  MEDICAL ISSUES: Clinical picture looks more like lymphedema than chronic venous insufficiency.  I suspect that there is a component of both.  Will be very difficult to manage due to the patient's mental status.  I did explain to the mother the importance of keeping his leg elevated as much as possible which will be difficult for him.  Also explained the importance of compression.  I doubt that he would be able to wear a compression garment.  We have fitted him today with a 6 inch Ace wrap beginning on his foot and extending up to his calf.  We have instructed his mother on how to apply this on a daily basis.  He will see [place] again on an as-needed basis   Larina Earthly, MD [facility] Tel [phone] Pager [phone]

## 2018-09-18 ENCOUNTER — Other Ambulatory Visit: Payer: Self-pay | Admitting: Family Medicine

## 2018-10-08 ENCOUNTER — Ambulatory Visit (INDEPENDENT_AMBULATORY_CARE_PROVIDER_SITE_OTHER): Payer: Medicare Other | Admitting: Family Medicine

## 2018-10-08 ENCOUNTER — Encounter: Payer: Self-pay | Admitting: Family Medicine

## 2018-10-08 VITALS — BP 122/86 | HR 65 | Resp 16 | Ht 73.0 in | Wt 355.0 lb

## 2018-10-08 DIAGNOSIS — I10 Essential (primary) hypertension: Secondary | ICD-10-CM

## 2018-10-08 DIAGNOSIS — M7989 Other specified soft tissue disorders: Secondary | ICD-10-CM | POA: Diagnosis not present

## 2018-10-08 DIAGNOSIS — L210 Seborrhea capitis: Secondary | ICD-10-CM

## 2018-10-08 DIAGNOSIS — L03115 Cellulitis of right lower limb: Secondary | ICD-10-CM | POA: Diagnosis not present

## 2018-10-08 MED ORDER — FUROSEMIDE 20 MG PO TABS: 20.0000 mg | ORAL_TABLET | Freq: Every day | ORAL | 5 refills | Status: DC

## 2018-10-08 MED ORDER — SULFAMETHOXAZOLE-TRIMETHOPRIM 800-160 MG PO TABS: 1.0000 | ORAL_TABLET | Freq: Two times a day (BID) | ORAL | 0 refills | Status: DC

## 2018-10-08 MED ORDER — POTASSIUM CHLORIDE CRYS ER 20 MEQ PO TBCR: 20.0000 meq | EXTENDED_RELEASE_TABLET | Freq: Two times a day (BID) | ORAL | 5 refills | Status: DC

## 2018-10-08 MED ORDER — SELENIUM SULFIDE 1 % EX LOTN: TOPICAL_LOTION | CUTANEOUS | 3 refills | Status: DC

## 2018-10-08 NOTE — Progress Notes (Signed)
Scott Mendez     MRN: [id number]      DOB: 07-05-1981   HPI  Patient autistic, mother provides history Mr. Misener is here for follow up and re-evaluation of chronic medical conditions, medication management and review of any available recent lab and radiology data.  The PT denies any adverse reactions to current medications since the last visit.  1 week h/o open sore on right leg, now 2.5 cm diameter, clear drainage, skin is red in the area C/o flare up of dandruff which has responded in the past to medication Also feels that lasix helped with leg swelling and wants to continue this Vascular surgeon has evaluated pt, no new medication or support hose recommended ROS Denies recent fever or chills. Denies sinus pressure, nasal congestion, ear pain or sore throat. Denies chest congestion, productive cough or wheezing. Denies chest pains, palpitations and leg swelling Denies abdominal pain, nausea, vomiting,diarrhea or constipation.   Denies dysuria, frequency, hesitancy or incontinence. Denies joint pain, swelling and limitation in mobility. Denies headaches, seizures, numbness, or tingling. Denies depression, anxiety or insomnia.    PE  BP 122/86   Pulse 65   Resp 16   Ht 6\' 1"  (1.854 m)   Wt (!) 355 lb (161 kg)   SpO2 96%   BMI 46.84 kg/m   Patient alert and oriented and in no cardiopulmonary distress.  HEENT: No facial asymmetry, EOMI,   oropharynx pink and moist.  Neck supple no JVD, no mass.  Chest: Clear to auscultation bilaterally.  CVS: S1, S2 no murmurs, no S3.Regular rate.  ABD: Soft non tender.   Ext: One plus pitting  Edema of right leg  MS: Adequate ROM spine, shoulders, hips and knees.  Skin:open ulcer lateral aspect of right leg diameter approx 3 cm with erythema of surrounding skin Scaly rask / flaking of scalp  Psych: Good eye contact, normal affect. Memory intact not anxious or depressed appearing.  CNS: CN 2-12 intact, power,   normal throughout.no focal deficits noted.   Assessment & Plan  Cellulitis of right leg Septra prescribed, mother advised to keep skin clean and dry  Right leg swelling Continue daily lasix and potassium supplement with leg elevation  Morbid obesity Improved  Patient re-educated about  the importance of commitment to a  minimum of 150 minutes of exercise per week as able.  The importance of healthy food choices with portion control discussed, as well as eating regularly and within a 12 hour window most days. The need to choose "clean , green" food 50 to 75% of the time is discussed, as well as to make water the primary drink and set a goal of 64 ounces water daily.  Encouraged to start a food diary,  and to consider  joining a support group. Sample diet sheets offered. Goals set by the patient for the next several months.   Weight /BMI 10/08/2018 09/16/2018 08/19/2018  WEIGHT 355 lb 361 lb 361 lb  HEIGHT 6\' 1"  6\' 1"  6\' 1"   BMI 46.84 kg/m2 47.63 kg/m2 47.63 kg/m2  Some encounter information is confidential and restricted. Go to Review Flowsheets activity to see all data.      Essential hypertension Controlled, no change in medication DASH diet and commitment to daily physical activity for a minimum of 30 minutes discussed and encouraged, as a part of hypertension management. The importance of attaining a healthy weight is also discussed.  BP/Weight 10/08/2018 09/16/2018 08/19/2018 07/15/2018 07/07/2018 06/19/2018 05/05/2018  Systolic BP 122  132 130 128 132 147 124  Diastolic BP 86 92 84 84 70 87 80  Wt. (Lbs) 355 361 361 362 360 361 359.12  BMI 46.84 47.63 47.63 47.76 53.16 47.63 47.38  Some encounter information is confidential and restricted. Go to Review Flowsheets activity to see all data.       Dandruff in adult Antifungal shampoo prescribed for regular use, when re eval , may need topical steroid also

## 2018-10-08 NOTE — Patient Instructions (Signed)
F/U in 3 to 4 weeks, call if you need me sooner  Antibiotic is prescribed for leg ulcer and mild cellulitis  Shampoo is prescribed for flaking  Fluid pill and potassium are prescribed for leg swelling  PLEASE, DO NOT SCRATCH YOUR LEG

## 2018-10-11 ENCOUNTER — Encounter: Payer: Self-pay | Admitting: Family Medicine

## 2018-10-11 DIAGNOSIS — L210 Seborrhea capitis: Secondary | ICD-10-CM | POA: Insufficient documentation

## 2018-10-11 NOTE — Assessment & Plan Note (Signed)
Antifungal shampoo prescribed for regular use, when re eval , may need topical steroid also

## 2018-10-11 NOTE — Assessment & Plan Note (Signed)
Controlled, no change in medication DASH diet and commitment to daily physical activity for a minimum of 30 minutes discussed and encouraged, as a part of hypertension management. The importance of attaining a healthy weight is also discussed.  BP/Weight 10/08/2018 09/16/2018 08/19/2018 07/15/2018 07/07/2018 06/19/2018 05/05/2018  Systolic BP 122 132 130 128 132 147 124  Diastolic BP 86 92 84 84 70 87 80  Wt. (Lbs) 355 361 361 362 360 361 359.12  BMI 46.84 47.63 47.63 47.76 53.16 47.63 47.38  Some encounter information is confidential and restricted. Go to Review Flowsheets activity to see all data.

## 2018-10-11 NOTE — Assessment & Plan Note (Signed)
Improved  Patient re-educated about  the importance of commitment to a  minimum of 150 minutes of exercise per week as able.  The importance of healthy food choices with portion control discussed, as well as eating regularly and within a 12 hour window most days. The need to choose "clean , green" food 50 to 75% of the time is discussed, as well as to make water the primary drink and set a goal of 64 ounces water daily.  Encouraged to start a food diary,  and to consider  joining a support group. Sample diet sheets offered. Goals set by the patient for the next several months.   Weight /BMI 10/08/2018 09/16/2018 08/19/2018  WEIGHT 355 lb 361 lb 361 lb  HEIGHT 6\' 1"  6\' 1"  6\' 1"   BMI 46.84 kg/m2 47.63 kg/m2 47.63 kg/m2  Some encounter information is confidential and restricted. Go to Review Flowsheets activity to see all data.

## 2018-10-11 NOTE — Assessment & Plan Note (Signed)
Septra prescribed, mother advised to keep skin clean and dry

## 2018-10-11 NOTE — Assessment & Plan Note (Signed)
Continue daily lasix and potassium supplement with leg elevation

## 2018-10-22 ENCOUNTER — Other Ambulatory Visit: Payer: Self-pay

## 2018-10-22 ENCOUNTER — Encounter: Payer: Self-pay | Admitting: Family Medicine

## 2018-10-22 ENCOUNTER — Ambulatory Visit (INDEPENDENT_AMBULATORY_CARE_PROVIDER_SITE_OTHER): Payer: Medicare Other | Admitting: Family Medicine

## 2018-10-22 VITALS — BP 130/86 | HR 80 | Resp 15 | Ht 73.0 in | Wt 356.0 lb

## 2018-10-22 DIAGNOSIS — M7989 Other specified soft tissue disorders: Secondary | ICD-10-CM | POA: Diagnosis not present

## 2018-10-22 DIAGNOSIS — L853 Xerosis cutis: Secondary | ICD-10-CM | POA: Insufficient documentation

## 2018-10-22 MED ORDER — MUPIROCIN CALCIUM 2 % EX CREA: TOPICAL_CREAM | CUTANEOUS | 1 refills | Status: DC

## 2018-10-22 NOTE — Patient Instructions (Signed)
Keep f/u in May as before, call if you need me sooner  Use pure vaseline twice daily to keep skin as healthy as possible  Keep leg elevated while awake when sitting down ( make it fun)  If skin starts getting red/ inflamed/ war, use the antibiotic ointment prescribed as directed, if not responding, needs OV  Think about what you will eat, plan ahead. Choose " clean, green, fresh or frozen" over canned, processed or packaged foods which are more sugary, salty and fatty. 70 to 75% of food eaten should be vegetables and fruit. Three meals at set times with snacks allowed between meals, but they must be fruit or vegetables. Aim to eat over a 12 hour period , example 7 am to 7 pm, and STOP after  your last meal of the day. Drink water,generally about 64 ounces per day, no other drink is as healthy. Fruit juice is best enjoyed in a healthy way, by EATING the fruit.

## 2018-10-22 NOTE — Progress Notes (Signed)
   Scott Mendez     MRN: [id number]      DOB: 12-06-1980   HPI Scott Mendez is here for follow up and re-evaluation of right leg cellulitis. thje redness has resolved, the skin remains dry and scaly there is no warmth or drainage present. Mother concerned about area on the back of the calf which looks new , however , area of concern just has dryness and scaling Still challenged keeping his feet elevated. No fever or chills  ROS  Per mother Denies recent fever or chills. Denies sinus pressure, nasal congestion, ear pain or sore throat. Denies chest congestion, productive cough or wheezing. Denies chest pains, palpitations and leg swelling Denies abdominal pain, nausea, vomiting,diarrhea or constipation.   Denies dysuria, frequency, hesitancy or incontinence. Chronic  limitation in mobility. Denies headaches, seizures, numbness, or tingling. Denies depression, anxiety or insomnia.  PE  BP 130/86   Pulse 80   Resp 15   Ht 6\' 1"  (1.854 m)   Wt (!) 356 lb (161.5 kg)   SpO2 96%   BMI 46.97 kg/m   Patient alert and oriented and in no cardiopulmonary distress.  HEENT: No facial asymmetry,st.  Neck supple   Chest: Clear to auscultation bilaterally.  CVS: S1, S2 no murmurs, no S3.Regular rate.  .   MCE:YEMVVKP RLE edema, 2 plus, pitting  MS: decreased   Skin: Intact, no skin breakdown noted, no eryhtema , warmth or tenerness of RLE, skin is scly and dry     Assessment & Plan  Dry skin dermatitis Mother to use pure vaseline twice daily and will have bactroban in the event that cellulitis starts developing  Right leg swelling Encouraged to keep leg elevated while awake , Mom to become creative  Morbid obesity [name].  Patient re-educated about  the importance of commitment to a  minimum of 150 minutes of exercise per week as able.  The importance of healthy food choices with portion control discussed, as well as eating regularly and within a 12 hour  window most days. The need to choose "clean , green" food 50 to 75% of the time is discussed, as well as to make water the primary drink and set a goal of 64 ounces water daily.  Encouraged to start a food diary,  and to consider  joining a support group. Sample diet sheets offered. Goals set by the patient for the next several months.   Weight /BMI 10/22/2018 10/08/2018 09/16/2018  WEIGHT 356 lb 355 lb 361 lb  HEIGHT 6\' 1"  6\' 1"  6\' 1"   BMI 46.97 kg/m2 46.84 kg/m2 47.63 kg/m2  Some encounter information is confidential and restricted. Go to Review Flowsheets activity to see all data.

## 2018-10-22 NOTE — Assessment & Plan Note (Signed)
Encouraged to keep leg elevated while awake , Mom to become creative

## 2018-10-22 NOTE — Assessment & Plan Note (Signed)
Mother to use pure vaseline twice daily and will have bactroban in the event that cellulitis starts developing

## 2018-10-22 NOTE — Assessment & Plan Note (Signed)
[name].  Patient re-educated about  the importance of commitment to a  minimum of 150 minutes of exercise per week as able.  The importance of healthy food choices with portion control discussed, as well as eating regularly and within a 12 hour window most days. The need to choose "clean , green" food 50 to 75% of the time is discussed, as well as to make water the primary drink and set a goal of 64 ounces water daily.  Encouraged to start a food diary,  and to consider  joining a support group. Sample diet sheets offered. Goals set by the patient for the next several months.   Weight /BMI 10/22/2018 10/08/2018 09/16/2018  WEIGHT 356 lb 355 lb 361 lb  HEIGHT 6\' 1"  6\' 1"  6\' 1"   BMI 46.97 kg/m2 46.84 kg/m2 47.63 kg/m2  Some encounter information is confidential and restricted. Go to Review Flowsheets activity to see all data.

## 2018-12-11 ENCOUNTER — Encounter: Payer: Self-pay | Admitting: *Deleted

## 2018-12-31 DIAGNOSIS — Z13220 Encounter for screening for lipoid disorders: Secondary | ICD-10-CM | POA: Diagnosis not present

## 2018-12-31 DIAGNOSIS — I10 Essential (primary) hypertension: Secondary | ICD-10-CM | POA: Diagnosis not present

## 2019-01-01 ENCOUNTER — Encounter (INDEPENDENT_AMBULATORY_CARE_PROVIDER_SITE_OTHER): Payer: Self-pay

## 2019-01-01 ENCOUNTER — Ambulatory Visit (INDEPENDENT_AMBULATORY_CARE_PROVIDER_SITE_OTHER): Payer: Medicare Other | Admitting: Family Medicine

## 2019-01-01 ENCOUNTER — Other Ambulatory Visit: Payer: Self-pay

## 2019-01-01 ENCOUNTER — Encounter: Payer: Self-pay | Admitting: Family Medicine

## 2019-01-01 VITALS — BP 130/86 | Ht 73.0 in | Wt 356.0 lb

## 2019-01-01 DIAGNOSIS — I10 Essential (primary) hypertension: Secondary | ICD-10-CM | POA: Diagnosis not present

## 2019-01-01 DIAGNOSIS — R7301 Impaired fasting glucose: Secondary | ICD-10-CM | POA: Diagnosis not present

## 2019-01-01 DIAGNOSIS — M7989 Other specified soft tissue disorders: Secondary | ICD-10-CM | POA: Diagnosis not present

## 2019-01-01 DIAGNOSIS — F5104 Psychophysiologic insomnia: Secondary | ICD-10-CM

## 2019-01-01 LAB — COMPLETE METABOLIC PANEL WITH GFR
AG Ratio: 1.8 (calc) (ref 1.0–2.5)
ALT: 45 U/L (ref 9–46)
AST: 32 U/L (ref 10–40)
Albumin: 4.2 g/dL (ref 3.6–5.1)
Alkaline phosphatase (APISO): 86 U/L (ref 36–130)
BUN: 22 mg/dL (ref 7–25)
CO2: 31 mmol/L (ref 20–32)
Calcium: 9.2 mg/dL (ref 8.6–10.3)
Chloride: 105 mmol/L (ref 98–110)
Creat: 1.01 mg/dL (ref 0.60–1.35)
GFR, EST AFRICAN AMERICAN: 111 mL/min/{1.73_m2} (ref >=60)
GFR, Est Non African American: 96 mL/min/{1.73_m2} (ref >=60)
Globulin: 2.4 g/dL (calc) (ref 1.9–3.7)
Glucose, Bld: 95 mg/dL (ref 65–99)
Potassium: 4.1 mmol/L (ref 3.5–5.3)
Sodium: 142 mmol/L (ref 135–146)
Total Bilirubin: 0.5 mg/dL (ref 0.2–1.2)
Total Protein: 6.6 g/dL (ref 6.1–8.1)

## 2019-01-01 LAB — LIPID PANEL
CHOL/HDL RATIO: 3.7 (calc) (ref <5.0)
Cholesterol: 143 mg/dL (ref <200)
HDL: 39 mg/dL — ABNORMAL LOW (ref >=40)
LDL Cholesterol (Calc): 87 mg/dL (calc)
Non-HDL Cholesterol (Calc): 104 mg/dL (calc) (ref <130)
Triglycerides: 79 mg/dL (ref <150)

## 2019-01-01 LAB — TSH: TSH: 2.59 mIU/L (ref 0.40–4.50)

## 2019-01-01 MED ORDER — TRAZODONE HCL 100 MG PO TABS: 100.0000 mg | ORAL_TABLET | Freq: Every day | ORAL | 3 refills | Status: DC

## 2019-01-01 NOTE — Assessment & Plan Note (Signed)
Persistent however moreso on dorsum of foot, leg elevation, and reduction in salt intake encouraged

## 2019-01-01 NOTE — Assessment & Plan Note (Signed)
Controlled, no change in medication DASH diet and commitment to daily physical activity for a minimum of 30 minutes discussed and encouraged, as a part of hypertension management. The importance of attaining a healthy weight is also discussed.  BP/Weight 01/01/2019 10/22/2018 10/08/2018 09/16/2018 08/19/2018 07/15/2018 07/07/2018  Systolic BP 130 130 122 132 130 128 132  Diastolic BP 86 86 86 92 84 84 70  Wt. (Lbs) 356 356 355 361 361 362 360  BMI 46.97 46.97 46.84 47.63 47.63 47.76 53.16  Some encounter information is confidential and restricted. Go to Review Flowsheets activity to see all data.

## 2019-01-01 NOTE — Patient Instructions (Signed)
F/U in office in 4.5 months, call if you need me sooner  Increase activity to improve good cholesterol, otherwise labs are excellent  Please set a later bedtime to between 8 and 9, and the dose of trazodone is increased to 100 mg  Every evening, you may take TWO 50 mg tablets together to make up this dose until you get your new prescription  Keep feet elevated as much as possible to reduce swelling  Think about what you will eat, plan ahead. Choose " clean, green, fresh or frozen" over canned, processed or packaged foods which are more sugary, salty and fatty. 70 to 75% of food eaten should be vegetables and fruit. Three meals at set times with snacks allowed between meals, but they must be fruit or vegetables. Aim to eat over a 12 hour period , example 7 am to 7 pm, and STOP after  your last meal of the day. Drink water,generally about 64 ounces per day, no other drink is as healthy. Fruit juice is best enjoyed in a healthy way, by EATING the fruit.  Social distancing. Frequent hand washing with soap and water Keeping your hands off of your face. These 3 practices will help to keep both you and your community healthy during this time. Please practice them faithfully!

## 2019-01-01 NOTE — Assessment & Plan Note (Signed)
Patient educated about the importance of limiting  Carbohydrate intake , the need to commit to daily physical activity for a minimum of 30 minutes , and to commit weight loss. The fact that changes in all these areas will reduce or eliminate all together the development of diabetes is stressed.  Updated lab needed at/ before next visit.   Diabetic Labs Latest Ref Rng & Units 12/31/2018 07/07/2018 03/20/2018 03/06/2018 09/09/2017  HbA1c <5.7 % - - - - -  Chol <200 mg/dL 782 - - - -  HDL > OR = 40 mg/dL 95(A) - - - -  Calc LDL mg/dL (calc) 87 - - - -  Triglycerides <150 mg/dL 79 - - - -  Creatinine 0.60 - 1.35 mg/dL 2.13 0.86 5.78 4.69 6.29   BP/Weight 01/01/2019 10/22/2018 10/08/2018 09/16/2018 08/19/2018 07/15/2018 07/07/2018  Systolic BP 130 130 122 132 130 128 132  Diastolic BP 86 86 86 92 84 84 70  Wt. (Lbs) 356 356 355 361 361 362 360  BMI 46.97 46.97 46.84 47.63 47.63 47.76 53.16  Some encounter information is confidential and restricted. Go to Review Flowsheets activity to see all data.   No flowsheet data found.

## 2019-01-01 NOTE — Progress Notes (Signed)
Virtual Visit via Telephone Note  I connected with Scott Mendez on 01/01/19 at  8:20 AM EDT by telephone and verified that I am speaking with the correct person using two identifiers.  Location: Patient: home  Provider: office   I discussed the limitations, risks, security and privacy concerns of performing an evaluation and management service by telephone and the availability of in person appointments. I also discussed with the patient that there may be a patient responsible charge related to this service. The patient expressed understanding and agreed to proceed. This visit type is conducted due to national recommendations for restrictions regarding the COVID -19 Pandemic. Due to the patient's age and / or co morbidities, this format is felt to be most appropriate at this time without adequate follow up. The patient has no access to video technology/ had technical difficulties with video, requiring transitioning to audio format  only ( telephone ). All issues noted this document were discussed and addressed,no physical exam can be performed in this format.   History of Present Illness: History from Mother, pt has autism C/o swelling of dorsum of right foot C/o inadequate sleep, medication seems to have little effect,  Goes to sleep at 7 pm and is up at 2 am eating Denies recent fever or chills. Denies sinus pressure, nasal congestion, ear pain or sore throat. Denies chest congestion, productive cough or wheezing.  Denies abdominal pain, nausea, vomiting,diarrhea or constipation.   Denies dysuria, frequency, incontinence Chronic  limitation in mobility. Denies , seizures,   Denies skin break down or rash.       Observations/Objective: BP 130/86   Ht 6\' 1"  (1.854 m)   Wt (!) 356 lb (161.5 kg)   BMI 46.97 kg/m    Assessment and Plan:  Essential hypertension Controlled, no change in medication DASH diet and commitment to daily physical activity for a minimum of 30  minutes discussed and encouraged, as a part of hypertension management. The importance of attaining a healthy weight is also discussed.  BP/Weight 01/01/2019 10/22/2018 10/08/2018 09/16/2018 08/19/2018 07/15/2018 07/07/2018  Systolic BP 130 130 122 132 130 128 132  Diastolic BP 86 86 86 92 84 84 70  Wt. (Lbs) 356 356 355 361 361 362 360  BMI 46.97 46.97 46.84 47.63 47.63 47.76 53.16  Some encounter information is confidential and restricted. Go to Review Flowsheets activity to see all data.       Morbid obesity   Patient re-educated about  the importance of commitment to a  minimum of 150 minutes of exercise per week as able.  The importance of healthy food choices with portion control discussed, as well as eating regularly and within a 12 hour window most days. The need to choose "clean , green" food 50 to 75% of the time is discussed, as well as to make water the primary drink and set a goal of 64 ounces water daily.  Encouraged to start a food diary,  and to consider  joining a support group. Sample diet sheets offered. Goals set by the patient for the next several months.   Weight /BMI 01/01/2019 10/22/2018 10/08/2018  WEIGHT 356 lb 356 lb 355 lb  HEIGHT 6\' 1"  6\' 1"  6\' 1"   BMI 46.97 kg/m2 46.97 kg/m2 46.84 kg/m2  Some encounter information is confidential and restricted. Go to Review Flowsheets activity to see all data.      Right leg swelling Persistent however moreso on dorsum of foot, leg elevation, and reduction in salt intake  encouraged  Insomnia Inadequate response to medication dose to be increased and Mother to keep pt awake until 8 to 9 and then giv medication at that time  Impaired fasting glucose Patient educated about the importance of limiting  Carbohydrate intake , the need to commit to daily physical activity for a minimum of 30 minutes , and to commit weight loss. The fact that changes in all these areas will reduce or eliminate all together the development of  diabetes is stressed.  Updated lab needed at/ before next visit.   Diabetic Labs Latest Ref Rng & Units 12/31/2018 07/07/2018 03/20/2018 03/06/2018 09/09/2017  HbA1c <5.7 % - - - - -  Chol <200 mg/dL 782143 - - - -  HDL > OR = 40 mg/dL 95(A39(L) - - - -  Calc LDL mg/dL (calc) 87 - - - -  Triglycerides <150 mg/dL 79 - - - -  Creatinine 0.60 - 1.35 mg/dL 2.131.01 0.860.86 5.780.93 4.690.90 6.290.94   BP/Weight 01/01/2019 10/22/2018 10/08/2018 09/16/2018 08/19/2018 07/15/2018 07/07/2018  Systolic BP 130 130 122 132 130 128 132  Diastolic BP 86 86 86 92 84 84 70  Wt. (Lbs) 356 356 355 361 361 362 360  BMI 46.97 46.97 46.84 47.63 47.63 47.76 53.16  Some encounter information is confidential and restricted. Go to Review Flowsheets activity to see all data.   No flowsheet data found.     Follow Up Instructions:    I discussed the assessment and treatment plan with the patient. The patient was provided an opportunity to ask questions and all were answered. The patient agreed with the plan and demonstrated an understanding of the instructions.   The patient was advised to call back or seek an in-person evaluation if the symptoms worsen or if the condition fails to improve as anticipated.  I provided 22 minutes of non-face-to-face time during this encounter.   Syliva OvermanMargaret Charlesetta Milliron, MD

## 2019-01-01 NOTE — Assessment & Plan Note (Signed)
   Patient re-educated about  the importance of commitment to a  minimum of 150 minutes of exercise per week as able.  The importance of healthy food choices with portion control discussed, as well as eating regularly and within a 12 hour window most days. The need to choose "clean , green" food 50 to 75% of the time is discussed, as well as to make water the primary drink and set a goal of 64 ounces water daily.  Encouraged to start a food diary,  and to consider  joining a support group. Sample diet sheets offered. Goals set by the patient for the next several months.   Weight /BMI 01/01/2019 10/22/2018 10/08/2018  WEIGHT 356 lb 356 lb 355 lb  HEIGHT 6\' 1"  6\' 1"  6\' 1"   BMI 46.97 kg/m2 46.97 kg/m2 46.84 kg/m2  Some encounter information is confidential and restricted. Go to Review Flowsheets activity to see all data.

## 2019-01-01 NOTE — Assessment & Plan Note (Signed)
Inadequate response to medication dose to be increased and Mother to keep pt awake until 8 to 9 and then giv medication at that time

## 2019-01-30 ENCOUNTER — Other Ambulatory Visit: Payer: Self-pay

## 2019-01-30 ENCOUNTER — Telehealth: Payer: Self-pay | Admitting: Family Medicine

## 2019-01-30 MED ORDER — FLUTICASONE PROPIONATE 50 MCG/ACT NA SUSP: NASAL | 1 refills | Status: DC

## 2019-01-30 NOTE — Telephone Encounter (Signed)
Please send in Flonase to pharmacy

## 2019-01-30 NOTE — Telephone Encounter (Signed)
flonase sent to pharmacy 

## 2019-03-01 ENCOUNTER — Other Ambulatory Visit: Payer: Self-pay | Admitting: Family Medicine

## 2019-03-28 ENCOUNTER — Other Ambulatory Visit: Payer: Self-pay | Admitting: Family Medicine

## 2019-04-09 ENCOUNTER — Other Ambulatory Visit: Payer: Self-pay | Admitting: Family Medicine

## 2019-04-10 ENCOUNTER — Telehealth: Payer: Self-pay | Admitting: *Deleted

## 2019-04-10 NOTE — Telephone Encounter (Signed)
Scott Mendez pts guardian called to let Dr Moshe Cipro know that Trung was out his torsemide and needed it refilled let her know it would be Tuesday before anyone was back in office

## 2019-04-14 NOTE — Telephone Encounter (Signed)
Medication refilled. (furosemide not torsemide)

## 2019-04-20 ENCOUNTER — Ambulatory Visit (INDEPENDENT_AMBULATORY_CARE_PROVIDER_SITE_OTHER): Payer: Medicare Other

## 2019-04-20 ENCOUNTER — Other Ambulatory Visit: Payer: Self-pay

## 2019-04-20 DIAGNOSIS — Z23 Encounter for immunization: Secondary | ICD-10-CM

## 2019-04-23 ENCOUNTER — Other Ambulatory Visit: Payer: Self-pay | Admitting: Family Medicine

## 2019-04-29 ENCOUNTER — Other Ambulatory Visit: Payer: Self-pay | Admitting: Family Medicine

## 2019-05-14 ENCOUNTER — Other Ambulatory Visit: Payer: Self-pay

## 2019-05-14 ENCOUNTER — Encounter: Payer: Self-pay | Admitting: Family Medicine

## 2019-05-14 ENCOUNTER — Ambulatory Visit (INDEPENDENT_AMBULATORY_CARE_PROVIDER_SITE_OTHER): Payer: Medicare Other | Admitting: Family Medicine

## 2019-05-14 VITALS — BP 132/86 | HR 91 | Temp 98.2°F | Ht 73.0 in | Wt 359.0 lb

## 2019-05-14 DIAGNOSIS — F5104 Psychophysiologic insomnia: Secondary | ICD-10-CM | POA: Diagnosis not present

## 2019-05-14 DIAGNOSIS — J302 Other seasonal allergic rhinitis: Secondary | ICD-10-CM

## 2019-05-14 DIAGNOSIS — I10 Essential (primary) hypertension: Secondary | ICD-10-CM

## 2019-05-14 DIAGNOSIS — L03115 Cellulitis of right lower limb: Secondary | ICD-10-CM

## 2019-05-14 DIAGNOSIS — E559 Vitamin D deficiency, unspecified: Secondary | ICD-10-CM | POA: Diagnosis not present

## 2019-05-14 DIAGNOSIS — R7301 Impaired fasting glucose: Secondary | ICD-10-CM | POA: Diagnosis not present

## 2019-05-14 MED ORDER — MONTELUKAST SODIUM 10 MG PO TABS: 10.0000 mg | ORAL_TABLET | Freq: Every day | ORAL | 5 refills | Status: DC

## 2019-05-14 MED ORDER — TRAZODONE HCL 100 MG PO TABS: 100.0000 mg | ORAL_TABLET | Freq: Every day | ORAL | 5 refills | Status: DC

## 2019-05-14 MED ORDER — CEPHALEXIN 500 MG PO CAPS: 500.0000 mg | ORAL_CAPSULE | Freq: Three times a day (TID) | ORAL | 0 refills | Status: DC

## 2019-05-14 NOTE — Progress Notes (Signed)
   Scott Mendez     MRN: [id number]      DOB: Aug 30, 1980   HPI  History from Mother Scott Mendez is here for follow up and re-evaluation of chronic medical conditions, medication management and review of any available recent lab and radiology data.   denies any adverse reactions to current medications since the last visit.  10 day h/o increased redness and discomfort of right leg ROS Denies recent fever or chills. Denies sinus pressure, nasal congestion, ear pain or sore throat. Denies chest congestion, productive cough or wheezing. Denies chest pains, palpitations and leg swelling Denies abdominal pain, nausea, vomiting,diarrhea or constipation.   Denies dysuria, frequency, hesitancy or incontinence. C/o  limitation in mobility. Denies headaches, seizures, numbness, or tingling. Denies depression, anxiety or insomnia.  PE  BP 132/86   Pulse 91   Temp 98.2 F (36.8 C) (Temporal)   Ht 6\' 1"  (1.854 m)   Wt (!) 359 lb (162.8 kg)   SpO2 97%   BMI 47.36 kg/m   Patient alert  and in no cardiopulmonary distress.Morbidly obese HEENT: No facial asymmetry, EOMI,  Chest: Clear to auscultation bilaterally.  CVS: S1, S2 no murmurs, no S3.Regular rate.  ABD: Soft non tender.   Ext: No edema  MS: Adequate though reduced  ROM spine, shoulders, hips and knees.  Skin: Intact, erythema and warmth of right shin, no breakdown or ulceration  Psych: Good eye contact,  not anxious or depressed appearing.  CNS: CN 2-12 intact, .no focal deficits noted.   Assessment & Plan  Essential hypertension Controlled, no change in medication DASH diet and commitment to daily physical activity for a minimum of 30 minutes discussed and encouraged, as a part of hypertension management. The importance of attaining a healthy weight is also discussed.  BP/Weight 05/14/2019 01/01/2019 10/22/2018 10/08/2018 09/16/2018 08/19/2018 13/03/6577  Systolic BP 469 629 528 413 244 010 272  Diastolic BP 86  86 86 86 92 84 84  Wt. (Lbs) 359 356 356 355 361 361 362  BMI 47.36 46.97 46.97 46.84 47.63 47.63 47.76  Some encounter information is confidential and restricted. Go to Review Flowsheets activity to see all data.       Morbid obesity  Patient re-educated about  the importance of commitment to a  minimum of 150 minutes of exercise per week as able.  The importance of healthy food choices with portion control discussed, as well as eating regularly and within a 12 hour window most days. The need to choose "clean , green" food 50 to 75% of the time is discussed, as well as to make water the primary drink and set a goal of 64 ounces water daily.    Weight /BMI 05/14/2019 01/01/2019 10/22/2018  WEIGHT 359 lb 356 lb 356 lb  HEIGHT 6\' 1"  6\' 1"  6\' 1"   BMI 47.36 kg/m2 46.97 kg/m2 46.97 kg/m2  Some encounter information is confidential and restricted. Go to Review Flowsheets activity to see all data.      Seasonal allergies Adequately controlled , stop flonase as pt uses infrequently and incorrectly  Cellulitis of right leg Antibbiotic , keflex prescribed for short course  Insomnia Managed with trazodone, continue same

## 2019-05-14 NOTE — Patient Instructions (Addendum)
F/U with MD in 6 months, call if you need me sooner  Pleas get CBC, fasting lipid, cmp and EGFr, TSH and HBA1C  and vit Din 5 months and 2 weeks  Keflex is prescribed for 1 week for right leg  Flonase is discontinued  Think about what you will eat, plan ahead. Choose " clean, green, fresh or frozen" over canned, processed or packaged foods which are more sugary, salty and fatty. 70 to 75% of food eaten should be vegetables and fruit. Three meals at set times with snacks allowed between meals, but they must be fruit or vegetables. Aim to eat over a 12 hour period , example 7 am to 7 pm, and STOP after  your last meal of the day. Drink water,generally about 64 ounces per day, no other drink is as healthy. Fruit juice is best enjoyed in a healthy way, by EATING the fruit. It is important that you exercise regularly at least 30 minutes 5 times a week. If you develop chest pain, have severe difficulty breathing, or feel very tired, stop exercising immediately and seek medical attention  Thanks for choosing [facility], we consider it a privelige to serve you.

## 2019-05-16 NOTE — Assessment & Plan Note (Signed)
Managed with trazodone, continue same

## 2019-05-16 NOTE — Assessment & Plan Note (Signed)
Controlled, no change in medication DASH diet and commitment to daily physical activity for a minimum of 30 minutes discussed and encouraged, as a part of hypertension management. The importance of attaining a healthy weight is also discussed.  BP/Weight 05/14/2019 01/01/2019 10/22/2018 10/08/2018 09/16/2018 08/19/2018 16/05/9603  Systolic BP 540 981 191 478 295 621 308  Diastolic BP 86 86 86 86 92 84 84  Wt. (Lbs) 359 356 356 355 361 361 362  BMI 47.36 46.97 46.97 46.84 47.63 47.63 47.76  Some encounter information is confidential and restricted. Go to Review Flowsheets activity to see all data.

## 2019-05-16 NOTE — Assessment & Plan Note (Signed)
  Patient re-educated about  the importance of commitment to a  minimum of 150 minutes of exercise per week as able.  The importance of healthy food choices with portion control discussed, as well as eating regularly and within a 12 hour window most days. The need to choose "clean , green" food 50 to 75% of the time is discussed, as well as to make water the primary drink and set a goal of 64 ounces water daily.    Weight /BMI 05/14/2019 01/01/2019 10/22/2018  WEIGHT 359 lb 356 lb 356 lb  HEIGHT 6\' 1"  6\' 1"  6\' 1"   BMI 47.36 kg/m2 46.97 kg/m2 46.97 kg/m2  Some encounter information is confidential and restricted. Go to Review Flowsheets activity to see all data.

## 2019-05-16 NOTE — Assessment & Plan Note (Signed)
Adequately controlled , stop flonase as pt uses infrequently and incorrectly

## 2019-05-16 NOTE — Assessment & Plan Note (Signed)
Antibbiotic , keflex prescribed for short course

## 2019-06-23 ENCOUNTER — Other Ambulatory Visit: Payer: Self-pay

## 2019-06-23 ENCOUNTER — Ambulatory Visit (INDEPENDENT_AMBULATORY_CARE_PROVIDER_SITE_OTHER): Payer: Medicare Other | Admitting: Family Medicine

## 2019-06-23 ENCOUNTER — Ambulatory Visit: Payer: Self-pay | Admitting: Family Medicine

## 2019-06-23 ENCOUNTER — Encounter: Payer: Self-pay | Admitting: Family Medicine

## 2019-06-23 ENCOUNTER — Encounter: Payer: Medicare Other | Admitting: Family Medicine

## 2019-06-23 VITALS — BP 138/86 | HR 91 | Resp 15 | Ht 73.0 in | Wt 359.0 lb

## 2019-06-23 DIAGNOSIS — Z0000 Encounter for general adult medical examination without abnormal findings: Secondary | ICD-10-CM

## 2019-06-23 NOTE — Progress Notes (Signed)
Subjective:   Scott Mendez is a 38 y.o. male who presents for Medicare Annual/Subsequent preventive examination.  Location of Patient: Home Location of Provider: Telehealth Consent was obtain for visit to be over via telehealth.  I verified that I am speaking with the correct person using two identifiers. Mother is HPOA and Historian   Review of Systems:    Cardiac Risk Factors include: hypertension;obesity (BMI >30kg/m2);male gender     Objective:    Vitals: BP 138/86   Pulse 91   Resp 15   Ht 6\' 1"  (1.854 m)   Wt (!) 359 lb (162.8 kg)   BMI 47.36 kg/m   Body mass index is 47.36 kg/m.  Advanced Directives 07/07/2018 06/19/2018 03/06/2018 02/27/2018 03/13/2017  Does Patient Have a Medical Advance Directive? No No No Yes Yes  Type of Advance Directive - - 05/13/2017  Does patient want to make changes to medical advance directive? - - No - Patient declined No - Patient declined No - Patient declined  Copy of Healthcare Power of Attorney in Chart? - - - - No - copy requested  Would patient like information on creating a medical advance directive? No - Patient declined No - Patient declined No - Patient declined - -    Tobacco Social History   Tobacco Use  Smoking Status Never Smoker  Smokeless Tobacco Never Used     Counseling given: Yes   Clinical Intake:  Pre-visit preparation completed: Yes  Pain : No/denies pain Pain Score: 0-No pain     BMI - recorded: 47.36 Nutritional Status: BMI > 30  Obese Nutritional Risks: None Diabetes: No  How often do you need to have someone help you when you read instructions, pamphlets, or other written materials from your doctor or pharmacy?: 1 - Never What is the last grade level you completed in school?: 12  Interpreter Needed?: No     Past Medical History:  Diagnosis Date  . Autism   . Hypertension    states under control with meds., has been on med. x 8 yr.   . Morbid obesity with BMI of 45.0-49.9, adult (HCC)   . Nonrestorable tooth 02/2018   x 2  . Nonverbal   . Sleep apnea    mild - no CPAP   Past Surgical History:  Procedure Laterality Date  . CYST EXCISION Right 03/03/2002   mandible  . MULTIPLE TOOTH EXTRACTIONS  03/03/2002   #1, 16, 17, 30, 32  . TOENAIL EXCISION Bilateral    great toe  . TOOTH EXTRACTION Right 09/03/2014   Procedure: EXTRACTION MOLAR LOWER RIGHT;  Surgeon: 09/05/2014, DDS;  Location: [facility] OR;  Service: Oral Surgery;  Laterality: Right;  . TOOTH EXTRACTION N/A 03/06/2018   Procedure: EXTRACTION X 1;  Surgeon: 05/06/2018, DDS;  Location: [facility];  Service: Oral Surgery;  Laterality: N/A;   Family History  Problem Relation Age of Onset  . Hypertension Mother   . Hypertension Father   . Diabetes Father   . Heart disease Maternal Grandfather   . Diabetes Paternal Grandmother   . Seizures Maternal Uncle   . CVA Maternal Grandmother   . Heart disease Paternal Grandfather    Social History   Socioeconomic History  . Marital status: Single    Spouse name: N/A  . Number of children: 0  . Years of education: Not on file  . Highest education level: Not on file  Occupational History  . Not on file  Social Needs  . Financial resource strain: Not hard at all  . Food insecurity    Worry: Never true    Inability: Never true  . Transportation needs    Medical: No    Non-medical: No  Tobacco Use  . Smoking status: Never Smoker  . Smokeless tobacco: Never Used  Substance and Sexual Activity  . Alcohol use: No  . Drug use: No  . Sexual activity: Never  Lifestyle  . Physical activity    Days per week: 2 days    Minutes per session: 30 min  . Stress: Not at all  Relationships  . Social Herbalist on phone: Never    Gets together: Never    Attends religious service: 1 to 4 times per year    Active member of club or organization: No    Attends meetings of clubs or  organizations: Never    Relationship status: Never married  Other Topics Concern  . Not on file  Social History Narrative   Lives with Mother and sister     Outpatient Encounter Medications as of 06/23/2019  Medication Sig  . furosemide (LASIX) 20 MG tablet TAKE 1 TABLET BY MOUTH ONCE A DAY.  Marland Kitchen loratadine (CLARITIN) 10 MG tablet TAKE ONE TABLET BY MOUTH ONCE DAILY.  . montelukast (SINGULAIR) 10 MG tablet TAKE ONE TABLET BY MOUTH AT BEDTIME.  . mupirocin cream (BACTROBAN) 2 % Apply two times daily to affected area for 5 days  . olmesartan-hydrochlorothiazide (BENICAR HCT) 20-12.5 MG tablet TAKE ONE TABLET BY MOUTH ONCE DAILY.  Marland Kitchen potassium chloride SA (K-DUR) 20 MEQ tablet TAKE ONE TABLET BY MOUTH TWICE A DAY  . selenium sulfide (SELSUN) 1 % LOTN Wash  Hair daily with 12 cc shampoo for 3 weeks , then twice weekly  . traZODone (DESYREL) 100 MG tablet Take 1 tablet (100 mg total) by mouth at bedtime.  . [DISCONTINUED] cephALEXin (KEFLEX) 500 MG capsule Take 1 capsule (500 mg total) by mouth 3 (three) times daily. (Patient not taking: Reported on 06/23/2019)  . [DISCONTINUED] montelukast (SINGULAIR) 10 MG tablet Take 1 tablet (10 mg total) by mouth at bedtime. (Patient not taking: Reported on 06/23/2019)   No facility-administered encounter medications on file as of 06/23/2019.     Activities of Daily Living In your present state of health, do you have any difficulty performing the following activities: 06/23/2019  Hearing? N  Vision? N  Difficulty concentrating or making decisions? N  Walking or climbing stairs? N  Dressing or bathing? Y  Doing errands, shopping? Y  Preparing Food and eating ? Y  Using the Toilet? Y  In the past six months, have you accidently leaked urine? N  Do you have problems with loss of bowel control? N  Managing your Medications? Y  Managing your Finances? Y  Housekeeping or managing your Housekeeping? Y  Some recent data might be hidden    Patient Care  Team: Fayrene Helper, MD as PCP - General Darrol Jump, MD (Inactive) as Consulting Physician (Psychiatry)   Assessment:   This is a routine wellness examination for Scott Mendez.  Exercise Activities and Dietary recommendations Current Exercise Habits: Home exercise routine, Type of exercise: walking, Time (Minutes): 30, Frequency (Times/Week): 3, Weekly Exercise (Minutes/Week): 90, Intensity: Mild, Exercise limited by: None identified  Goals    . Exercise 3x per week (30 min per time)     Recommend starting a  routine exercise program at least 3 days a week for 30-45 minutes at a time as tolerated.      . Weight (lb) < 200 lb (90.7 kg)       Fall Risk Fall Risk  06/23/2019 05/14/2019 10/22/2018 10/08/2018 08/19/2018  Falls in the past year? 0 0 0 0 0  Number falls in past yr: 0 0 0 0 -  Injury with Fall? 0 0 0 0 -   Is the patient's home free of loose throw rugs in walkways, pet beds, electrical cords, etc?   yes      Grab bars in the bathroom? no      Handrails on the stairs?   yes      Adequate lighting?   yes     Depression Screen PHQ 2/9 Scores 06/23/2019 10/22/2018 07/15/2018 06/19/2018  PHQ - 2 Score 0 1 1 -  PHQ- 9 Score - - 2 -  Exception Documentation - - - Patient refusal    Cognitive Function MMSE - Mini Mental State Exam 03/13/2017  Not completed: Unable to complete        Immunization History  Administered Date(s) Administered  . H1N1 06/03/2008  . Influenza Split 04/22/2012  . Influenza Whole 05/07/2007, 05/02/2009, 04/17/2010  . Influenza,inj,Quad PF,6+ Mos 05/18/2013, 07/20/2014, 04/04/2015, 04/04/2016, 04/10/2017, 05/05/2018, 04/20/2019  . Td 11/23/2002  . Tdap 12/19/2011    Qualifies for Shingles Vaccine?  No due until 50  Screening Tests Health Maintenance  Topic Date Due  . TETANUS/TDAP  12/18/2021  . INFLUENZA VACCINE  Completed  . HIV Screening  Completed   Cancer Screenings: Lung: Low Dose CT Chest recommended if Age 35-80 years, 30  pack-year currently smoking OR have quit w/in 15years. Patient does not qualify. Colorectal:  No due until 50  Additional Screenings:   Hepatitis C Screening: completed      Plan:       1. Encounter for Medicare annual wellness exam   I have personally reviewed and noted the following in the patient's chart:   . Medical and social history . Use of alcohol, tobacco or illicit drugs  . Current medications and supplements . Functional ability and status . Nutritional status . Physical activity . Advanced directives . List of other physicians . Hospitalizations, surgeries, and ER visits in previous 12 months . Vitals . Screenings to include cognitive, depression, and falls . Referrals and appointments  In addition, I have reviewed and discussed with patient certain preventive protocols, quality metrics, and best practice recommendations. A written personalized care plan for preventive services as well as general preventive health recommendations were provided to patient.    I provided 20 minutes of non-face-to-face time during this encounter.   Freddy FinnerHannah M Minard Millirons, NP  06/23/2019

## 2019-06-23 NOTE — Patient Instructions (Signed)
Scott Mendez , Thank you for taking time to come for your Medicare Wellness Visit. I appreciate your ongoing commitment to your health goals. Please review the following plan we discussed and let me know if I can assist you in the future.   Please continue to practice social distancing to keep you, your family, and our community safe.  If you must go out, please wear a Mask and practice good handwashing.  We have that you have a happy safe and healthy holiday season! See you in the new year :)   Screening recommendations/referrals: Colonoscopy: Started age 69-50 Recommended yearly ophthalmology/optometry visit for glaucoma screening and checkup Recommended yearly dental visit for hygiene and checkup  Vaccinations: Influenza vaccine: Up-to-date Pneumococcal vaccine: Has not received yet Tdap vaccine: Up-to-date Shingles vaccine: Due age 64  Advanced directives: Completed  Conditions/risks identified: Fall  Next appointment: 11/12/2019   Preventive Care , Male Preventive care refers to lifestyle choices and visits with your health care provider that can promote health and wellness. What does preventive care include?  A yearly physical exam. This is also called an annual well check.  Dental exams once or twice a year.  Routine eye exams. Ask your health care provider how often you should have your eyes checked.  Personal lifestyle choices, including:  Daily care of your teeth and gums.  Regular physical activity.  Eating a healthy diet.  Avoiding tobacco and drug use.  Limiting alcohol use.  Practicing safe sex.  Taking low-dose aspirin every day starting at age 69. What happens during an annual well check? The services and screenings done by your health care provider during your annual well check will depend on your age, overall health, lifestyle risk factors, and family history of disease. Counseling  Your health care provider may ask you questions about your:   Alcohol use.  Tobacco use.  Drug use.  Emotional well-being.  Home and relationship well-being.  Sexual activity.  Eating habits.  Work and work Statistician. Screening  You may have the following tests or measurements:  Height, weight, and BMI.  Blood pressure.  Lipid and cholesterol levels. These may be checked every 5 years, or more frequently if you are over 13 years old.  Skin check.  Lung cancer screening. You may have this screening every year starting at age 2 if you have a 30-pack-year history of smoking and currently smoke or have quit within the past 15 years.  Fecal occult blood test (FOBT) of the stool. You may have this test every year starting at age 24.  Flexible sigmoidoscopy or colonoscopy. You may have a sigmoidoscopy every 5 years or a colonoscopy every 10 years starting at age 57.  Prostate cancer screening. Recommendations will vary depending on your family history and other risks.  Hepatitis C blood test.  Hepatitis B blood test.  Sexually transmitted disease (STD) testing.  Diabetes screening. This is done by checking your blood sugar (glucose) after you have not eaten for a while (fasting). You may have this done every 1-3 years. Discuss your test results, treatment options, and if necessary, the need for more tests with your health care provider. Vaccines  Your health care provider may recommend certain vaccines, such as:  Influenza vaccine. This is recommended every year.  Tetanus, diphtheria, and acellular pertussis (Tdap, Td) vaccine. You may need a Td booster every 10 years.  Zoster vaccine. You may need this after age 45.  Pneumococcal 13-valent conjugate (PCV13) vaccine. You may need this if  you have certain conditions and have not been vaccinated.  Pneumococcal polysaccharide (PPSV23) vaccine. You may need one or two doses if you smoke cigarettes or if you have certain conditions. Talk to your health care provider about which  screenings and vaccines you need and how often you need them. This information is not intended to replace advice given to you by your health care provider. Make sure you discuss any questions you have with your health care provider. Document Released: 08/19/2015 Document Revised: 04/11/2016 Document Reviewed: 05/24/2015 Elsevier Interactive Patient Education  2017 [employer].  Fall Prevention in the Home Falls can cause injuries. They can happen to people of all ages. There are many things you can do to make your home safe and to help prevent falls. What can I do on the outside of my home?  Regularly fix the edges of walkways and driveways and fix any cracks.  Remove anything that might make you trip as you walk through a door, such as a raised step or threshold.  Trim any bushes or trees on the path to your home.  Use bright outdoor lighting.  Clear any walking paths of anything that might make someone trip, such as rocks or tools.  Regularly check to see if handrails are loose or broken. Make sure that both sides of any steps have handrails.  Any raised decks and porches should have guardrails on the edges.  Have any leaves, snow, or ice cleared regularly.  Use sand or salt on walking paths during winter.  Clean up any spills in your garage right away. This includes oil or grease spills. What can I do in the bathroom?  Use night lights.  Install grab bars by the toilet and in the tub and shower. Do not use towel bars as grab bars.  Use non-skid mats or decals in the tub or shower.  If you need to sit down in the shower, use a plastic, non-slip stool.  Keep the floor dry. Clean up any water that spills on the floor as soon as it happens.  Remove soap buildup in the tub or shower regularly.  Attach bath mats securely with double-sided non-slip rug tape.  Do not have throw rugs and other things on the floor that can make you trip. What can I do in the bedroom?  Use  night lights.  Make sure that you have a light by your bed that is easy to reach.  Do not use any sheets or blankets that are too big for your bed. They should not hang down onto the floor.  Have a firm chair that has side arms. You can use this for support while you get dressed.  Do not have throw rugs and other things on the floor that can make you trip. What can I do in the kitchen?  Clean up any spills right away.  Avoid walking on wet floors.  Keep items that you use a lot in easy-to-reach places.  If you need to reach something above you, use a strong step stool that has a grab bar.  Keep electrical cords out of the way.  Do not use floor polish or wax that makes floors slippery. If you must use wax, use non-skid floor wax.  Do not have throw rugs and other things on the floor that can make you trip. What can I do with my stairs?  Do not leave any items on the stairs.  Make sure that there are handrails on both  sides of the stairs and use them. Fix handrails that are broken or loose. Make sure that handrails are as long as the stairways.  Check any carpeting to make sure that it is firmly attached to the stairs. Fix any carpet that is loose or worn.  Avoid having throw rugs at the top or bottom of the stairs. If you do have throw rugs, attach them to the floor with carpet tape.  Make sure that you have a light switch at the top of the stairs and the bottom of the stairs. If you do not have them, ask someone to add them for you. What else can I do to help prevent falls?  Wear shoes that:  Do not have high heels.  Have rubber bottoms.  Are comfortable and fit you well.  Are closed at the toe. Do not wear sandals.  If you use a stepladder:  Make sure that it is fully opened. Do not climb a closed stepladder.  Make sure that both sides of the stepladder are locked into place.  Ask someone to hold it for you, if possible.  Clearly mark and make sure that you  can see:  Any grab bars or handrails.  First and last steps.  Where the edge of each step is.  Use tools that help you move around (mobility aids) if they are needed. These include:  Canes.  Walkers.  Scooters.  Crutches.  Turn on the lights when you go into a dark area. Replace any light bulbs as soon as they burn out.  Set up your furniture so you have a clear path. Avoid moving your furniture around.  If any of your floors are uneven, fix them.  If there are any pets around you, be aware of where they are.  Review your medicines with your doctor. Some medicines can make you feel dizzy. This can increase your chance of falling. Ask your doctor what other things that you can do to help prevent falls. This information is not intended to replace advice given to you by your health care provider. Make sure you discuss any questions you have with your health care provider. Document Released: 05/19/2009 Document Revised: 12/29/2015 Document Reviewed: 08/27/2014 Elsevier Interactive Patient Education  2017 [employer].

## 2019-06-24 ENCOUNTER — Ambulatory Visit: Payer: Self-pay

## 2019-07-13 ENCOUNTER — Ambulatory Visit: Payer: Self-pay

## 2019-07-18 IMAGING — US RIGHT LOWER EXTREMITY VENOUS ULTRASOUND
1 series · 13 of 24 positions shown · non-contrast
Comparison: None.

CLINICAL DATA: 37-year-old with right lower extremity swelling.



[Series 1: right lower extremity venous ultrasound · 0.11mm/px · 13 of 43 slices shown]
[im 1/43]
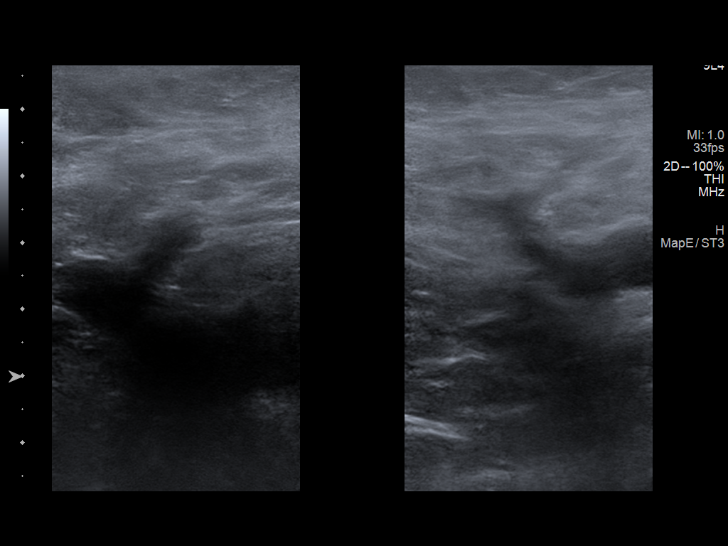
[im 4/43]
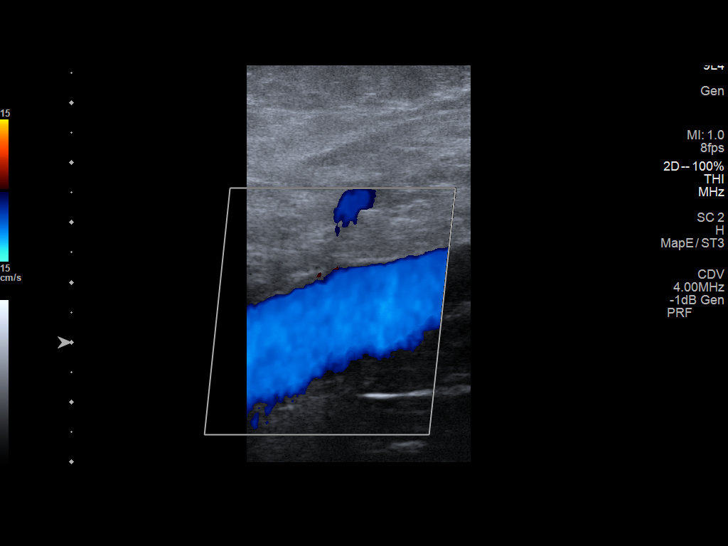
[im 8/43]
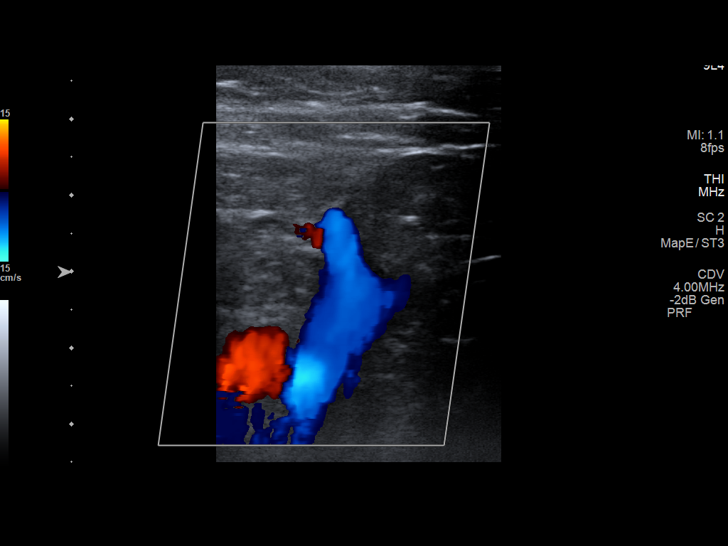
[im 11/43]
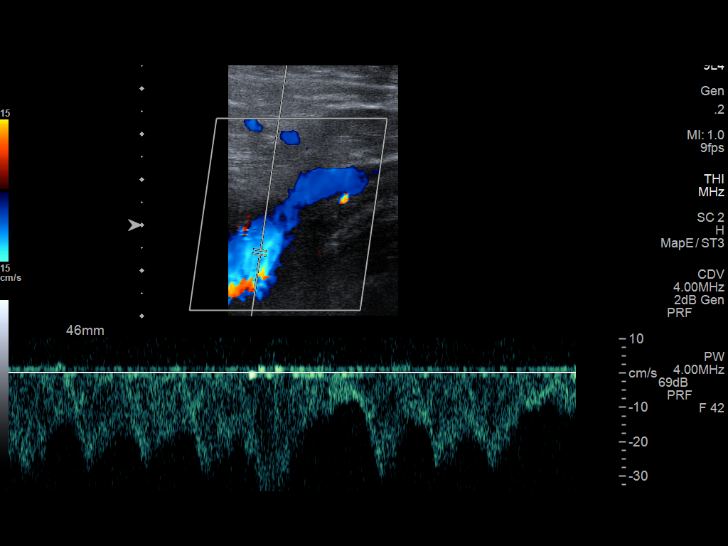
[im 15/43]
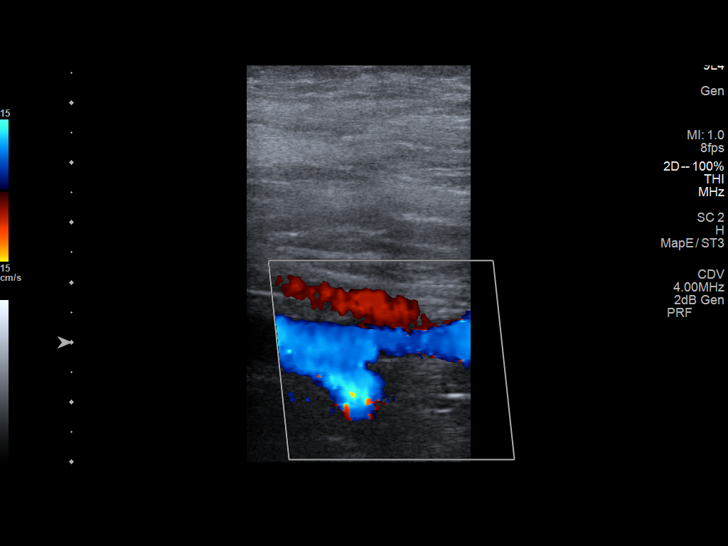
[im 19/43]
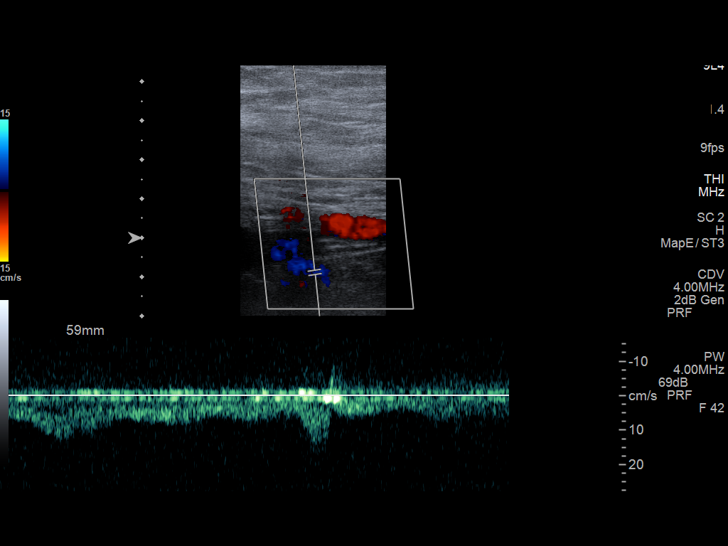
[im 22/43]
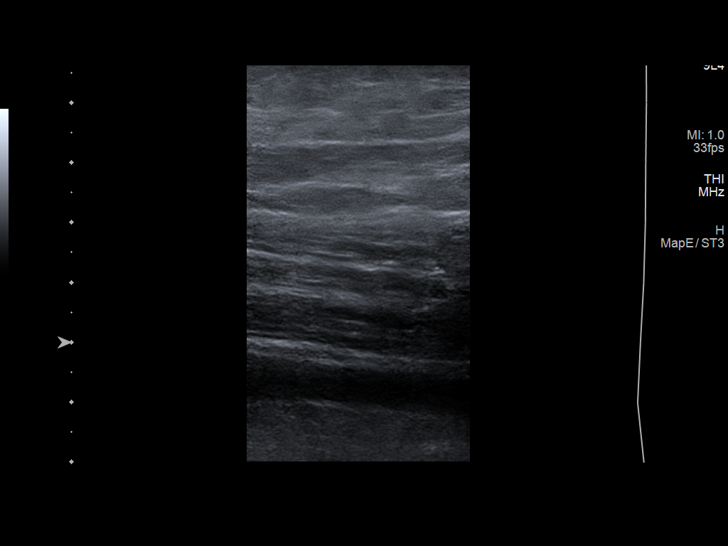
[im 24/43]
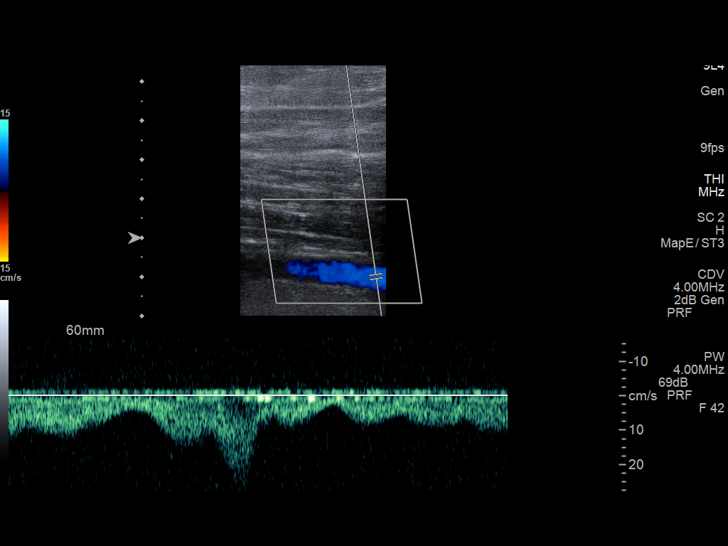
[im 28/43]
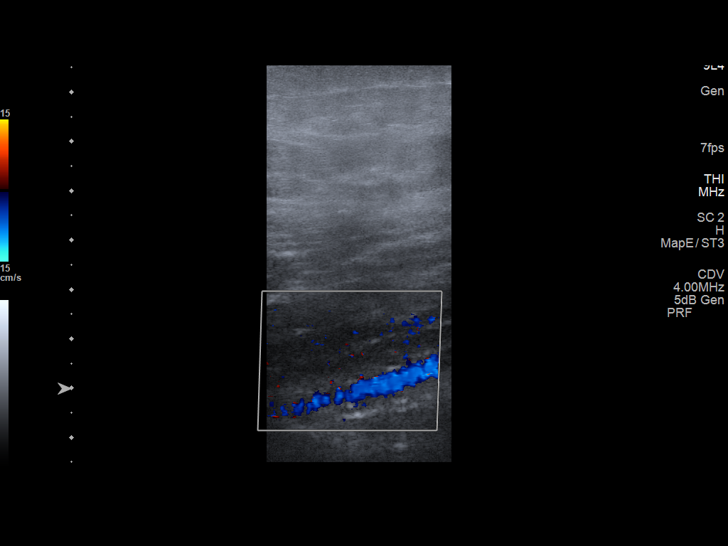
[im 32/43]
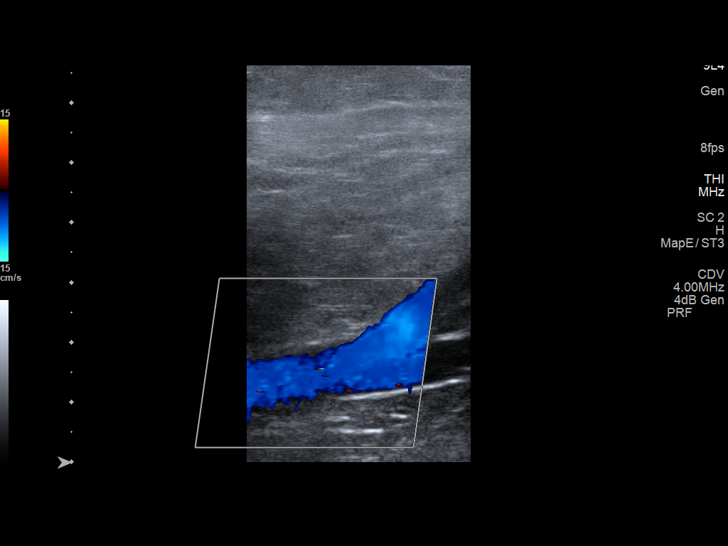
[im 35/43]
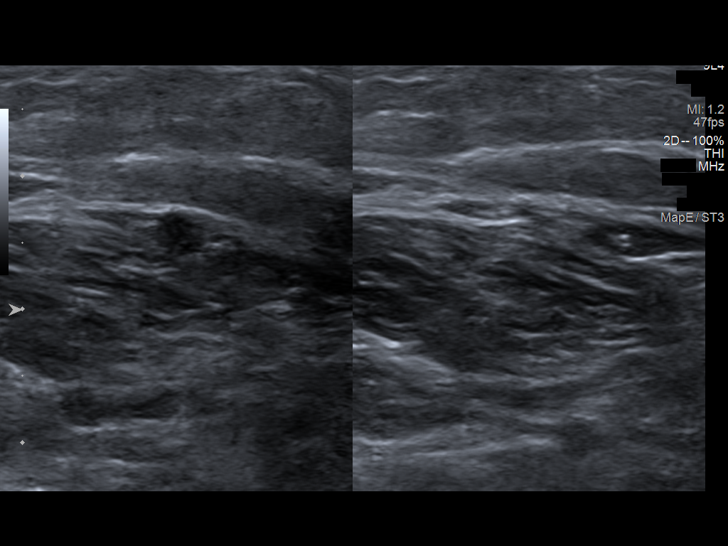
[im 39/43]
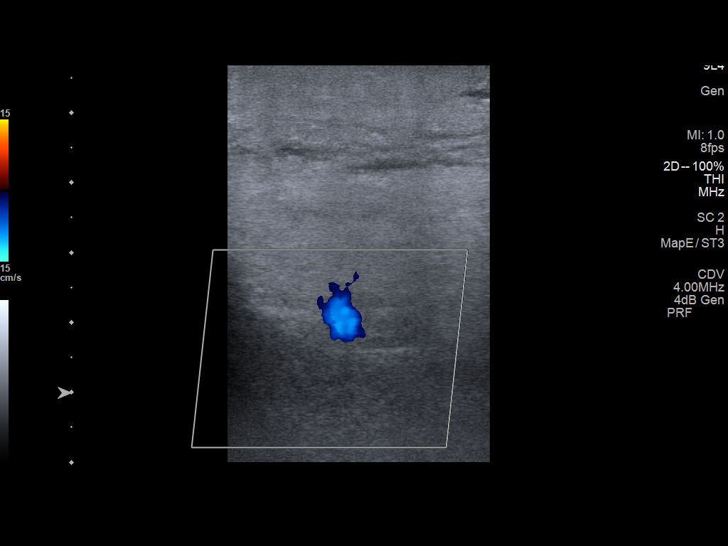
[im 43/43]
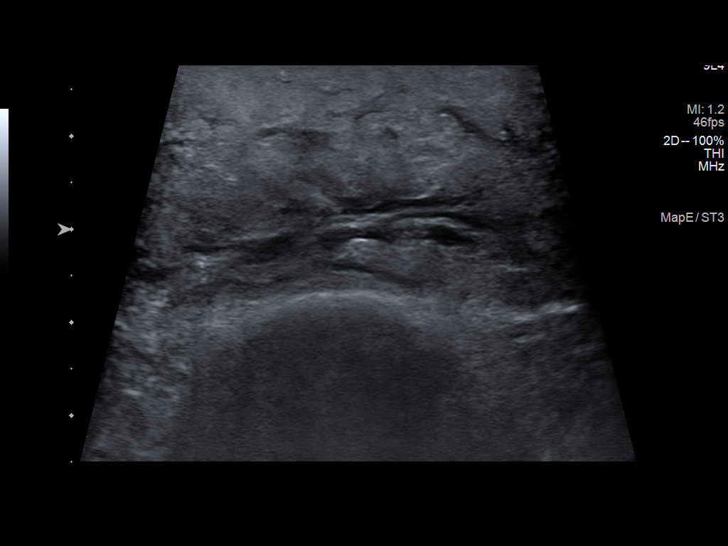

[13 of 24 positions shown; findings below may reference images not displayed]

FINDINGS: Contralateral Common Femoral Vein: Respiratory phasicity is normal
and symmetric with the symptomatic side. No evidence of thrombus.
Normal compressibility.

Common Femoral Vein: No evidence of thrombus. Normal
compressibility, respiratory phasicity and response to augmentation.

Saphenofemoral Junction: No evidence of thrombus. Normal
compressibility and flow on color Doppler imaging.

Profunda Femoral Vein: No evidence of thrombus. Normal
compressibility and flow on color Doppler imaging.

Femoral Vein: No evidence of thrombus. Normal compressibility,
respiratory phasicity and response to augmentation.

Popliteal Vein: No evidence of thrombus. Normal compressibility,
respiratory phasicity and response to augmentation.

Calf Veins: Limited evaluation of the deep calf veins due to
extensive subcutaneous edema.

Superficial Great Saphenous Vein: No evidence of thrombus. Normal
compressibility.

Venous Reflux:  None.

Other Findings:  None.
IMPRESSION: Negative for deep venous thrombosis in right lower extremity.
However, limited evaluation of the deep calf veins due to body
habitus and edema.

## 2019-07-21 ENCOUNTER — Ambulatory Visit: Payer: Self-pay | Admitting: Family Medicine

## 2019-08-19 ENCOUNTER — Other Ambulatory Visit: Payer: Self-pay | Admitting: Family Medicine

## 2019-08-29 ENCOUNTER — Other Ambulatory Visit: Payer: Self-pay | Admitting: Family Medicine

## 2019-09-04 ENCOUNTER — Other Ambulatory Visit: Payer: Self-pay | Admitting: Family Medicine

## 2019-09-19 ENCOUNTER — Other Ambulatory Visit: Payer: Self-pay | Admitting: Family Medicine

## 2019-10-15 ENCOUNTER — Ambulatory Visit (HOSPITAL_COMMUNITY)
Admission: EM | Admit: 2019-10-15 | Discharge: 2019-10-15 | Disposition: A | Discharge status: Home / Self Care | Payer: Medicare Other | Attending: Family Medicine | Admitting: Family Medicine

## 2019-10-15 ENCOUNTER — Other Ambulatory Visit: Payer: Self-pay

## 2019-10-15 ENCOUNTER — Encounter (HOSPITAL_COMMUNITY): Payer: Self-pay

## 2019-10-15 ENCOUNTER — Telehealth: Payer: Self-pay

## 2019-10-15 DIAGNOSIS — G4700 Insomnia, unspecified: Secondary | ICD-10-CM | POA: Diagnosis not present

## 2019-10-15 DIAGNOSIS — F840 Autistic disorder: Secondary | ICD-10-CM | POA: Diagnosis not present

## 2019-10-15 DIAGNOSIS — Z6841 Body Mass Index (BMI) 40.0 and over, adult: Secondary | ICD-10-CM | POA: Diagnosis not present

## 2019-10-15 DIAGNOSIS — Z79899 Other long term (current) drug therapy: Secondary | ICD-10-CM | POA: Diagnosis not present

## 2019-10-15 DIAGNOSIS — I10 Essential (primary) hypertension: Secondary | ICD-10-CM | POA: Diagnosis not present

## 2019-10-15 DIAGNOSIS — G4730 Sleep apnea, unspecified: Secondary | ICD-10-CM | POA: Insufficient documentation

## 2019-10-15 DIAGNOSIS — F79 Unspecified intellectual disabilities: Secondary | ICD-10-CM | POA: Insufficient documentation

## 2019-10-15 DIAGNOSIS — R059 Cough, unspecified: Secondary | ICD-10-CM

## 2019-10-15 DIAGNOSIS — Z20822 Contact with and (suspected) exposure to covid-19: Secondary | ICD-10-CM | POA: Diagnosis not present

## 2019-10-15 DIAGNOSIS — R05 Cough: Secondary | ICD-10-CM | POA: Insufficient documentation

## 2019-10-15 DIAGNOSIS — R7302 Impaired glucose tolerance (oral): Secondary | ICD-10-CM | POA: Diagnosis not present

## 2019-10-15 DIAGNOSIS — L03115 Cellulitis of right lower limb: Secondary | ICD-10-CM | POA: Diagnosis not present

## 2019-10-15 MED ORDER — SULFAMETHOXAZOLE-TRIMETHOPRIM 800-160 MG PO TABS: 1.0000 | ORAL_TABLET | Freq: Two times a day (BID) | ORAL | 0 refills | Status: AC

## 2019-10-15 NOTE — Discharge Instructions (Signed)
Increase the lasix to 40 mg a day for 3 days Give the antibiotic 2 x a day for 10 days Elevate leg Call your PCP on Monday if not improving COVID test will be available on My Chart

## 2019-10-15 NOTE — Telephone Encounter (Signed)
States his left leg per [facility] reporting was red and swollen- no fever. Mother reported that it had been going on for awhile and the dr was aware but I see in the chart that he was treated for RIGHT leg cellulitis in Oct and no other encounters or treatment. Mother had called earlier and Tamela Oddi spoke with her and she was c/o Jeffrey coughing and there were no openings today and she recommended UC. Nurse at [facility] will reach out to her and see if she will call and make appt for evaluation of leg complaint

## 2019-10-15 NOTE — Telephone Encounter (Signed)
Please call Scott Mendez regarding PT

## 2019-10-15 NOTE — ED Triage Notes (Signed)
Pt presents with non productive cough for past few weeks that seems to have improved a bit according to caregiver.  Pt also presents with right leg swelling and skin infection for past few weeks that is progressing.

## 2019-10-15 NOTE — ED Provider Notes (Signed)
[facility]    CSN: [id number] Arrival date & time: 10/15/19  1626      History   Chief Complaint Chief Complaint  Patient presents with  . Leg Swelling  . Skin Infection  . Cough    HPI Scott Mendez is a 39 y.o. male.   HPI  Patient is here with his mother/guardian Mother called PCP today because of increased redness in his right leg, increased swelling in the leg, limping on this leg and indicating that it was painful.  She is worried about cellulitis.  Has had cellulitis in this leg before.  She showed it to the home nurse when she came today.  The home nurse recommended medical evaluation.  He has not had any fever or chills.  There is no open wound He also has had a mild cough for about 4 weeks.  Mother states is getting better.  No fever or chills.  No change in appetite.  No body aches or headache, that she knows of.  He does not communicate well.  He does have autism. He is compliant with his medication. He is under the care of Syliva Overman, MD.  Past Medical History:  Diagnosis Date  . Autism   . Hypertension    states under control with meds., has been on med. x 8 yr.  . Morbid obesity with BMI of 45.0-49.9, adult (HCC)   . Nonrestorable tooth 02/2018   x 2  . Nonverbal   . Sleep apnea    mild - no CPAP    Patient Active Problem List   Diagnosis Date Noted  . Dry skin dermatitis 10/22/2018  . Dandruff in adult 10/11/2018  . Cellulitis of right leg 08/19/2018  . Right leg swelling 07/15/2018  . 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 Snoring 09/09/2017  . Insomnia 12/01/2014  . Autism 12/22/2012  . IMPAIRED GLUCOSE TOLERANCE 08/17/2010  . Impaired fasting glucose 08/17/2010  . Seasonal allergies 11/18/2009  . Essential hypertension 10/11/2009  . Morbid obesity (HCC) 10/31/2007  . 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 CYST EXCISION Right 03/03/2002   mandible  . MULTIPLE TOOTH EXTRACTIONS  03/03/2002   #1, 16, 17, 30, 32  . TOENAIL EXCISION Bilateral    great toe  . TOOTH EXTRACTION Right 09/03/2014   Procedure: EXTRACTION MOLAR LOWER RIGHT;  Surgeon: Georgia Lopes, DDS;  Location: [facility];  Service: Oral Surgery;  Laterality: Right;  . TOOTH EXTRACTION N/A 03/06/2018   Procedure: EXTRACTION X 1;  Surgeon: Ocie Doyne, DDS;  Location: [facility];  Service: Oral Surgery;  Laterality: N/A;       Home Medications    Prior to Admission medications   Medication Sig Start Date End Date Taking? Authorizing Provider  furosemide (LASIX) 20 MG tablet TAKE 1 TABLET BY MOUTH ONCE A DAY. 09/21/19   Kerri Perches, MD  loratadine (CLARITIN) 10 MG tablet TAKE ONE TABLET BY MOUTH ONCE DAILY. 09/21/19   Kerri Perches, MD  montelukast (SINGULAIR) 10 MG tablet TAKE ONE TABLET BY MOUTH AT BEDTIME. 09/07/19   Kerri Perches, MD  mupirocin cream Idelle Jo) 2 % Apply two times daily to affected area for 5 days 10/22/18   Kerri Perches, MD  olmesartan-hydrochlorothiazide (BENICAR HCT) 20-12.5 MG tablet TAKE ONE TABLET BY MOUTH ONCE DAILY. 08/31/19   Kerri Perches,  MD  potassium chloride SA (KLOR-CON) 20 MEQ tablet TAKE ONE TABLET BY MOUTH TWICE A DAY 08/31/19   Kerri Perches, MD  selenium sulfide (SELSUN) 1 % LOTN Wash  Hair daily with 12 cc shampoo for 3 weeks , then twice weekly 10/08/18   Kerri Perches, MD  sulfamethoxazole-trimethoprim (BACTRIM DS) 800-160 MG tablet Take 1 tablet by mouth 2 (two) times daily for 10 days. 10/15/19 10/25/19  Eustace Moore, MD  traZODone (DESYREL) 100 MG tablet Take 1 tablet (100 mg total) by mouth at bedtime. 05/14/19   Kerri Perches, MD    Family History Family History  Problem Relation Age of Onset  . Hypertension Mother   . Hypertension Father   . Diabetes Father   . Heart disease Maternal Grandfather   .  Diabetes Paternal Grandmother   . Seizures Maternal Uncle   . CVA Maternal Grandmother   . Heart disease Paternal Grandfather     Social History Social History   Tobacco Use  . Smoking status: Never Smoker  . Smokeless tobacco: Never Used  Substance Use Topics  . Alcohol use: No  . Drug use: No     Allergies   Patient has no known allergies.   Review of Systems Review of Systems  Constitutional: Negative for chills and fever.  HENT: Negative for congestion and rhinorrhea.   Respiratory: Positive for cough. Negative for shortness of breath and wheezing.   Cardiovascular: Positive for leg swelling.  Skin: Positive for color change and wound.     Physical Exam Triage Vital Signs ED Triage Vitals  Enc Vitals Group     BP 10/15/19 1735 (!) 139/93     Pulse Rate 10/15/19 1735 95     Resp 10/15/19 1735 18     Temp 10/15/19 1735 98.5 F (36.9 C)     Temp Source 10/15/19 1735 Oral     SpO2 10/15/19 1735 97 %     Weight --      Height --      Head Circumference --      Peak Flow --      Pain Score 10/15/19 1808 4     Pain Loc --      Pain Edu? --      Excl. in GC? --    No data found.  Updated Vital Signs BP (!) 139/93 (BP Location: Right Arm)   Pulse 95   Temp 98.5 F (36.9 C) (Oral)   Resp 18   SpO2 97%     Physical Exam Vitals and nursing note reviewed.  Constitutional:      General: He is not in acute distress.    Appearance: He is well-developed. He is obese. He is not ill-appearing.     Comments: Patient is nonverbal.  He does not answer my questions.  HENT:     Head: Normocephalic and atraumatic.     Mouth/Throat:     Comments: Mask is in place Eyes:     Conjunctiva/sclera: Conjunctivae normal.     Pupils: Pupils are equal, round, and reactive to light.  Cardiovascular:     Rate and Rhythm: Normal rate and regular rhythm.     Heart sounds: Normal heart sounds.  Pulmonary:     Effort: Pulmonary effort is normal. No respiratory distress.      Breath sounds: Normal breath sounds. No wheezing or rales.  Musculoskeletal:        General: Swelling present. Normal range of motion.  Cervical back: Normal range of motion.     Comments: Right lower extremity has tense edema to the knee.  There is an indurated area mid shin with thickened skin and scaling.  No bleeding or exudate.  The area appears tender.  Palpation of the calf appears tender.  Movement of the ankle appears painful.  No tenderness to deep palpation of the calf in the area of the deep veins.  Suspect cellulitis, less likely venous thrombosis  Lymphadenopathy:     Cervical: No cervical adenopathy.  Skin:    General: Skin is warm and dry.  Psychiatric:     Comments: Behavior as expected for diagnoses      UC Treatments / Results  Labs (all labs ordered are listed, but only abnormal results are displayed) Labs Reviewed  SARS CORONAVIRUS 2 (TAT 6-24 HRS)    EKG   Radiology No results found.  Procedures Procedures (including critical care time)  Medications Ordered in UC Medications - No data to display  Initial Impression / Assessment and Plan / UC Course  I have reviewed the triage vital signs and the nursing notes.  Pertinent labs & imaging results that were available during my care of the patient were reviewed by me and considered in my medical decision making (see chart for details).     Discussed with mother warmth.  Elevation.  Staying off her feet for couple of days.  Increase Lasix for 3 days only.  Septra 2 times a day for 10 days.  Call Dr. Moshe Cipro on Monday with a report.  Go to the ER sooner for any worsening in symptoms, increased redness, or fever. Final Clinical Impressions(s) / UC Diagnoses   Final diagnoses:  Cough  Cellulitis of leg, right     Discharge Instructions     Increase the lasix to 40 mg a day for 3 days Give the antibiotic 2 x a day for 10 days Elevate leg Call your PCP on Monday if not improving COVID test will be  available on My Chart   ED Prescriptions    Medication Sig Dispense Auth. Provider   sulfamethoxazole-trimethoprim (BACTRIM DS) 800-160 MG tablet Take 1 tablet by mouth 2 (two) times daily for 10 days. 20 tablet Raylene Everts, MD     PDMP not reviewed this encounter.   Raylene Everts, MD 10/15/19 [place]

## 2019-10-15 NOTE — Telephone Encounter (Signed)
Called patient and left message for them to return call at the office   

## 2019-10-16 LAB — SARS CORONAVIRUS 2 (TAT 6-24 HRS): SARS Coronavirus 2: NEGATIVE

## 2019-10-19 ENCOUNTER — Other Ambulatory Visit: Payer: Self-pay | Admitting: Family Medicine

## 2019-11-04 DIAGNOSIS — R7301 Impaired fasting glucose: Secondary | ICD-10-CM | POA: Diagnosis not present

## 2019-11-04 DIAGNOSIS — I10 Essential (primary) hypertension: Secondary | ICD-10-CM | POA: Diagnosis not present

## 2019-11-04 DIAGNOSIS — E559 Vitamin D deficiency, unspecified: Secondary | ICD-10-CM | POA: Diagnosis not present

## 2019-11-05 LAB — CBC
HCT: 43.9 % (ref 38.5–50.0)
Hemoglobin: 14.5 g/dL (ref 13.2–17.1)
MCH: 30 pg (ref 27.0–33.0)
MCHC: 33 g/dL (ref 32.0–36.0)
MCV: 90.7 fL (ref 80.0–100.0)
MPV: 9.5 fL (ref 7.5–12.5)
Platelets: 291 10*3/uL (ref 140–400)
RBC: 4.84 10*6/uL (ref 4.20–5.80)
RDW: 12.2 % (ref 11.0–15.0)
WBC: 8.1 10*3/uL (ref 3.8–10.8)

## 2019-11-05 LAB — COMPLETE METABOLIC PANEL WITH GFR
AG Ratio: 1.8 (calc) (ref 1.0–2.5)
ALT: 47 U/L — ABNORMAL HIGH (ref 9–46)
AST: 30 U/L (ref 10–40)
Albumin: 4.2 g/dL (ref 3.6–5.1)
Alkaline phosphatase (APISO): 84 U/L (ref 36–130)
BUN: 19 mg/dL (ref 7–25)
CO2: 31 mmol/L (ref 20–32)
Calcium: 9.1 mg/dL (ref 8.6–10.3)
Chloride: 104 mmol/L (ref 98–110)
Creat: 1.01 mg/dL (ref 0.60–1.35)
GFR, Est African American: 110 mL/min/{1.73_m2} (ref >=60)
GFR, Est Non African American: 95 mL/min/{1.73_m2} (ref >=60)
Globulin: 2.4 g/dL (calc) (ref 1.9–3.7)
Glucose, Bld: 88 mg/dL (ref 65–99)
Potassium: 4.4 mmol/L (ref 3.5–5.3)
Sodium: 142 mmol/L (ref 135–146)
Total Bilirubin: 0.5 mg/dL (ref 0.2–1.2)
Total Protein: 6.6 g/dL (ref 6.1–8.1)

## 2019-11-05 LAB — HEMOGLOBIN A1C
Hgb A1c MFr Bld: 5.3 % of total Hgb (ref <5.7)
Mean Plasma Glucose: 105 (calc)
eAG (mmol/L): 5.8 (calc)

## 2019-11-05 LAB — LIPID PANEL
Cholesterol: 151 mg/dL (ref <200)
HDL: 40 mg/dL (ref >=40)
LDL Cholesterol (Calc): 92 mg/dL (calc)
Non-HDL Cholesterol (Calc): 111 mg/dL (calc) (ref <130)
Total CHOL/HDL Ratio: 3.8 (calc) (ref <5.0)
Triglycerides: 91 mg/dL (ref <150)

## 2019-11-05 LAB — TSH: TSH: 1.81 mIU/L (ref 0.40–4.50)

## 2019-11-05 LAB — VITAMIN D 25 HYDROXY (VIT D DEFICIENCY, FRACTURES): Vit D, 25-Hydroxy: 12 ng/mL — ABNORMAL LOW (ref 30–100)

## 2019-11-09 ENCOUNTER — Telehealth: Payer: Self-pay

## 2019-11-09 NOTE — Telephone Encounter (Signed)
Scott Mendez is returning a call regarding Labs for Pt

## 2019-11-10 ENCOUNTER — Other Ambulatory Visit: Payer: Self-pay | Admitting: Family Medicine

## 2019-11-10 DIAGNOSIS — E559 Vitamin D deficiency, unspecified: Secondary | ICD-10-CM

## 2019-11-10 MED ORDER — VITAMIN D (ERGOCALCIFEROL) 1.25 MG (50000 UNIT) PO CAPS: 50000.0000 [IU] | ORAL_CAPSULE | ORAL | 3 refills | Status: DC

## 2019-11-10 NOTE — Telephone Encounter (Signed)
Returned call to [place] with verbal understanding

## 2019-11-10 NOTE — Progress Notes (Signed)
it

## 2019-11-12 ENCOUNTER — Ambulatory Visit: Payer: Medicare Other | Admitting: Family Medicine

## 2019-11-18 ENCOUNTER — Other Ambulatory Visit: Payer: Self-pay | Admitting: Family Medicine

## 2019-11-20 ENCOUNTER — Other Ambulatory Visit: Payer: Self-pay | Admitting: Family Medicine

## 2019-12-02 ENCOUNTER — Other Ambulatory Visit: Payer: Self-pay

## 2019-12-02 ENCOUNTER — Other Ambulatory Visit: Payer: Self-pay | Admitting: *Deleted

## 2019-12-02 ENCOUNTER — Encounter: Payer: Self-pay | Admitting: Family Medicine

## 2019-12-02 ENCOUNTER — Ambulatory Visit (INDEPENDENT_AMBULATORY_CARE_PROVIDER_SITE_OTHER): Payer: Medicare Other | Admitting: Family Medicine

## 2019-12-02 DIAGNOSIS — M7989 Other specified soft tissue disorders: Secondary | ICD-10-CM | POA: Diagnosis not present

## 2019-12-02 DIAGNOSIS — I10 Essential (primary) hypertension: Secondary | ICD-10-CM

## 2019-12-02 MED ORDER — OLMESARTAN MEDOXOMIL-HCTZ 20-12.5 MG PO TABS: 1.0000 | ORAL_TABLET | Freq: Every day | ORAL | 3 refills | Status: DC

## 2019-12-02 MED ORDER — POTASSIUM CHLORIDE CRYS ER 20 MEQ PO TBCR: 20.0000 meq | EXTENDED_RELEASE_TABLET | Freq: Two times a day (BID) | ORAL | 3 refills | Status: DC

## 2019-12-02 MED ORDER — MUPIROCIN 2 % EX OINT: TOPICAL_OINTMENT | CUTANEOUS | 0 refills | Status: DC

## 2019-12-02 NOTE — Assessment & Plan Note (Signed)
[name],Scott Mendez is re-educated about the importance of exercise daily to help with weight management.A minumum of 30 minutes daily is recommended. Additionally, importance of healthy food choices with portion control discussed.   Wt Readings from Last 3 Encounters:  12/02/19 (!) 364 lb (165.1 kg)  06/23/19 (!) 359 lb (162.8 kg)  05/14/19 (!) 359 lb (162.8 kg)

## 2019-12-02 NOTE — Progress Notes (Signed)
Subjective:  Patient ID: Scott Mendez, male    DOB: [birth year]  Age: 39 y.o. MRN: [id number]  CC:  Chief Complaint  Patient presents with  . Follow-up    right leg still looks bad seems like he is hopping on it       HPI  HPI  Scott Mendez is a 39 year old male patient with autism, morbid obesity, hypertension, nonverbal, sleep apnea.  Mother reports that his main caregiver today.  Overall doing well taking medications without any issues or concerns.  Only issue today is that his right leg which was treated for cellulitis still seems to have a little bit of skin breakdown and changes though there is no noted swelling warmth redness or drainage that would insist that cellulitis is still present.  Reports that she has been using Neosporin but it just does not do the job that the Bactroban used and wanted to know if she get a refill of this.  Today patient denies signs and symptoms of COVID 19 infection including fever, chills, cough, shortness of breath, and headache. Past Medical, Surgical, Social History, Allergies, and Medications have been Reviewed.   Past Medical History:  Diagnosis Date  . Autism   . Hypertension    states under control with meds., has been on med. x 8 yr.  . Morbid obesity with BMI of 45.0-49.9, adult (HCC)   . Nonrestorable tooth 02/2018   x 2  . Nonverbal   . Sleep apnea    mild - no CPAP    Current Meds  Medication Sig  . furosemide (LASIX) 20 MG tablet TAKE 1 TABLET BY MOUTH ONCE A DAY.  Marland Kitchen loratadine (CLARITIN) 10 MG tablet TAKE ONE TABLET BY MOUTH ONCE DAILY.  . montelukast (SINGULAIR) 10 MG tablet TAKE ONE TABLET BY MOUTH AT BEDTIME.  Marland Kitchen selenium sulfide (SELSUN) 1 % LOTN Wash  Hair daily with 12 cc shampoo for 3 weeks , then twice weekly  . traZODone (DESYREL) 100 MG tablet Take 1 tablet (100 mg total) by mouth at bedtime.  . Vitamin D, Ergocalciferol, (DRISDOL) 1.25 MG (50000 UNIT) CAPS capsule Take 1 capsule (50,000 Units total)  by mouth every 7 (seven) days.  . [DISCONTINUED] olmesartan-hydrochlorothiazide (BENICAR HCT) 20-12.5 MG tablet TAKE ONE TABLET BY MOUTH ONCE DAILY.  . [DISCONTINUED] potassium chloride SA (KLOR-CON) 20 MEQ tablet TAKE ONE TABLET BY MOUTH TWICE A DAY    ROS:  Review of Systems  Constitutional: Negative.   HENT: Negative.   Eyes: Negative.   Respiratory: Negative.   Cardiovascular: Positive for leg swelling.  Gastrointestinal: Negative.   Genitourinary: Negative.   Musculoskeletal: Negative.   Skin: Negative.        Skin break down  Neurological: Negative.   Endo/Heme/Allergies: Negative.   Psychiatric/Behavioral: Negative.   All other systems reviewed and are negative.    Objective:   Today's Vitals: BP 127/84 (BP Location: Right Arm, Patient Position: Sitting, Cuff Size: Normal)   Pulse 93   Temp (!) 97.5 F (36.4 C) (Temporal)   Ht 6\' 1"  (1.854 m)   Wt (!) 364 lb (165.1 kg)   SpO2 94%   BMI 48.02 kg/m  Vitals with BMI 12/02/2019 10/15/2019 06/23/2019  Height 6\' 1"  - 6\' 1"   Weight 364 lbs - 359 lbs  BMI 48.03 - 47.37  Systolic 127 139 06/25/2019  Diastolic 84 93 86  Pulse 93 95 91  Some encounter information is confidential and restricted. Go to Review Flowsheets activity  to see all data.     Physical Exam Vitals and nursing note reviewed.  Constitutional:      Appearance: Normal appearance. He is well-developed and well-groomed. He is morbidly obese.  HENT:     Head: Normocephalic and atraumatic.     Right Ear: External ear normal.     Left Ear: External ear normal.     Nose: Nose normal.     Mouth/Throat:     Mouth: Mucous membranes are moist.     Pharynx: Oropharynx is clear.     Comments: Mask in place  Eyes:     General:        Right eye: No discharge.        Left eye: No discharge.     Conjunctiva/sclera: Conjunctivae normal.  Cardiovascular:     Rate and Rhythm: Normal rate and regular rhythm.     Pulses: Normal pulses.     Heart sounds: Normal heart  sounds.  Pulmonary:     Effort: Pulmonary effort is normal.     Breath sounds: Normal breath sounds.  Musculoskeletal:        General: Normal range of motion.     Cervical back: Normal range of motion and neck supple.     Right lower leg: Edema present.     Left lower leg: Edema present.  Skin:    General: Skin is warm.     Comments: Skin discoloration and breakdown from previously healed cellulitis. No noted cellulitis today.   Neurological:     General: No focal deficit present.     Mental Status: He is alert and oriented to person, place, and time.  Psychiatric:        Attention and Perception: He is inattentive.        Mood and Affect: Affect is flat.        Behavior: Behavior is slowed. Behavior is cooperative.     Comments: Limited communication     Assessment   1. Morbid obesity (Guys Zilda No)   2. Essential hypertension   3. Right leg swelling     Tests ordered No orders of the defined types were placed in this encounter.    Plan: Please see assessment and plan per problem list above.   No orders of the defined types were placed in this encounter.   Patient to follow-up in 3-4 months  Perlie Mayo, NP

## 2019-12-02 NOTE — Assessment & Plan Note (Signed)
Chancellor L Ehlert is encouraged to maintain a well balanced diet that is low in salt. Controlled, continue current medication regimen.  Additionally, he is also reminded that exercise is beneficial for heart health and control of  Blood pressure. 30-60 minutes daily is recommended-walking was suggested.

## 2019-12-02 NOTE — Assessment & Plan Note (Signed)
Persistent leg swelling, reduction in salt was encouraged, continued use of compression socks as can.  And elevation as he would allow.  Does have some noted skin changes possible lymph changes in the leg itself.  Pulses are present

## 2019-12-02 NOTE — Patient Instructions (Addendum)
I appreciate the opportunity to provide you with care for your health and wellness. Today we discussed: leg swelling/skin break down  Follow up: 4 months   No labs or referrals today  If the medication does not help the skin on his skin, please call us back.  Happy Spring and Summer :)  Please continue to practice social distancing to keep you, your family, and our community safe.  If you must go out, please wear a mask and practice good handwashing.  It was a pleasure to see you and I look forward to continuing to work together on your health and well-being. Please do not hesitate to call the office if you need care or have questions about your care.  Have a wonderful day and week. With Gratitude, Tereasa Coop, DNP, AGNP-BC

## 2019-12-26 ENCOUNTER — Other Ambulatory Visit: Payer: Self-pay | Admitting: Family Medicine

## 2020-01-08 ENCOUNTER — Other Ambulatory Visit: Payer: Self-pay | Admitting: Family Medicine

## 2020-02-02 ENCOUNTER — Telehealth: Payer: Self-pay | Admitting: Family Medicine

## 2020-02-02 NOTE — Telephone Encounter (Signed)
Scott Mendez given list to fax

## 2020-02-02 NOTE — Telephone Encounter (Signed)
Patients mother called and would like someone to fax a medication list signed by Dr. Lodema Hong to [facility] Fax number [phone] and would like someone to call her and let her know when it has been faxed.

## 2020-02-10 ENCOUNTER — Other Ambulatory Visit: Payer: Self-pay | Admitting: Family Medicine

## 2020-03-16 ENCOUNTER — Other Ambulatory Visit: Payer: Self-pay

## 2020-03-16 ENCOUNTER — Telehealth: Payer: Self-pay

## 2020-03-16 DIAGNOSIS — E559 Vitamin D deficiency, unspecified: Secondary | ICD-10-CM

## 2020-03-16 MED ORDER — MONTELUKAST SODIUM 10 MG PO TABS: 10.0000 mg | ORAL_TABLET | Freq: Every day | ORAL | 5 refills | Status: DC

## 2020-03-16 MED ORDER — VITAMIN D (ERGOCALCIFEROL) 1.25 MG (50000 UNIT) PO CAPS: 50000.0000 [IU] | ORAL_CAPSULE | ORAL | 5 refills | Status: DC

## 2020-03-16 NOTE — Telephone Encounter (Signed)
Burna Mortimer (guardian) called for a refill on montelukast and vitamin D

## 2020-04-13 ENCOUNTER — Other Ambulatory Visit: Payer: Self-pay

## 2020-04-13 ENCOUNTER — Other Ambulatory Visit: Payer: Self-pay | Admitting: Family Medicine

## 2020-04-13 ENCOUNTER — Encounter: Payer: Self-pay | Admitting: Family Medicine

## 2020-04-13 ENCOUNTER — Ambulatory Visit (INDEPENDENT_AMBULATORY_CARE_PROVIDER_SITE_OTHER): Payer: Medicare Other | Admitting: Family Medicine

## 2020-04-13 VITALS — BP 128/82 | HR 105 | Resp 16 | Ht 73.0 in | Wt 372.0 lb

## 2020-04-13 DIAGNOSIS — M7989 Other specified soft tissue disorders: Secondary | ICD-10-CM | POA: Diagnosis not present

## 2020-04-13 DIAGNOSIS — L03115 Cellulitis of right lower limb: Secondary | ICD-10-CM | POA: Diagnosis not present

## 2020-04-13 DIAGNOSIS — Z23 Encounter for immunization: Secondary | ICD-10-CM | POA: Diagnosis not present

## 2020-04-13 DIAGNOSIS — Z1159 Encounter for screening for other viral diseases: Secondary | ICD-10-CM

## 2020-04-13 DIAGNOSIS — I10 Essential (primary) hypertension: Secondary | ICD-10-CM | POA: Diagnosis not present

## 2020-04-13 MED ORDER — FUROSEMIDE 20 MG PO TABS: 20.0000 mg | ORAL_TABLET | Freq: Two times a day (BID) | ORAL | 1 refills | Status: DC

## 2020-04-13 MED ORDER — CEPHALEXIN 500 MG PO CAPS: 500.0000 mg | ORAL_CAPSULE | Freq: Two times a day (BID) | ORAL | 0 refills | Status: DC

## 2020-04-13 NOTE — Assessment & Plan Note (Signed)
  Patient re-educated about  the importance of commitment to a  minimum of 150 minutes of exercise per week as able.  The importance of healthy food choices with portion control discussed, as well as eating regularly and within a 12 hour window most days. The need to choose "clean , green" food 50 to 75% of the time is discussed, as well as to make water the primary drink and set a goal of 64 ounces water daily.    Weight /BMI 04/13/2020 12/02/2019 06/23/2019  WEIGHT 372 lb 364 lb 359 lb  HEIGHT 6\' 1"  6\' 1"  6\' 1"   BMI 49.08 kg/m2 48.02 kg/m2 47.36 kg/m2  Some encounter information is confidential and restricted. Go to Review Flowsheets activity to see all data.

## 2020-04-13 NOTE — Assessment & Plan Note (Signed)
2 week histgory, will double furosemide and re assess in 2 weeks, add potassium

## 2020-04-13 NOTE — Assessment & Plan Note (Signed)
Controlled, no change in medication DASH diet and commitment to daily physical activity for a minimum of 30 minutes discussed and encouraged, as a part of hypertension management. The importance of attaining a healthy weight is also discussed.  BP/Weight 04/13/2020 12/02/2019 10/15/2019 06/23/2019 05/14/2019 01/01/2019 10/22/2018  Systolic BP 128 127 139 138 132 130 130  Diastolic BP 82 84 93 86 86 86 86  Wt. (Lbs) 372 364 - 359 359 356 356  BMI 49.08 48.02 - 47.36 47.36 46.97 46.97  Some encounter information is confidential and restricted. Go to Review Flowsheets activity to see all data.

## 2020-04-13 NOTE — Progress Notes (Signed)
Scott Mendez     MRN: [id number]      DOB: 06-16-1981   HPI History from mom as pt is autisitc Mr. Gause is here for follow up and re-evaluation of chronic medical conditions, medication management and review of any available recent lab and radiology data.  Preventive health is updated, specifically  Cancer screening and Immunization.   Questions or concerns regarding consultations or procedures which the PT has had in the interim are  addressed. The PT denies any adverse reactions to current medications since the last visit.  Right leg swelling and redness x 1 week, has had similar problem in the past. No recent trauma , no fever or chills  ROS Denies recent fever or chills. Denies sinus pressure, nasal congestion, ear pain or sore throat. Denies chest congestion, productive cough or wheezing. Denies chest pains, palpitations and leg swelling Denies abdominal pain, nausea, vomiting,diarrhea or constipation.   Denies dysuria, frequency, hesitancy or incontinence. Denies joint pain, swelling and limitation in mobility. Denies headaches, seizures, numbness, or tingling. Denies depression, anxiety or insomnia.    PE  BP 128/82   Pulse (!) 105   Resp 16   Ht 6\' 1"  (1.854 m)   Wt (!) 372 lb (168.7 kg)   SpO2 95%   BMI 49.08 kg/m   Patient alert and oriented and in no cardiopulmonary distress.  HEENT: No facial asymmetry, EOMI,     Neck supple .  Chest: Clear to auscultation bilaterally.  CVS: S1, S2 no murmurs, no S3.Regular rate.  ABD: Soft non tender.   Ext: 2 plus pitting edema of right lower extremity with hyperpigmentations and erythema of lower 1/3 anterior leg No purulent drainage noted MS: Adequate ROM spine, shoulders, hips and knees.  Skin: Intact, no ulcerations or rash noted.  Psych: not anxious or depressed  appearing.  CNS:  power,  normal throughout.no focal deficits noted.   Assessment & Plan  Right leg swelling 2 week histgory,  will double furosemide and re assess in 2 weeks, add potassium  Cellulitis of right leg 1 week redness and drainage, antibiotic x 10 days  Essential hypertension Controlled, no change in medication DASH diet and commitment to daily physical activity for a minimum of 30 minutes discussed and encouraged, as a part of hypertension management. The importance of attaining a healthy weight is also discussed.  BP/Weight 04/13/2020 12/02/2019 10/15/2019 06/23/2019 05/14/2019 01/01/2019 10/22/2018  Systolic BP 128 127 139 138 132 130 130  Diastolic BP 82 84 93 86 86 86 86  Wt. (Lbs) 372 364 - 359 359 356 356  BMI 49.08 48.02 - 47.36 47.36 46.97 46.97  Some encounter information is confidential and restricted. Go to Review Flowsheets activity to see all data.       Morbid obesity  Patient re-educated about  the importance of commitment to a  minimum of 150 minutes of exercise per week as able.  The importance of healthy food choices with portion control discussed, as well as eating regularly and within a 12 hour window most days. The need to choose "clean , green" food 50 to 75% of the time is discussed, as well as to make water the primary drink and set a goal of 64 ounces water daily.    Weight /BMI 04/13/2020 12/02/2019 06/23/2019  WEIGHT 372 lb 364 lb 359 lb  HEIGHT 6\' 1"  6\' 1"  6\' 1"   BMI 49.08 kg/m2 48.02 kg/m2 47.36 kg/m2  Some encounter information is confidential and restricted. Go  to Review Flowsheets activity to see all data.

## 2020-04-13 NOTE — Patient Instructions (Addendum)
F/U in office re evaluate leg swelling in 3 to 4 weeks, with mD, call if you need em sooner  Flu vaccine today  Furosemide dose is increased due to leg swelling and antibiotic , keflex , is prescribed for 10 days   Blood pressure is good  Non Fasting chem 7 and eGFr, and Hep C screen 5 days before follow up  Think about what you will eat, plan ahead. Choose " clean, green, fresh or frozen" over canned, processed or packaged foods which are more sugary, salty and fatty. 70 to 75% of food eaten should be vegetables and fruit. Three meals at set times with snacks allowed between meals, but they must be fruit or vegetables. Aim to eat over a 12 hour period , example 7 am to 7 pm, and STOP after  your last meal of the day. Drink water,generally about 64 ounces per day, no other drink is as healthy. Fruit juice is best enjoyed in a healthy way, by EATING the fruit. It is important that you exercise regularly at least 30 minutes 5 times a week. If you develop chest pain, have severe difficulty breathing, or feel very tired, stop exercising immediately and seek medical attention  Thanks for choosing [facility], we consider it a privelige to serve you.

## 2020-04-13 NOTE — Assessment & Plan Note (Signed)
1 week redness and drainage, antibiotic x 10 days

## 2020-04-24 ENCOUNTER — Other Ambulatory Visit: Payer: Self-pay | Admitting: Family Medicine

## 2020-05-11 ENCOUNTER — Ambulatory Visit (INDEPENDENT_AMBULATORY_CARE_PROVIDER_SITE_OTHER): Payer: Medicare Other | Admitting: Family Medicine

## 2020-05-11 ENCOUNTER — Ambulatory Visit (HOSPITAL_COMMUNITY)
Admission: RE | Admit: 2020-05-11 | Discharge: 2020-05-11 | Disposition: A | Discharge status: Home / Self Care | Payer: Medicare Other | Source: Ambulatory Visit | Attending: Family Medicine | Admitting: Family Medicine

## 2020-05-11 ENCOUNTER — Encounter: Payer: Self-pay | Admitting: Family Medicine

## 2020-05-11 ENCOUNTER — Other Ambulatory Visit: Payer: Self-pay

## 2020-05-11 VITALS — BP 134/80 | HR 103 | Ht 73.0 in | Wt 364.0 lb

## 2020-05-11 DIAGNOSIS — I890 Lymphedema, not elsewhere classified: Secondary | ICD-10-CM | POA: Diagnosis not present

## 2020-05-11 DIAGNOSIS — E669 Obesity, unspecified: Secondary | ICD-10-CM

## 2020-05-11 DIAGNOSIS — L819 Disorder of pigmentation, unspecified: Secondary | ICD-10-CM

## 2020-05-11 DIAGNOSIS — L03115 Cellulitis of right lower limb: Secondary | ICD-10-CM | POA: Diagnosis not present

## 2020-05-11 DIAGNOSIS — R600 Localized edema: Secondary | ICD-10-CM | POA: Diagnosis not present

## 2020-05-11 DIAGNOSIS — I10 Essential (primary) hypertension: Secondary | ICD-10-CM | POA: Diagnosis not present

## 2020-05-11 DIAGNOSIS — R238 Other skin changes: Secondary | ICD-10-CM

## 2020-05-11 DIAGNOSIS — M7989 Other specified soft tissue disorders: Secondary | ICD-10-CM

## 2020-05-11 MED ORDER — CEPHALEXIN 500 MG PO CAPS: 500.0000 mg | ORAL_CAPSULE | Freq: Three times a day (TID) | ORAL | 0 refills | Status: DC

## 2020-05-11 NOTE — Patient Instructions (Addendum)
   F/U in January, call if you need me sooner  You need [place] of right leg today  You are referred urgently to wound and also to vascular  Re right leg swelling   1 week of keflex is prescribed  For bug bites on right forearm

## 2020-05-11 NOTE — Assessment & Plan Note (Signed)
Persistent  Right leg swelling x 3 weeks up to calf woith  Hyperpigmentation and skin breakdown anteriorly, no response to diuretic, refer back to vascular as well as to wound

## 2020-05-13 NOTE — Assessment & Plan Note (Signed)
Improved but skin breakdown still present, keflex prescribe and refer to wound care

## 2020-05-13 NOTE — Assessment & Plan Note (Signed)
Controlled, no change in medication DASH diet and commitment to daily physical activity for a minimum of 30 minutes discussed and encouraged, as a part of hypertension management. The importance of attaining a healthy weight is also discussed.  BP/Weight 05/11/2020 04/13/2020 12/02/2019 10/15/2019 06/23/2019 05/14/2019 01/01/2019  Systolic BP 134 128 127 139 138 132 130  Diastolic BP 80 82 84 93 86 86 86  Wt. (Lbs) 364.04 372 364 - 359 359 356  BMI 48.03 49.08 48.02 - 47.36 47.36 46.97  Some encounter information is confidential and restricted. Go to Review Flowsheets activity to see all data.

## 2020-05-13 NOTE — Progress Notes (Signed)
   Scott Mendez     MRN: [id number]      DOB: 1981-04-17   HPI History from Mother Mr. Pajak is here for follow up and re-evaluation right leg swelling which has progressively worsened, and now is having weeping and ulceration of skin over anterior leg. Denies fevr or chills, no trauma to leg C/o red rash on forearm in past 1 week, looks like bug bites, no drainage from same. ROS Denies recent fever or chills. Denies sinus pressure, nasal congestion, ear pain or sore throat. Denies chest congestion, productive cough or wheezing.  tingling. Denies depression, anxiety or insomnia. Marland Kitchen   PE  BP 134/80 (BP Location: Right Arm, Patient Position: Sitting, Cuff Size: Large)   Pulse (!) 103   Ht 6\' 1"  (1.854 m)   Wt (!) 364 lb 0.6 oz (165.1 kg)   SpO2 98%   BMI 48.03 kg/m   Patient alert  and in no cardiopulmonary distress.Autisitic  HEENT: No facial asymmetry, EOMI,     Neck supple .  Chest: Clear to auscultation bilaterally.  CVS: S1, S2 no murmurs, no S3.Regular rate.  ABD: Soft non tender.   Ext: 3 plus  Edema of RLE from fooy extending up to knee  MS: Adequate ROM spine, shoulders, hips and knees.  Skin: hyperpigmentation and erythraea of anterior aspect right leg , minor skin breakdown    CNS: CN 2-12 intact, power,  normal throughout.no focal deficits noted.   Assessment & Plan  Right leg swelling Persistent  Right leg swelling x 3 weeks up to calf woith  Hyperpigmentation and skin breakdown anteriorly, no response to diuretic, refer back to vascular as well as to wound  Cellulitis of right leg Improved but skin breakdown still present, keflex prescribe and refer to wound care  Essential hypertension Controlled, no change in medication DASH diet and commitment to daily physical activity for a minimum of 30 minutes discussed and encouraged, as a part of hypertension management. The importance of attaining a healthy weight is also  discussed.  BP/Weight 05/11/2020 04/13/2020 12/02/2019 10/15/2019 06/23/2019 05/14/2019 01/01/2019  Systolic BP 134 128 127 139 138 132 130  Diastolic BP 80 82 84 93 86 86 86  Wt. (Lbs) 364.04 372 364 - 359 359 356  BMI 48.03 49.08 48.02 - 47.36 47.36 46.97  Some encounter information is confidential and restricted. Go to Review Flowsheets activity to see all data.       Morbid obesity  Patient re-educated about  the importance of commitment to a  minimum of 150 minutes of exercise per week as able.  The importance of healthy food choices with portion control discussed, as well as eating regularly and within a 12 hour window most days. The need to choose "clean , green" food 50 to 75% of the time is discussed, as well as to make water the primary drink and set a goal of 64 ounces water daily.    Weight /BMI 05/11/2020 04/13/2020 12/02/2019  WEIGHT 364 lb 0.6 oz 372 lb 364 lb  HEIGHT 6\' 1"  6\' 1"  6\' 1"   BMI 48.03 kg/m2 49.08 kg/m2 48.02 kg/m2  Some encounter information is confidential and restricted. Go to Review Flowsheets activity to see all data.

## 2020-05-13 NOTE — Assessment & Plan Note (Signed)
  Patient re-educated about  the importance of commitment to a  minimum of 150 minutes of exercise per week as able.  The importance of healthy food choices with portion control discussed, as well as eating regularly and within a 12 hour window most days. The need to choose "clean , green" food 50 to 75% of the time is discussed, as well as to make water the primary drink and set a goal of 64 ounces water daily.    Weight /BMI 05/11/2020 04/13/2020 12/02/2019  WEIGHT 364 lb 0.6 oz 372 lb 364 lb  HEIGHT 6\' 1"  6\' 1"  6\' 1"   BMI 48.03 kg/m2 49.08 kg/m2 48.02 kg/m2  Some encounter information is confidential and restricted. Go to Review Flowsheets activity to see all data.

## 2020-05-18 ENCOUNTER — Other Ambulatory Visit: Payer: Self-pay

## 2020-05-18 ENCOUNTER — Other Ambulatory Visit: Payer: Self-pay | Admitting: Family Medicine

## 2020-05-18 ENCOUNTER — Ambulatory Visit (INDEPENDENT_AMBULATORY_CARE_PROVIDER_SITE_OTHER): Payer: Medicare Other | Admitting: Cardiothoracic Surgery

## 2020-05-18 ENCOUNTER — Encounter: Payer: Self-pay | Admitting: Cardiothoracic Surgery

## 2020-05-18 VITALS — BP 135/84 | HR 110 | Temp 97.6°F | Resp 14 | Ht 69.0 in | Wt 374.0 lb

## 2020-05-18 DIAGNOSIS — I890 Lymphedema, not elsewhere classified: Secondary | ICD-10-CM

## 2020-05-18 DIAGNOSIS — M7989 Other specified soft tissue disorders: Secondary | ICD-10-CM

## 2020-05-18 NOTE — Progress Notes (Signed)
Patient ID: Scott Mendez, male   DOB: [birth year], 39 y.o.   MRN: [id number]  Chief Complaint  Patient presents with  . New Patient (Initial Visit)    leg wound    Referred By Scott Overman, MD Reason for Referral unilateral lower extremity lymphedema  HPI Location, Quality, Duration, Severity, Timing, Context, Modifying Factors, Associated Signs and Symptoms.  Scott Mendez is a 39 y.o. male.  This patient is a 39 year old African-American gentleman who presents with a several year history of right lower extremity edema.  In 2019 he had an ultrasound done of his right lower extremity which did not reveal any evidence of deep venous thrombosis.  His lower extremities continued to increase in size and more recently had some cellulitis with some discharge from an open leg wound.  This is now subsequently healed and there is no open area at this point in time.  He is accompanied today by his mother who states that he has had this for several years.  He did at some point have a compression garment made but was not compliant with wearing it.  He is very active around the house.  He does not work.  He has some disability that allows him to only attend a adult daycare center.  He is very active there.     Past Medical History:  Diagnosis Date  . Autism   . Hypertension    states under control with meds., has been on med. x 8 yr.  . Morbid obesity with BMI of 45.0-49.9, adult (HCC)   . Nonrestorable tooth 02/2018   x 2  . Nonverbal   . Sleep apnea    mild - no CPAP    Past Surgical History:  Procedure Laterality Date  . CYST EXCISION Right 03/03/2002   mandible  . MULTIPLE TOOTH EXTRACTIONS  03/03/2002   #1, 16, 17, 30, 32  . TOENAIL EXCISION Bilateral    great toe  . TOOTH EXTRACTION Right 09/03/2014   Procedure: EXTRACTION MOLAR LOWER RIGHT;  Surgeon: Georgia Lopes, DDS;  Location: [facility] OR;  Service: Oral Surgery;  Laterality: Right;  . TOOTH EXTRACTION N/A 03/06/2018    Procedure: EXTRACTION X 1;  Surgeon: Ocie Doyne, DDS;  Location: [facility];  Service: Oral Surgery;  Laterality: N/A;    Family History  Problem Relation Age of Onset  . Hypertension Mother   . Hypertension Father   . Diabetes Father   . Heart disease Maternal Grandfather   . Diabetes Paternal Grandmother   . Seizures Maternal Uncle   . CVA Maternal Grandmother   . Heart disease Paternal Grandfather     Social History Social History   Tobacco Use  . Smoking status: Never Smoker  . Smokeless tobacco: Never Used  Vaping Use  . Vaping Use: Never used  Substance Use Topics  . Alcohol use: No  . Drug use: No    No Known Allergies  Current Outpatient Medications  Medication Sig Dispense Refill  . cephALEXin (KEFLEX) 500 MG capsule Take 1 capsule (500 mg total) by mouth 3 (three) times daily. 21 capsule 0  . furosemide (LASIX) 20 MG tablet Take 1 tablet (20 mg total) by mouth 2 (two) times daily. 60 tablet 1  . KLOR-CON M20 20 MEQ tablet TAKE 1 TABLET BY MOUTH TWICE A DAY 180 tablet 1  . loratadine (CLARITIN) 10 MG tablet TAKE ONE TABLET BY MOUTH ONCE DAILY. 90 tablet 3  . montelukast (SINGULAIR) 10 MG  tablet Take 1 tablet (10 mg total) by mouth at bedtime. 30 tablet 5  . olmesartan-hydrochlorothiazide (BENICAR HCT) 20-12.5 MG tablet TAKE ONE TABLET BY MOUTH ONCE DAILY. 90 tablet 0  . selenium sulfide (SELSUN) 1 % LOTN Wash  Hair daily with 12 cc shampoo for 3 weeks , then twice weekly 420 mL 3  . traZODone (DESYREL) 100 MG tablet TAKE 1 TABLET BY MOUTH AT BEDTIME. 90 tablet 0  . Vitamin D, Ergocalciferol, (DRISDOL) 1.25 MG (50000 UNIT) CAPS capsule Take 1 capsule (50,000 Units total) by mouth every 7 (seven) days. 4 capsule 5   No current facility-administered medications for this visit.      Review of Systems A complete review of systems was asked and was negative except for the following positive findings right lower extremity edema  Blood pressure  135/84, pulse (!) 110, temperature 97.6 F (36.4 C), temperature source Oral, resp. rate 14, height 5\' 9"  (1.753 m), weight (!) 374 lb (169.6 kg), SpO2 96 %.  Physical Exam On physical exam he is a well-appearing gentleman.  There are constitutional evidence of handicap.  He is morbidly obese.  His right lower extremity is obviously larger than his left.  Measurements were not obtained today.  There is a hyperpigmented area in the mid lower extremity.  There is no erythema.  There is no discharge or warmth associated with this area.  There are no palpable pulses in his lower extremities secondary to the marked edema.  Data Reviewed Ultrasound the lower extremities  I have personally reviewed the patient's imaging, laboratory findings and medical records.    Assessment    Unilateral right lower extremity lymphedema    Plan    We will make a referral to our physical therapy department here in [place].  They have several lymphedema specialist who can help assist in management.  A return visit was not made today.      [place], MD 05/18/2020, 9:09 AM

## 2020-05-18 NOTE — Patient Instructions (Signed)
Referral sent to Physical Therapy. Someone from their office will call to schedule an appointment.

## 2020-06-07 ENCOUNTER — Other Ambulatory Visit: Payer: Self-pay

## 2020-06-07 DIAGNOSIS — M7989 Other specified soft tissue disorders: Secondary | ICD-10-CM

## 2020-06-16 ENCOUNTER — Other Ambulatory Visit: Payer: Self-pay

## 2020-06-16 ENCOUNTER — Ambulatory Visit (HOSPITAL_COMMUNITY)
Admission: RE | Admit: 2020-06-16 | Discharge: 2020-06-16 | Disposition: A | Discharge status: Home / Self Care | Payer: Medicare Other | Source: Ambulatory Visit | Attending: Physician Assistant | Admitting: Physician Assistant

## 2020-06-16 ENCOUNTER — Ambulatory Visit (INDEPENDENT_AMBULATORY_CARE_PROVIDER_SITE_OTHER): Payer: Medicare Other | Admitting: Physician Assistant

## 2020-06-16 VITALS — BP 132/83 | HR 110 | Temp 98.6°F | Resp 20 | Ht 69.0 in | Wt 370.8 lb

## 2020-06-16 DIAGNOSIS — I872 Venous insufficiency (chronic) (peripheral): Secondary | ICD-10-CM

## 2020-06-16 DIAGNOSIS — M7989 Other specified soft tissue disorders: Secondary | ICD-10-CM | POA: Insufficient documentation

## 2020-06-16 NOTE — Progress Notes (Signed)
Office Note     CC:  follow up Requesting Provider:  Kerri Perches, MD  HPI: Scott Mendez is a 39 y.o. (Oct 24, 1980) male who presents for evaluation of right lower extremity edema.  [name] is autistic and nonverbal and his mother is with him today.  She states that her son has had swelling of his right lower extremity for a number of years.  He once had a large wound on his anterior shin that healed on its own.  She denies any recurrence of ulceration.  [name] goes to an adult daycare each day of the week and he will soon be starting therapy 3 days a week to improve mobility.  Mother states that he wears knee-high compression whenever she is with her son however when she sends him to adult daycare with his compression he usually comes home with the compression sock down around his ankle.  She states he would not be compliant with elevating his legs during the day as he does not tend to sit still.  She denies any history of DVT, trauma, prior vascular interventions.  He is not a tobacco user.  Past Medical History:  Diagnosis Date  . Autism   . Hypertension    states under control with meds., has been on med. x 8 yr.  . Morbid obesity with BMI of 45.0-49.9, adult (HCC)   . Nonrestorable tooth 02/2018   x 2  . Nonverbal   . Sleep apnea    mild - no CPAP    Past Surgical History:  Procedure Laterality Date  . CYST EXCISION Right 03/03/2002   mandible  . MULTIPLE TOOTH EXTRACTIONS  03/03/2002   #1, 16, 17, 30, 32  . TOENAIL EXCISION Bilateral    great toe  . TOOTH EXTRACTION Right 09/03/2014   Procedure: EXTRACTION MOLAR LOWER RIGHT;  Surgeon: Georgia Lopes, DDS;  Location: [facility] OR;  Service: Oral Surgery;  Laterality: Right;  . TOOTH EXTRACTION N/A 03/06/2018   Procedure: EXTRACTION X 1;  Surgeon: Ocie Doyne, DDS;  Location: [facility];  Service: Oral Surgery;  Laterality: N/A;    Social History   Socioeconomic History  . Marital  status: Single    Spouse name: N/A  . Number of children: 0  . Years of education: Not on file  . Highest education level: Not on file  Occupational History  . Not on file  Tobacco Use  . Smoking status: Never Smoker  . Smokeless tobacco: Never Used  Vaping Use  . Vaping Use: Never used  Substance and Sexual Activity  . Alcohol use: No  . Drug use: No  . Sexual activity: Never  Other Topics Concern  . Not on file  Social History Narrative   Lives with Mother and sister    Social Determinants of Health   Financial Resource Strain:   . Difficulty of Paying Living Expenses: Not on file  Food Insecurity:   . Worried About Programme researcher, broadcasting/film/video in the Last Year: Not on file  . Ran Out of Food in the Last Year: Not on file  Transportation Needs:   . Lack of Transportation (Medical): Not on file  . Lack of Transportation (Non-Medical): Not on file  Physical Activity:   . Days of Exercise per Week: Not on file  . Minutes of Exercise per Session: Not on file  Stress:   . Feeling of Stress : Not on file  Social Connections:   . Frequency  of Communication with Friends and Family: Not on file  . Frequency of Social Gatherings with Friends and Family: Not on file  . Attends Religious Services: Not on file  . Active Member of Clubs or Organizations: Not on file  . Attends Banker Meetings: Not on file  . Marital Status: Not on file  Intimate Partner Violence:   . Fear of Current or Ex-Partner: Not on file  . Emotionally Abused: Not on file  . Physically Abused: Not on file  . Sexually Abused: Not on file    Family History  Problem Relation Age of Onset  . Hypertension Mother   . Hypertension Father   . Diabetes Father   . Heart disease Maternal Grandfather   . Diabetes Paternal Grandmother   . Seizures Maternal Uncle   . CVA Maternal Grandmother   . Heart disease Paternal Grandfather     Current Outpatient Medications  Medication Sig Dispense Refill  .  furosemide (LASIX) 20 MG tablet Take 1 tablet (20 mg total) by mouth 2 (two) times daily. 60 tablet 1  . KLOR-CON M20 20 MEQ tablet TAKE 1 TABLET BY MOUTH TWICE A DAY 180 tablet 1  . loratadine (CLARITIN) 10 MG tablet TAKE ONE TABLET BY MOUTH ONCE DAILY. 90 tablet 3  . montelukast (SINGULAIR) 10 MG tablet Take 1 tablet (10 mg total) by mouth at bedtime. 30 tablet 5  . olmesartan-hydrochlorothiazide (BENICAR HCT) 20-12.5 MG tablet TAKE ONE TABLET BY MOUTH ONCE DAILY. 90 tablet 0  . selenium sulfide (SELSUN) 1 % LOTN Wash  Hair daily with 12 cc shampoo for 3 weeks , then twice weekly 420 mL 3  . traZODone (DESYREL) 100 MG tablet TAKE 1 TABLET BY MOUTH AT BEDTIME. 90 tablet 0  . Vitamin D, Ergocalciferol, (DRISDOL) 1.25 MG (50000 UNIT) CAPS capsule Take 1 capsule (50,000 Units total) by mouth every 7 (seven) days. 4 capsule 5   No current facility-administered medications for this visit.    No Known Allergies   REVIEW OF SYSTEMS:   [X]  denotes positive finding, [ ]  denotes negative finding Cardiac  Comments:  Chest pain or chest pressure:    Shortness of breath upon exertion:    Short of breath when lying flat:    Irregular heart rhythm:        Vascular    Pain in calf, thigh, or hip brought on by ambulation:    Pain in feet at night that wakes you up from your sleep:     Blood clot in your veins:    Leg swelling:         Pulmonary    Oxygen at home:    Productive cough:     Wheezing:         Neurologic    Sudden weakness in arms or legs:     Sudden numbness in arms or legs:     Sudden onset of difficulty speaking or slurred speech:    Temporary loss of vision in one eye:     Problems with dizziness:         Gastrointestinal    Blood in stool:     Vomited blood:         Genitourinary    Burning when urinating:     Blood in urine:        Psychiatric    Major depression:         Hematologic    Bleeding problems:    Problems with blood  clotting too easily:          Skin    Rashes or ulcers:        Constitutional    Fever or chills:      PHYSICAL EXAMINATION:  Vitals:   06/16/20 1433  BP: 132/83  Pulse: (!) 110  Resp: 20  Temp: 98.6 F (37 C)  TempSrc: Temporal  SpO2: 96%  Weight: (!) 370 lb 12.8 oz (168.2 kg)  Height: 5\' 9"  (1.753 m)    General:  WDWN in NAD; vital signs documented above Gait: Not observed HENT: WNL, normocephalic Pulmonary: normal non-labored breathing , without Rales, rhonchi,  wheezing Cardiac: regular HR Abdomen: soft, NT, no masses Skin: without rashes Vascular Exam/Pulses:  Right Left  Radial 2+ (normal) 2+ (normal)  DP 2+ (normal) 2+ (normal)   Extremities: Right lower extremity edematous compared to left with cracking skin and healed wound on anterior shin; no other venous ulcerations or varicose veins noted Musculoskeletal: no muscle wasting or atrophy  Neurologic: A&O X 3;  No focal weakness or paresthesias are detected Psychiatric:  The pt has Normal affect.   Non-Invasive Vascular Imaging:   Right lower extremity reflux study negative for DVT Deep reflux noted in the popliteal vein Superficial reflux noted in the proximal small saphenous vein No greater saphenous vein reflux noted despite diameter greater than 5-1/2 cm    ASSESSMENT/PLAN:: 39 y.o. male here for evaluation of right lower extremity edema  -Venous reflux study is negative for DVT; reflux is noted in the popliteal vein in the deep system and the proximal small saphenous vein of the superficial system; despite a large greater saphenous vein diameter, no reflux was noted -Recommendations included knee-high compression to be worn daily; I suggested to the patient's mother that she should contact the adult daycare to make sure he is wearing his compression throughout the day -Recommendation also included elevation however patient's mother states he would not be able to be compliant with proper leg elevation for any period of time -In  the future if wounds develop we could repeat reflux study to see if he develops any reflux in his great saphenous vein; if reflux were to be noted in his great saphenous vein we could consider the patient for vein stripping in the operating room due to his likely inability to tolerate a laser ablation awake in the office per his mother -Patient will otherwise follow up on an as-needed basis  30, PA-C [facility] [phone]  Clinic MD:   [phone]

## 2020-06-22 ENCOUNTER — Other Ambulatory Visit: Payer: Self-pay | Admitting: Family Medicine

## 2020-06-23 ENCOUNTER — Encounter: Payer: Self-pay | Admitting: Family Medicine

## 2020-06-23 ENCOUNTER — Other Ambulatory Visit: Payer: Self-pay

## 2020-06-23 ENCOUNTER — Ambulatory Visit (INDEPENDENT_AMBULATORY_CARE_PROVIDER_SITE_OTHER): Payer: Medicare Other | Admitting: Family Medicine

## 2020-06-23 VITALS — BP 132/83 | Ht 69.0 in | Wt 371.0 lb

## 2020-06-23 DIAGNOSIS — Z0000 Encounter for general adult medical examination without abnormal findings: Secondary | ICD-10-CM | POA: Diagnosis not present

## 2020-06-23 NOTE — Progress Notes (Addendum)
Subjective:   Scott Mendez is a 39 y.o. male who presents for Medicare Annual/Subsequent preventive examination.  Review of Systems Cardiac Risk Factors include: obesity (BMI >30kg/m2)     Objective:    Today's Vitals   06/23/20 1335 06/23/20 1336  BP: 132/83   Weight: (!) 371 lb (168.3 kg)   Height: 5\' 9"  (1.753 m)   PainSc: 0-No pain 0-No pain   Body mass index is 54.79 kg/m.  Advanced Directives 06/23/2020 07/07/2018 06/19/2018 03/06/2018 02/27/2018 03/13/2017  Does Patient Have a Medical Advance Directive? No No No No Yes Yes  Type of Advance Directive - - - - 05/13/2017  Does patient want to make changes to medical advance directive? - - - No - Patient declined No - Patient declined No - Patient declined  Copy of Healthcare Power of Attorney in Chart? - - - - - No - copy requested  Would patient like information on creating a medical advance directive? No - Patient declined No - Patient declined No - Patient declined No - Patient declined - -    Current Medications (verified) Outpatient Encounter Medications as of 06/23/2020  Medication Sig   furosemide (LASIX) 20 MG tablet Take 1 tablet (20 mg total) by mouth 2 (two) times daily.   KLOR-CON M20 20 MEQ tablet TAKE 1 TABLET BY MOUTH TWICE A DAY   loratadine (CLARITIN) 10 MG tablet TAKE ONE TABLET BY MOUTH ONCE DAILY.   montelukast (SINGULAIR) 10 MG tablet Take 1 tablet (10 mg total) by mouth at bedtime.   olmesartan-hydrochlorothiazide (BENICAR HCT) 20-12.5 MG tablet TAKE ONE TABLET BY MOUTH ONCE DAILY.   selenium sulfide (SELSUN) 1 % LOTN Wash  Hair daily with 12 cc shampoo for 3 weeks , then twice weekly   traZODone (DESYREL) 100 MG tablet TAKE 1 TABLET BY MOUTH AT BEDTIME.   Vitamin D, Ergocalciferol, (DRISDOL) 1.25 MG (50000 UNIT) CAPS capsule Take 1 capsule (50,000 Units total) by mouth every 7 (seven) days.   No facility-administered encounter medications  on file as of 06/23/2020.    Allergies (verified) Patient has no known allergies.   History: Past Medical History:  Diagnosis Date   Autism    Hypertension    states under control with meds., has been on med. x 8 yr.   Morbid obesity with BMI of 45.0-49.9, adult (HCC)    Nonrestorable tooth 02/2018   x 2   Nonverbal    Sleep apnea    mild - no CPAP   Past Surgical History:  Procedure Laterality Date   CYST EXCISION Right 03/03/2002   mandible   MULTIPLE TOOTH EXTRACTIONS  03/03/2002   #1, 16, 17, 30, 32   TOENAIL EXCISION Bilateral    great toe   TOOTH EXTRACTION Right 09/03/2014   Procedure: EXTRACTION MOLAR LOWER RIGHT;  Surgeon: 09/05/2014, DDS;  Location: [facility] OR;  Service: Oral Surgery;  Laterality: Right;   TOOTH EXTRACTION N/A 03/06/2018   Procedure: EXTRACTION X 1;  Surgeon: 05/06/2018, DDS;  Location: [facility];  Service: Oral Surgery;  Laterality: N/A;   Family History  Problem Relation Age of Onset   Hypertension Mother    Hypertension Father    Diabetes Father    Heart disease Maternal Grandfather    Diabetes Paternal Grandmother    Seizures Maternal Uncle    CVA Maternal Grandmother    Heart disease Paternal Grandfather    Social History  Socioeconomic History   Marital status: Single    Spouse name: N/A   Number of children: 0   Years of education: Not on file   Highest education level: Not on file  Occupational History   Not on file  Tobacco Use   Smoking status: Never Smoker   Smokeless tobacco: Never Used  Vaping Use   Vaping Use: Never used  Substance and Sexual Activity   Alcohol use: No   Drug use: No   Sexual activity: Never  Other Topics Concern   Not on file  Social History Narrative   Lives with Mother and sister    Social Determinants of Health   Financial Resource Strain: Low Risk    Difficulty of Paying Living Expenses: Not hard at all  Food Insecurity: No Food  Insecurity   Worried About Programme researcher, broadcasting/film/videounning Out of Food in the Last Year: Never true   Ran Out of Food in the Last Year: Never true  Transportation Needs: No Transportation Needs   Lack of Transportation (Medical): No   Lack of Transportation (Non-Medical): No  Physical Activity: Insufficiently Active   Days of Exercise per Week: 4 days   Minutes of Exercise per Session: 30 min  Stress: No Stress Concern Present   Feeling of Stress : Not at all  Social Connections: Socially Isolated   Frequency of Communication with Friends and Family: Never   Frequency of Social Gatherings with Friends and Family: Twice a week   Attends Religious Services: More than 4 times per year   Active Member of [employer]ubs or Organizations: No   Attends Engineer, structuralClub or Organization Meetings: Never   Marital Status: Never married    Tobacco Counseling Counseling given: Yes   Clinical Intake:  Pre-visit preparation completed: Yes  Pain : No/denies pain Pain Score: 0-No pain     BMI - recorded: 54.76 Nutritional Status: BMI > 30  Obese Nutritional Risks: None Diabetes: No  How often do you need to have someone help you when you read instructions, pamphlets, or other written materials from your doctor or pharmacy?: 5 - Always What is the last grade level you completed in school?: 12  Diabetic? no  Interpreter Needed?: No  Information entered by :: Jerilynn MagesBrook Myrtle Barnhard, LPN   Activities of Daily Living In your present state of health, do you have any difficulty performing the following activities: 06/23/2020  Hearing? N  Vision? N  Difficulty concentrating or making decisions? N  Walking or climbing stairs? N  Dressing or bathing? Y  Doing errands, shopping? Y  Preparing Food and eating ? Y  Using the Toilet? Y  In the past six months, have you accidently leaked urine? Y  Do you have problems with loss of bowel control? Y  Managing your Medications? Y  Managing your Finances? Y  Housekeeping or managing  your Housekeeping? Y  Some recent data might be hidden    Patient Care Team: Kerri PerchesSimpson, Margaret E, MD as PCP - General Mike CrazeWalker, Edwin O, MD (Inactive) as Consulting Physician (Psychiatry)  Indicate any recent Medical Services you may have received from other than [facility] providers in the past year (date may be approximate).     Assessment:   This is a routine wellness examination for Mathias.  Hearing/Vision screen No exam data present  Dietary issues and exercise activities discussed: Current Exercise Habits: Structured exercise class, Type of exercise: Other - see comments ([place] Time (Minutes): 30, Frequency (Times/Week): 5, Weekly Exercise (Minutes/Week):  150, Intensity: Mild  Goals     Exercise 3x per week (30 min per time)     Recommend starting a routine exercise program at least 3 days a week for 30-45 minutes at a time as tolerated.       Weight (lb) < 200 lb (90.7 kg)      Depression Screen PHQ 2/9 Scores 06/23/2020 06/23/2020 05/11/2020 12/02/2019 06/23/2019 10/22/2018 07/15/2018  PHQ - 2 Score 0 0 0 0 0 1 1  PHQ- 9 Score - - - 0 - - 2  Exception Documentation - - - - - - -    Fall Risk Fall Risk  06/23/2020 05/18/2020 05/11/2020 12/02/2019 06/23/2019  Falls in the past year? 0 0 0 0 0  Number falls in past yr: 0 - 0 0 0  Injury with Fall? 0 - 0 0 0  Risk for fall due to : No Fall Risks - - No Fall Risks -  Follow up Falls evaluation completed - Falls evaluation completed Falls evaluation completed -    Any stairs in or around the home? No  If so, are there any without handrails? No  Home free of loose throw rugs in walkways, pet beds, electrical cords, etc? Yes  Adequate lighting in your home to reduce risk of falls? Yes   ASSISTIVE DEVICES UTILIZED TO PREVENT FALLS:  Life alert? Yes  Use of a cane, walker or w/c? No  Grab bars in the bathroom? Yes  Shower chair or bench in shower? No  Elevated toilet seat or a handicapped toilet?  No   TIMED UP AND GO:  Was the test performed? No .  Length of time to ambulate n/a   Cognitive Function: MMSE - Mini Mental State Exam 03/13/2017  Not completed: Unable to complete        Immunizations Immunization History  Administered Date(s) Administered   H1N1 06/03/2008   Influenza Split 04/22/2012   Influenza Whole 05/07/2007, 05/02/2009, 04/17/2010   Influenza,inj,Quad PF,6+ Mos 05/18/2013, 07/20/2014, 04/04/2015, 04/04/2016, 04/10/2017, 05/05/2018, 04/20/2019, 04/13/2020   Moderna SARS-COVID-2 Vaccination 12/07/2019, 01/06/2020   Td 11/23/2002   Tdap 12/19/2011    TDAP status: Up to date Flu Vaccine status: Completed at today's visit Pneumococcal vaccine status: Declined,  Education has been provided regarding the importance of this vaccine but patient still declined. Advised may receive this vaccine at local pharmacy or Health Dept. Aware to provide a copy of the vaccination record if obtained from local pharmacy or Health Dept. Verbalized acceptance and understanding.  Covid-19 vaccine status: Completed vaccines  Qualifies for Shingles Vaccine? No   Zostavax completed No   Shingrix Completed?: No.    Education has been provided regarding the importance of this vaccine. Patient has been advised to call insurance company to determine out of pocket expense if they have not yet received this vaccine. Advised may also receive vaccine at local pharmacy or Health Dept. Verbalized acceptance and understanding.  Screening Tests Health Maintenance  Topic Date Due   Hepatitis C Screening  Never done   TETANUS/TDAP  12/18/2021   INFLUENZA VACCINE  Completed   COVID-19 Vaccine  Completed   HIV Screening  Completed    Health Maintenance  Health Maintenance Due  Topic Date Due   Hepatitis C Screening  Never done    Colorectal Screening: n/a  Lung Cancer Screening: (Low Dose CT Chest recommended if Age 68-80 years, 30 pack-year currently smoking OR have  quit w/in 15years.) does not qualify.   Lung  Cancer Screening Referral: n/a  Additional Screening:  Hepatitis C Screening: does not qualify  Vision Screening: Recommended annual ophthalmology exams for early detection of glaucoma and other disorders of the eye. Is the patient up to date with their annual eye exam?  No  Who is the provider or what is the name of the office in which the patient attends annual eye exams? n/a If pt is not established with a provider, would they like to be referred to a provider to establish care? Yes .   Dental Screening: Recommended annual dental exams for proper oral hygiene  Community Resource Referral / Chronic Care Management: CRR required this visit?  No   CCM required this visit?  No      Plan:     1. Encounter for Medicare annual wellness exam   I have personally reviewed and noted the following in the patients chart:    Medical and social history  Use of alcohol, tobacco or illicit drugs   Current medications and supplements  Functional ability and status  Nutritional status  Physical activity  Advanced directives  List of other physicians  Hospitalizations, surgeries, and ER visits in previous 12 months  Vitals  Screenings to include cognitive, depression, and falls  Referrals and appointments  In addition, I have reviewed and discussed with patient certain preventive protocols, quality metrics, and best practice recommendations. A written personalized care plan for preventive services as well as general preventive health recommendations were provided to patient.     Freddy Finner, NP   06/23/2020   Nurse Notes: AWV conducted in the office over the phone with patients mothers consent to telehealth visit via audio. Patient is non verbal. Provider in office. Visit took 30 minutes to complete.

## 2020-06-23 NOTE — Patient Instructions (Addendum)
[name] , Thank you for taking time to come for your Medicare Wellness Visit. I appreciate your ongoing commitment to your health goals. Please review the following plan we discussed and let me know if I can assist you in the future.   Screening recommendations/referrals: Colonoscopy: Not due Recommended yearly ophthalmology/optometry visit for glaucoma screening and checkup Recommended yearly dental visit for hygiene and checkup  Vaccinations: Influenza vaccine: Fall 2022 Pneumococcal vaccine: Not due Tdap vaccine: 12/18/21 Shingles vaccine: Not due  Advanced directives: No  Conditions/risks identified: None  Next appointment: 08/23/20 @ 10:20 am  Preventive Care 40-64 Years, Male Preventive care refers to lifestyle choices and visits with your health care provider that can promote health and wellness. What does preventive care include?  A yearly physical exam. This is also called an annual well check.  Dental exams once or twice a year.  Routine eye exams. Ask your health care provider how often you should have your eyes checked.  Personal lifestyle choices, including:  Daily care of your teeth and gums.  Regular physical activity.  Eating a healthy diet.  Avoiding tobacco and drug use.  Limiting alcohol use.  Practicing safe sex.  Taking low-dose aspirin every day starting at age 1. What happens during an annual well check? The services and screenings done by your health care provider during your annual well check will depend on your age, overall health, lifestyle risk factors, and family history of disease. Counseling  Your health care provider may ask you questions about your:  Alcohol use.  Tobacco use.  Drug use.  Emotional well-being.  Home and relationship well-being.  Sexual activity.  Eating habits.  Work and work Astronomer. Screening  You may have the following tests or measurements:  Height, weight, and BMI.  Blood  pressure.  Lipid and cholesterol levels. These may be checked every 5 years, or more frequently if you are over 67 years old.  Skin check.  Lung cancer screening. You may have this screening every year starting at age 20 if you have a 30-pack-year history of smoking and currently smoke or have quit within the past 15 years.  Fecal occult blood test (FOBT) of the stool. You may have this test every year starting at age 46.  Flexible sigmoidoscopy or colonoscopy. You may have a sigmoidoscopy every 5 years or a colonoscopy every 10 years starting at age 8.  Prostate cancer screening. Recommendations will vary depending on your family history and other risks.  Hepatitis C blood test.  Hepatitis B blood test.  Sexually transmitted disease (STD) testing.  Diabetes screening. This is done by checking your blood sugar (glucose) after you have not eaten for a while (fasting). You may have this done every 1-3 years. Discuss your test results, treatment options, and if necessary, the need for more tests with your health care provider. Vaccines  Your health care provider may recommend certain vaccines, such as:  Influenza vaccine. This is recommended every year.  Tetanus, diphtheria, and acellular pertussis (Tdap, Td) vaccine. You may need a Td booster every 10 years.  Zoster vaccine. You may need this after age 39.  Pneumococcal 13-valent conjugate (PCV13) vaccine. You may need this if you have certain conditions and have not been vaccinated.  Pneumococcal polysaccharide (PPSV23) vaccine. You may need one or two doses if you smoke cigarettes or if you have certain conditions. Talk to your health care provider about which screenings and vaccines you need and how often you need them. This  information is not intended to replace advice given to you by your health care provider. Make sure you discuss any questions you have with your health care provider. Document Released: 08/19/2015 Document  Revised: 04/11/2016 Document Reviewed: 05/24/2015 Elsevier Interactive Patient Education  2017 [place] Prevention in the Home Falls can cause injuries. They can happen to people of all ages. There are many things you can do to make your home safe and to help prevent falls. What can I do on the outside of my home?  Regularly fix the edges of walkways and driveways and fix any cracks.  Remove anything that might make you trip as you walk through a door, such as a raised step or threshold.  Trim any bushes or trees on the path to your home.  Use bright outdoor lighting.  Clear any walking paths of anything that might make someone trip, such as rocks or tools.  Regularly check to see if handrails are loose or broken. Make sure that both sides of any steps have handrails.  Any raised decks and porches should have guardrails on the edges.  Have any leaves, snow, or ice cleared regularly.  Use sand or salt on walking paths during winter.  Clean up any spills in your garage right away. This includes oil or grease spills. What can I do in the bathroom?  Use night lights.  Install grab bars by the toilet and in the tub and shower. Do not use towel bars as grab bars.  Use non-skid mats or decals in the tub or shower.  If you need to sit down in the shower, use a plastic, non-slip stool.  Keep the floor dry. Clean up any water that spills on the floor as soon as it happens.  Remove soap buildup in the tub or shower regularly.  Attach bath mats securely with double-sided non-slip rug tape.  Do not have throw rugs and other things on the floor that can make you trip. What can I do in the bedroom?  Use night lights.  Make sure that you have a light by your bed that is easy to reach.  Do not use any sheets or blankets that are too big for your bed. They should not hang down onto the floor.  Have a firm chair that has side arms. You can use this for support while you  get dressed.  Do not have throw rugs and other things on the floor that can make you trip. What can I do in the kitchen?  Clean up any spills right away.  Avoid walking on wet floors.  Keep items that you use a lot in easy-to-reach places.  If you need to reach something above you, use a strong step stool that has a grab bar.  Keep electrical cords out of the way.  Do not use floor polish or wax that makes floors slippery. If you must use wax, use non-skid floor wax.  Do not have throw rugs and other things on the floor that can make you trip. What can I do with my stairs?  Do not leave any items on the stairs.  Make sure that there are handrails on both sides of the stairs and use them. Fix handrails that are broken or loose. Make sure that handrails are as long as the stairways.  Check any carpeting to make sure that it is firmly attached to the stairs. Fix any carpet that is loose or worn.  Avoid having throw  rugs at the top or bottom of the stairs. If you do have throw rugs, attach them to the floor with carpet tape.  Make sure that you have a light switch at the top of the stairs and the bottom of the stairs. If you do not have them, ask someone to add them for you. What else can I do to help prevent falls?  Wear shoes that:  Do not have high heels.  Have rubber bottoms.  Are comfortable and fit you well.  Are closed at the toe. Do not wear sandals.  If you use a stepladder:  Make sure that it is fully opened. Do not climb a closed stepladder.  Make sure that both sides of the stepladder are locked into place.  Ask someone to hold it for you, if possible.  Clearly mark and make sure that you can see:  Any grab bars or handrails.  First and last steps.  Where the edge of each step is.  Use tools that help you move around (mobility aids) if they are needed. These include:  Canes.  Walkers.  Scooters.  Crutches.  Turn on the lights when you go into  a dark area. Replace any light bulbs as soon as they burn out.  Set up your furniture so you have a clear path. Avoid moving your furniture around.  If any of your floors are uneven, fix them.  If there are any pets around you, be aware of where they are.  Review your medicines with your doctor. Some medicines can make you feel dizzy. This can increase your chance of falling. Ask your doctor what other things that you can do to help prevent falls. This information is not intended to replace advice given to you by your health care provider. Make sure you discuss any questions you have with your health care provider. Document Released: 05/19/2009 Document Revised: 12/29/2015 Document Reviewed: 08/27/2014 Elsevier Interactive Patient Education  2017 [employer].

## 2020-07-07 ENCOUNTER — Ambulatory Visit (HOSPITAL_COMMUNITY): Payer: Medicare Other | Attending: Cardiothoracic Surgery | Admitting: Physical Therapy

## 2020-07-07 ENCOUNTER — Other Ambulatory Visit: Payer: Self-pay

## 2020-07-07 DIAGNOSIS — I890 Lymphedema, not elsewhere classified: Secondary | ICD-10-CM | POA: Insufficient documentation

## 2020-07-07 NOTE — Therapy (Signed)
[facility] [address] [place], [place], [address] Phone: [phone]   Fax:  [phone]  Physical Therapy Evaluation  Patient Details  Name: Scott Mendez MRN: [id number] Date of Birth: [birth year] No data recorded  Encounter Date: 07/07/2020   PT End of Session - 07/07/20 1102    Visit Number 1    Number of Visits 18    Date for PT Re-Evaluation 08/18/20    Authorization Type medicare/caid    Authorization - Visit Number 1    Authorization - Number of Visits 18    Progress Note Due on Visit 10    PT Start Time 1050    PT Stop Time 1130    PT Time Calculation (min) 40 min    Activity Tolerance Patient tolerated treatment well    Behavior During Therapy Restless           Past Medical History:  Diagnosis Date  . Autism   . Hypertension    states under control with meds., has been on med. x 8 yr.  . Morbid obesity with BMI of 45.0-49.9, adult (HCC)   . Nonrestorable tooth 02/2018   x 2  . Nonverbal   . Sleep apnea    mild - no CPAP    Past Surgical History:  Procedure Laterality Date  . CYST EXCISION Right 03/03/2002   mandible  . MULTIPLE TOOTH EXTRACTIONS  03/03/2002   #1, 16, 17, 30, 32  . TOENAIL EXCISION Bilateral    great toe  . TOOTH EXTRACTION Right 09/03/2014   Procedure: EXTRACTION MOLAR LOWER RIGHT;  Surgeon: Georgia LopesScott M Mendez, DDS;  Location: [facility] OR;  Service: Oral Surgery;  Laterality: Right;  . TOOTH EXTRACTION N/A 03/06/2018   Procedure: EXTRACTION X 1;  Surgeon: Scott Mendez, Scott, DDS;  Location: [facility];  Service: Oral Surgery;  Laterality: N/A;    There were no vitals filed for this visit.    Subjective Assessment - 07/07/20 1054    Subjective PT is accompanied by his mother, his mother states that the pt does not talk.  She states that the swelling started several years ago and is progressively getting worse, there is now a dark place on his leg and it looks like it is leaking  fluid. PT mother states that he fools with his leg all the time, rubbing it so she feels like it must be bothering him.    Pertinent History autism; morbid obesity.    Currently in Pain? --   no facial pain             [facility] PT Assessment - 07/07/20 0001      Observation/Other Assessments   Observations area anterior Rt LE noted darkening of color with dried flaky substance most likely from weeping in the past although pt is not exhibiting any weeping at this time.              LYMPHEDEMA/ONCOLOGY QUESTIONNAIRE - 07/07/20 0001      What other symptoms do you have   Are you Having Heaviness or Tightness No    Are you having Pain Yes    Are you having pitting edema Yes    Body Site Rt LE     Is it Hard or Difficult finding clothes that fit Yes    Do you have infections No      Lymphedema Stage   Stage STAGE 2 SPONTANEOUSLY IRREVERSIBLE      Lymphedema Assessments  Lymphedema Assessments Lower extremities      Right Lower Extremity Lymphedema   20 cm Proximal to Suprapatella 85 cm    10 cm Proximal to Suprapatella 71.6 cm    At Midpatella/Popliteal Crease 56.5 cm    30 cm Proximal to Floor at Lateral Plantar Foot 53.3 cm    20 cm Proximal to Floor at Lateral Plantar Foot 47.8 1    10  cm Proximal to Floor at Lateral Malleoli 40 cm    Circumference of ankle/heel 46.5 cm.    5 cm Proximal to 1st MTP Joint 34.2 cm    Across MTP Joint 32 cm    Around Proximal Great Toe 12 cm      Left Lower Extremity Lymphedema   20 cm Proximal to Suprapatella 81.5 cm    10 cm Proximal to Suprapatella 69 cm    At Midpatella/Popliteal Crease 55 cm    30 cm Proximal to Floor at Lateral Plantar Foot 43.5 cm    20 cm Proximal to Floor at Lateral Plantar Foot 36.5 cm    10 cm Proximal to Floor at Lateral Malleoli 31 cm    Circumference of ankle/heel 42.7 cm.    5 cm Proximal to 1st MTP Joint 30.8 cm    Across MTP Joint 29.4 cm    Around Proximal Great Toe 12.2 cm                    Objective measurements completed on examination: See above findings.       [facility] Adult PT Treatment/Exercise - 07/07/20 0001      Exercises   Exercises Knee/Hip      Knee/Hip Exercises: Seated   Long Arc Quad Both;5 reps    Other Seated Knee/Hip Exercises ankle pumps     Marching Both;5 reps    Abduction/Adduction  Both;5 reps                  PT Education - 07/07/20 1102    Education Details HEP    Person(s) Educated Parent(s)    Methods Explanation    Comprehension Verbalized understanding            PT Short Term Goals - 07/07/20 1337      PT SHORT TERM GOAL #1   Title PT Rt LE measurements at 30/20/and 10 cm to decrease from 3-4 cm to reduce risk of cellulitis.    Time 3    Period Weeks    Status New    Target Date 07/28/20      PT SHORT TERM GOAL #2   Title PT mother to state that they are completing the LE exercises at home at least 3x/wk to increase lymphatic circulation    Time 3    Period Weeks    Status New             PT Long Term Goals - 07/07/20 1338      PT LONG TERM GOAL #1   Title PT Rt LE at 30,20 and 10 cm measurements to be decreased  by 4-6 cm to reduce risk of cellulitis.    Time 6    Period Weeks    Status New    Target Date 08/18/20      PT LONG TERM GOAL #2   Title PT to have compresion garment and mother to be able to don    Time 6    Period Weeks    Status New  PT LONG TERM GOAL #3   Title PT to have and be using a compression pump    Time 6    Period Weeks    Status New      PT LONG TERM GOAL #4   Title PT Rt anterior leg to have good skin integrity.    Time 6    Period Weeks    Status New                  Plan - 07/07/20 1120    Clinical Impression Statement [name] is a 39 yo autistic man who is accompanied by his mother.  He is non-verbal.  His mother states that his Rt leg has been progressively swelling for the past couple of years and it has gotten to the  point where it weeps at times.  She has ordered compression socks out of magazines but they just seem to tight, she went to a cardiovascular surgeon who cleared the pt of any circulatory issues and has now referred him to skilled physical therapy for lymphedema treatment. Evaluation shows pt will follow commands 50-75% of the time.  The pt's mother is very involved in the patients care.  Mr. Abdulaziz has swelling in B his LE with the Rt greater than the left.  Mr. Rybarczyk will benefit from skilled PT for total decongestive techniques to decrease the edema in his legs to reduce the risk of cellulitis and to decrease his pain as well as assistance obtaining compression garment and pump to keep pt LE decongested.    Personal Factors and Comorbidities Comorbidity 1;Finances;Time since onset of injury/illness/exacerbation    Examination-Activity Limitations Hygiene/Grooming;Other    Stability/Clinical Decision Making Evolving/Moderate complexity    Clinical Decision Making Moderate    Rehab Potential Good    PT Frequency 3x / week    PT Duration 6 weeks    PT Treatment/Interventions Manual techniques;Manual lymph drainage    PT Next Visit Plan cut foam for LE with compression bandaging to thigh level B ; begin manual decongestive techniques as time will allow.  Speak with mother about options juxtafit/ garments therapist feels juxtafit will most likely be most practical in this instance but will want mother to concur.    PT Home Exercise Plan ankle pump, LAQ, hip ab/adductin, marching- pt does 50% of the time.  Therapist did not attempt diaphragm breathing as she does not believe the patient will be able to understand the concept.           Patient will benefit from skilled therapeutic intervention in order to improve the following deficits and impairments:  Pain, Obesity, Increased edema, Decreased skin integrity  Visit Diagnosis: Lymphedema, not elsewhere classified     Problem  List Patient Active Problem List   Diagnosis Date Noted  . Dandruff in adult 10/11/2018  . Cellulitis of right leg 08/19/2018  . Right leg swelling 07/15/2018  . Dental caries 02/28/2018  . 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 Snoring 09/09/2017  . Insomnia 12/01/2014  . Autism 12/22/2012  . IMPAIRED GLUCOSE TOLERANCE 08/17/2010  . Impaired fasting glucose 08/17/2010  . Seasonal allergies 11/18/2009  . Essential hypertension 10/11/2009  . Morbid obesity (HCC) 10/31/2007  . MENTAL RETARDATION 10/31/2007    Virgina Organ, PT CLT [phone] 07/07/2020, 1:51 PM  [facility] [address] [place], [place], [address] Phone: [phone]   Fax:  [phone]  Name: Scott Mendez MRN: [id number] Date of Birth: [birth year]

## 2020-07-11 ENCOUNTER — Other Ambulatory Visit: Payer: Self-pay

## 2020-07-11 ENCOUNTER — Ambulatory Visit (HOSPITAL_COMMUNITY): Payer: Medicare Other | Admitting: Physical Therapy

## 2020-07-11 ENCOUNTER — Encounter (HOSPITAL_COMMUNITY): Payer: Self-pay | Admitting: Physical Therapy

## 2020-07-11 DIAGNOSIS — I890 Lymphedema, not elsewhere classified: Secondary | ICD-10-CM | POA: Diagnosis not present

## 2020-07-11 NOTE — Therapy (Signed)
[facility] [address] [place], [place], [address] Phone: [phone]   Fax:  [phone]  Physical Therapy Treatment  Patient Details  Name: Scott Mendez MRN: [id number] Date of Birth: 06-20-1981 No data recorded  Encounter Date: 07/11/2020   PT End of Session - 07/11/20 1701    Visit Number 2    Number of Visits 18    Date for PT Re-Evaluation 08/18/20    Authorization Type medicare/caid    Authorization - Visit Number 2    Authorization - Number of Visits 18    Progress Note Due on Visit 10    PT Start Time 1530    PT Stop Time 1630    PT Time Calculation (min) 60 min    Activity Tolerance Patient tolerated treatment well    Behavior During Therapy Restless           Past Medical History:  Diagnosis Date  . Autism   . Hypertension    states under control with meds., has been on med. x 8 yr.  . Morbid obesity with BMI of 45.0-49.9, adult (HCC)   . Nonrestorable tooth 02/2018   x 2  . Nonverbal   . Sleep apnea    mild - no CPAP    Past Surgical History:  Procedure Laterality Date  . CYST EXCISION Right 03/03/2002   mandible  . MULTIPLE TOOTH EXTRACTIONS  03/03/2002   #1, 16, 17, 30, 32  . TOENAIL EXCISION Bilateral    great toe  . TOOTH EXTRACTION Right 09/03/2014   Procedure: EXTRACTION MOLAR LOWER RIGHT;  Surgeon: Georgia Lopes, DDS;  Location: [facility] OR;  Service: Oral Surgery;  Laterality: Right;  . TOOTH EXTRACTION N/A 03/06/2018   Procedure: EXTRACTION X 1;  Surgeon: Ocie Doyne, DDS;  Location: [facility];  Service: Oral Surgery;  Laterality: N/A;    There were no vitals filed for this visit.   Subjective Assessment - 07/11/20 1659    Subjective PT continues to be nonverbal.  Mother shown both juxtafit and compression garment.  States she has had juxtafit in the past for her son but he takes them off therefore she wants to try compression garments at D/C    Pertinent History autism;  morbid obesity.                   [facility] Adult PT Treatment/Exercise - 07/11/20 0001      Exercises   Exercises Knee/Hip      Knee/Hip Exercises: Seated   Long Arc Quad --    Other Seated Knee/Hip Exercises --    Marching --    Abduction/Adduction  Both;5 reps      Manual Therapy   Manual Therapy Compression Bandaging    Manual therapy comments Foam cut  for LE and thigh of Rt LE     Compression Bandaging toe to thigh of RT LE using foam and multiple layer of compression bandaging                     PT Short Term Goals - 07/07/20 1337      PT SHORT TERM GOAL #1   Title PT Rt LE measurements at 30/20/and 10 cm to decrease from 3-4 cm to reduce risk of cellulitis.    Time 3    Period Weeks    Status New    Target Date 07/28/20      PT SHORT TERM GOAL #2  Title PT mother to state that they are completing the LE exercises at home at least 3x/wk to increase lymphatic circulation    Time 3    Period Weeks    Status New             PT Long Term Goals - 07/07/20 1338      PT LONG TERM GOAL #1   Title PT Rt LE at 30,20 and 10 cm measurements to be decreased  by 4-6 cm to reduce risk of cellulitis.    Time 6    Period Weeks    Status New    Target Date 08/18/20      PT LONG TERM GOAL #2   Title PT to have compresion garment and mother to be able to don    Time 6    Period Weeks    Status New      PT LONG TERM GOAL #3   Title PT to have and be using a compression pump    Time 6    Period Weeks    Status New      PT LONG TERM GOAL #4   Title PT Rt anterior leg to have good skin integrity.    Time 6    Period Weeks    Status New                 Plan - 07/11/20 1702    Clinical Impression Statement Pt unable to have manual due to time restraints.  Foam was cut for Rt LE followed by bandaging.  Mother was educated to keep bandages on until Wed around 12:00 at which time take bandages off roll them, have pt bath and come back to  treatment.    Personal Factors and Comorbidities Comorbidity 1;Finances;Time since onset of injury/illness/exacerbation    Examination-Activity Limitations Hygiene/Grooming;Other    Stability/Clinical Decision Making Evolving/Moderate complexity    Rehab Potential Good    PT Frequency 3x / week    PT Duration 6 weeks    PT Treatment/Interventions Manual techniques;Manual lymph drainage    PT Next Visit Plan ; begin manual decongestive techniques .    PT Home Exercise Plan ankle pump, LAQ, hip ab/adductin, marching- pt does 50% of the time.  Therapist did not attempt diaphragm breathing as she does not believe the patient will be able to understand the concept.           Patient will benefit from skilled therapeutic intervention in order to improve the following deficits and impairments:  Pain, Obesity, Increased edema, Decreased skin integrity  Visit Diagnosis: Lymphedema, not elsewhere classified     Problem List Patient Active Problem List   Diagnosis Date Noted  . Dandruff in adult 10/11/2018  . Cellulitis of right leg 08/19/2018  . Right leg swelling 07/15/2018  . Dental caries 02/28/2018  . 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 Snoring 09/09/2017  . Insomnia 12/01/2014  . Autism 12/22/2012  . IMPAIRED GLUCOSE TOLERANCE 08/17/2010  . Impaired fasting glucose 08/17/2010  . Seasonal allergies 11/18/2009  . Essential hypertension 10/11/2009  . Morbid obesity (HCC) 10/31/2007  . MENTAL RETARDATION 10/31/2007   Virgina Organ, PT CLT [phone] 07/11/2020, 5:06 PM  [facility] [address] [place], [place], [address] Phone: [phone]   Fax:  [phone]  Name: Scott Mendez MRN: [id number] Date of Birth: October 01, 1980

## 2020-07-13 ENCOUNTER — Other Ambulatory Visit: Payer: Self-pay

## 2020-07-13 ENCOUNTER — Ambulatory Visit (HOSPITAL_COMMUNITY): Payer: Medicare Other

## 2020-07-13 ENCOUNTER — Encounter (HOSPITAL_COMMUNITY): Payer: Self-pay

## 2020-07-13 DIAGNOSIS — I890 Lymphedema, not elsewhere classified: Secondary | ICD-10-CM

## 2020-07-13 NOTE — Therapy (Signed)
[facility] [address] [place], [place], [address] Phone: [phone]   Fax:  [phone]  Physical Therapy Treatment  Patient Details  Name: Scott Mendez MRN: [id number] Date of Birth: 1981/07/01 No data recorded  Encounter Date: 07/13/2020   PT End of Session - 07/13/20 1636    Visit Number 3    Number of Visits 18    Date for PT Re-Evaluation 08/18/20    Authorization Type medicare/caid    Authorization - Visit Number 3    Authorization - Number of Visits 18    Progress Note Due on Visit 10    PT Start Time 1533    PT Stop Time 1640    PT Time Calculation (min) 67 min    Activity Tolerance Patient tolerated treatment well    Behavior During Therapy Restless           Past Medical History:  Diagnosis Date  . Autism   . Hypertension    states under control with meds., has been on med. x 8 yr.  . Morbid obesity with BMI of 45.0-49.9, adult (HCC)   . Nonrestorable tooth 02/2018   x 2  . Nonverbal   . Sleep apnea    mild - no CPAP    Past Surgical History:  Procedure Laterality Date  . CYST EXCISION Right 03/03/2002   mandible  . MULTIPLE TOOTH EXTRACTIONS  03/03/2002   #1, 16, 17, 30, 32  . TOENAIL EXCISION Bilateral    great toe  . TOOTH EXTRACTION Right 09/03/2014   Procedure: EXTRACTION MOLAR LOWER RIGHT;  Surgeon: Georgia Lopes, DDS;  [facility];  Service: Oral Surgery;  Laterality: Right;  . TOOTH EXTRACTION N/A 03/06/2018   Procedure: EXTRACTION X 1;  Surgeon: Ocie Doyne, DDS;  Location: [facility];  Service: Oral Surgery;  Laterality: N/A;    There were no vitals filed for this visit.   Subjective Assessment - 07/13/20 1531    Subjective Mother reports son removed thigh dressings and trashed the dressings and foam.  Arrived with knee high bandages rolled up    Pertinent History autism; morbid obesity.    Currently in Pain? No/denies   non-verbal, no facial pain expressions                             [facility] Adult PT Treatment/Exercise - 07/13/20 0001      Manual Therapy   Manual Therapy Compression Bandaging    Manual therapy comments Foam cut for Rt LE    Compression Bandaging toe to thigh of RT LE using foam and multi layer short stretch bandages                    PT Short Term Goals - 07/07/20 1337      PT SHORT TERM GOAL #1   Title PT Rt LE measurements at 30/20/and 10 cm to decrease from 3-4 cm to reduce risk of cellulitis.    Time 3    Period Weeks    Status New    Target Date 07/28/20      PT SHORT TERM GOAL #2   Title PT mother to state that they are completing the LE exercises at home at least 3x/wk to increase lymphatic circulation    Time 3    Period Weeks    Status New  PT Long Term Goals - 07/07/20 1338      PT LONG TERM GOAL #1   Title PT Rt LE at 30,20 and 10 cm measurements to be decreased  by 4-6 cm to reduce risk of cellulitis.    Time 6    Period Weeks    Status New    Target Date 08/18/20      PT LONG TERM GOAL #2   Title PT to have compresion garment and mother to be able to don    Time 6    Period Weeks    Status New      PT LONG TERM GOAL #3   Title PT to have and be using a compression pump    Time 6    Period Weeks    Status New      PT LONG TERM GOAL #4   Title PT Rt anterior leg to have good skin integrity.    Time 6    Period Weeks    Status New                 Plan - 07/13/20 1637    Clinical Impression Statement Mother reports son removed proximal dressings and foam.  Recut 1/2in foam for Rt LE and applied multilayer short stretch bandages.  Strongly encouraged pt and mother to keep dressings on until next apt, keep foam.    Personal Factors and Comorbidities Comorbidity 1;Finances;Time since onset of injury/illness/exacerbation    Examination-Activity Limitations Hygiene/Grooming;Other    Stability/Clinical Decision Making Evolving/Moderate  complexity    Clinical Decision Making Moderate    Rehab Potential Good    PT Frequency 3x / week    PT Duration 6 weeks    PT Treatment/Interventions Manual techniques;Manual lymph drainage    PT Next Visit Plan ; begin manual decongestive techniques .    PT Home Exercise Plan ankle pump, LAQ, hip ab/adductin, marching- pt does 50% of the time.  Therapist did not attempt diaphragm breathing as she does not believe the patient will be able to understand the concept.           Patient will benefit from skilled therapeutic intervention in order to improve the following deficits and impairments:  Pain, Obesity, Increased edema, Decreased skin integrity  Visit Diagnosis: Lymphedema, not elsewhere classified     Problem List Patient Active Problem List   Diagnosis Date Noted  . Dandruff in adult 10/11/2018  . Cellulitis of right leg 08/19/2018  . Right leg swelling 07/15/2018  . Dental caries 02/28/2018  . 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 Snoring 09/09/2017  . Insomnia 12/01/2014  . Autism 12/22/2012  . IMPAIRED GLUCOSE TOLERANCE 08/17/2010  . Impaired fasting glucose 08/17/2010  . Seasonal allergies 11/18/2009  . Essential hypertension 10/11/2009  . Morbid obesity (HCC) 10/31/2007  . MENTAL RETARDATION 10/31/2007   Becky Sax, LPTA/CLT; CBIS [phone]  Juel Burrow 07/13/2020, 4:42 PM  [facility] [address] [place], [place], [address] Phone: [phone]   Fax:  [phone]  Name: Scott Mendez MRN: [id number] Date of Birth: 1980-09-13

## 2020-07-15 ENCOUNTER — Other Ambulatory Visit: Payer: Self-pay

## 2020-07-15 ENCOUNTER — Ambulatory Visit (HOSPITAL_COMMUNITY): Payer: Medicare Other | Admitting: Physical Therapy

## 2020-07-15 DIAGNOSIS — I890 Lymphedema, not elsewhere classified: Secondary | ICD-10-CM | POA: Diagnosis not present

## 2020-07-15 NOTE — Therapy (Signed)
[facility] [address] [place], [place], [address] Phone: [phone]   Fax:  [phone]  Physical Therapy Treatment  Patient Details  Name: Scott Mendez MRN: [id number] Date of Birth: 07-31-1981 No data recorded  Encounter Date: 07/15/2020   PT End of Session - 07/15/20 1657    Visit Number 4    Number of Visits 18    Date for PT Re-Evaluation 08/18/20    Authorization Type medicare/caid    Authorization - Visit Number 4    Authorization - Number of Visits 18    Progress Note Due on Visit 10    PT Start Time 1535    PT Stop Time 1620    PT Time Calculation (min) 45 min    Activity Tolerance Patient tolerated treatment well    Behavior During Therapy Restless           Past Medical History:  Diagnosis Date  . Autism   . Hypertension    states under control with meds., has been on med. x 8 yr.  . Morbid obesity with BMI of 45.0-49.9, adult (HCC)   . Nonrestorable tooth 02/2018   x 2  . Nonverbal   . Sleep apnea    mild - no CPAP    Past Surgical History:  Procedure Laterality Date  . CYST EXCISION Right 03/03/2002   mandible  . MULTIPLE TOOTH EXTRACTIONS  03/03/2002   #1, 16, 17, 30, 32  . TOENAIL EXCISION Bilateral    great toe  . TOOTH EXTRACTION Right 09/03/2014   Procedure: EXTRACTION MOLAR LOWER RIGHT;  Surgeon: Georgia Lopes, DDS;  [facility];  Service: Oral Surgery;  Laterality: Right;  . TOOTH EXTRACTION N/A 03/06/2018   Procedure: EXTRACTION X 1;  Surgeon: Ocie Doyne, DDS;  Location: [facility];  Service: Oral Surgery;  Laterality: N/A;    There were no vitals filed for this visit.   Subjective Assessment - 07/15/20 1655    Subjective MOther reports that son kept his bandages on this time                             [facility] Adult PT Treatment/Exercise - 07/15/20 0001      Manual Therapy   Manual Therapy Compression Bandaging;Manual Lymphatic Drainage  (MLD)    Manual therapy comments completed seperate from all other aspects of treatment    Manual Lymphatic Drainage (MLD) to include supraclavicular, deep and superficial abdominal, inguinal/axillary anastomosis and Rt LE    Compression Bandaging toe to thigh of RT LE using foam and multi layer short stretch bandages                    PT Short Term Goals - 07/07/20 1337      PT SHORT TERM GOAL #1   Title PT Rt LE measurements at 30/20/and 10 cm to decrease from 3-4 cm to reduce risk of cellulitis.    Time 3    Period Weeks    Status New    Target Date 07/28/20      PT SHORT TERM GOAL #2   Title PT mother to state that they are completing the LE exercises at home at least 3x/wk to increase lymphatic circulation    Time 3    Period Weeks    Status New             PT Long Term  Goals - 07/07/20 1338      PT LONG TERM GOAL #1   Title PT Rt LE at 30,20 and 10 cm measurements to be decreased  by 4-6 cm to reduce risk of cellulitis.    Time 6    Period Weeks    Status New    Target Date 08/18/20      PT LONG TERM GOAL #2   Title PT to have compresion garment and mother to be able to don    Time 6    Period Weeks    Status New      PT LONG TERM GOAL #3   Title PT to have and be using a compression pump    Time 6    Period Weeks    Status New      PT LONG TERM GOAL #4   Title PT Rt anterior leg to have good skin integrity.    Time 6    Period Weeks    Status New                 Plan - 07/15/20 1657    Clinical Impression Statement Pt has slight discoloration along anterior ankle jt where it appears bandages have rubbed.  Unable to complete manual or bandaging to Lt due to time restraints.    Personal Factors and Comorbidities Comorbidity 1;Finances;Time since onset of injury/illness/exacerbation    Examination-Activity Limitations Hygiene/Grooming;Other    Stability/Clinical Decision Making Evolving/Moderate complexity    Rehab Potential Good     PT Frequency 3x / week    PT Duration 6 weeks    PT Treatment/Interventions Manual techniques;Manual lymph drainage    PT Next Visit Plan ; begin manual decongestive techniques and bandaging B    PT Home Exercise Plan ankle pump, LAQ, hip ab/adductin, marching- pt does 50% of the time.  Therapist did not attempt diaphragm breathing as she does not believe the patient will be able to understand the concept.           Patient will benefit from skilled therapeutic intervention in order to improve the following deficits and impairments:  Pain,Obesity,Increased edema,Decreased skin integrity  Visit Diagnosis: Lymphedema, not elsewhere classified     Problem List Patient Active Problem List   Diagnosis Date Noted  . Dandruff in adult 10/11/2018  . Cellulitis of right leg 08/19/2018  . Right leg swelling 07/15/2018  . Dental caries 02/28/2018  . 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 Snoring 09/09/2017  . Insomnia 12/01/2014  . Autism 12/22/2012  . IMPAIRED GLUCOSE TOLERANCE 08/17/2010  . Impaired fasting glucose 08/17/2010  . Seasonal allergies 11/18/2009  . Essential hypertension 10/11/2009  . Morbid obesity (HCC) 10/31/2007  . MENTAL RETARDATION 10/31/2007    Virgina Organ, PT CLT [phone] 07/15/2020, 4:59 PM  [facility] [address] [place], [place], [address] Phone: [phone]   Fax:  [phone]  Name: Scott Mendez MRN: [id number] Date of Birth: 1981-04-08

## 2020-07-18 ENCOUNTER — Other Ambulatory Visit: Payer: Self-pay | Admitting: Family Medicine

## 2020-07-18 ENCOUNTER — Other Ambulatory Visit: Payer: Self-pay

## 2020-07-18 ENCOUNTER — Ambulatory Visit (HOSPITAL_COMMUNITY): Payer: Medicare Other | Admitting: Physical Therapy

## 2020-07-18 DIAGNOSIS — I890 Lymphedema, not elsewhere classified: Secondary | ICD-10-CM | POA: Diagnosis not present

## 2020-07-18 NOTE — Therapy (Signed)
[facility] [address] [place], [place], [address] Phone: [phone]   Fax:  [phone]  Physical Therapy Treatment  Patient Details  Name: Scott Mendez MRN: [id number] Date of Birth: [birth year] No data recorded  Encounter Date: 07/18/2020   PT End of Session - 07/18/20 1625    Visit Number 5    Number of Visits 18    Date for PT Re-Evaluation 08/18/20    Authorization Type medicare/caid    Authorization - Visit Number 5    Authorization - Number of Visits 18    Progress Note Due on Visit 10    PT Start Time 1455    PT Stop Time 1604    PT Time Calculation (min) 69 min    Activity Tolerance Patient tolerated treatment well    Behavior During Therapy Restless           Past Medical History:  Diagnosis Date  . Autism   . Hypertension    states under control with meds., has been on med. x 8 yr.  . Morbid obesity with BMI of 45.0-49.9, adult (HCC)   . Nonrestorable tooth 02/2018   x 2  . Nonverbal   . Sleep apnea    mild - no CPAP    Past Surgical History:  Procedure Laterality Date  . CYST EXCISION Right 03/03/2002   mandible  . MULTIPLE TOOTH EXTRACTIONS  03/03/2002   #1, 16, 17, 30, 32  . TOENAIL EXCISION Bilateral    great toe  . TOOTH EXTRACTION Right 09/03/2014   Procedure: EXTRACTION MOLAR LOWER RIGHT;  Surgeon: Georgia Lopes, DDS;  Location: [facility];  Service: Oral Surgery;  Laterality: Right;  . TOOTH EXTRACTION N/A 03/06/2018   Procedure: EXTRACTION X 1;  Surgeon: Ocie Doyne, DDS;  Location: [facility];  Service: Oral Surgery;  Laterality: N/A;    There were no vitals filed for this visit.   Subjective Assessment - 07/18/20 1618    Subjective Mother states he removed his bandages last night and she can't find some of the bandages and foam.    Currently in Pain? No/denies                 LYMPHEDEMA/ONCOLOGY QUESTIONNAIRE - 07/18/20 1619      What other symptoms do you  have   Are you Having Heaviness or Tightness No    Are you having Pain Yes    Are you having pitting edema Yes    Body Site Rt LE     Is it Hard or Difficult finding clothes that fit Yes    Do you have infections No      Lymphedema Stage   Stage STAGE 2 SPONTANEOUSLY IRREVERSIBLE      Lymphedema Assessments   Lymphedema Assessments Lower extremities      Right Lower Extremity Lymphedema   20 cm Proximal to Suprapatella 75 cm   was 85 on 12/2   10 cm Proximal to Suprapatella 67 cm   was 71.6   At Midpatella/Popliteal Crease 52 cm   was 56.4   30 cm Proximal to Floor at Lateral Plantar Foot 47 cm   was 53.3   20 cm Proximal to Floor at Lateral Plantar Foot 44.6 1   was 47.8   10 cm Proximal to Floor at Lateral Malleoli 38.8 cm   was 40   Circumference of ankle/heel 45 cm.   was 46.5   5 cm Proximal to  1st MTP Joint 32.5 cm   was 34.2   Across MTP Joint 30 cm   was 32   Around Proximal Great Toe 10.6 cm   was 12     Left Lower Extremity Lymphedema   20 cm Proximal to Suprapatella 81.5 cm   was 81.5   10 cm Proximal to Suprapatella 69 cm   was 69   At Midpatella/Popliteal Crease 55 cm   was 55   30 cm Proximal to Floor at Lateral Plantar Foot 43.5 cm   was 43.5   20 cm Proximal to Floor at Lateral Plantar Foot 36.5 cm   was 36.5   10 cm Proximal to Floor at Lateral Malleoli 31 cm   was 31   Circumference of ankle/heel 42.7 cm.   was 42.7   5 cm Proximal to 1st MTP Joint 30.8 cm   was 30.8   Across MTP Joint 29.4 cm   was 29.4   Around Proximal Great Toe 12.2 cm   was 12.2                     [facility] Adult PT Treatment/Exercise - 07/18/20 0001      Manual Therapy   Manual Therapy Compression Bandaging;Manual Lymphatic Drainage (MLD);Other (comment)    Manual therapy comments completed seperate from all other aspects of treatment    Manual Lymphatic Drainage (MLD) to include supraclavicular, deep and superficial abdominal, inguinal/axillary anastomosis and Rt LE     Compression Bandaging toe to thigh of RT LE using foam and multi layer short stretch bandages    Other Manual Therapy measurements                    PT Short Term Goals - 07/07/20 1337      PT SHORT TERM GOAL #1   Title PT Rt LE measurements at 30/20/and 10 cm to decrease from 3-4 cm to reduce risk of cellulitis.    Time 3    Period Weeks    Status New    Target Date 07/28/20      PT SHORT TERM GOAL #2   Title PT mother to state that they are completing the LE exercises at home at least 3x/wk to increase lymphatic circulation    Time 3    Period Weeks    Status New             PT Long Term Goals - 07/07/20 1338      PT LONG TERM GOAL #1   Title PT Rt LE at 30,20 and 10 cm measurements to be decreased  by 4-6 cm to reduce risk of cellulitis.    Time 6    Period Weeks    Status New    Target Date 08/18/20      PT LONG TERM GOAL #2   Title PT to have compresion garment and mother to be able to don    Time 6    Period Weeks    Status New      PT LONG TERM GOAL #3   Title PT to have and be using a compression pump    Time 6    Period Weeks    Status New      PT LONG TERM GOAL #4   Title PT Rt anterior leg to have good skin integrity.    Time 6    Period Weeks    Status New  Plan - 07/18/20 1625    Clinical Impression Statement LE's continue to be very dry.  Measured bilateral LE's today with overall reduction in Rt LE and increase in distal Lt LE.  pt short 3 12" bandages today and posterior thigh foam as mother states she is sure he threw it away somewhere.  Manual completed followed by short stretch for Rt LE.  discussed compression option as socks do not seem to be fitting and providing even compression.  Pt given sheet for [employer] to order new garment for Lt to see how this goes.    Personal Factors and Comorbidities Comorbidity 1;Finances;Time since onset of injury/illness/exacerbation    Examination-Activity  Limitations Hygiene/Grooming;Other    Stability/Clinical Decision Making Evolving/Moderate complexity    Rehab Potential Good    PT Frequency 3x / week    PT Duration 6 weeks    PT Treatment/Interventions Manual techniques;Manual lymph drainage    PT Next Visit Plan Continue with manual decongestive techniques and bandaging for Rt LE.  Measure weekly.  Follow up on new comrpession garment for Lt LE.    PT Home Exercise Plan ankle pump, LAQ, hip ab/adductin, marching- pt does 50% of the time.  Therapist did not attempt diaphragm breathing as she does not believe the patient will be able to understand the concept.           Patient will benefit from skilled therapeutic intervention in order to improve the following deficits and impairments:  Pain,Obesity,Increased edema,Decreased skin integrity  Visit Diagnosis: Lymphedema, not elsewhere classified     Problem List Patient Active Problem List   Diagnosis Date Noted  . Dandruff in adult 10/11/2018  . Cellulitis of right leg 08/19/2018  . Right leg swelling 07/15/2018  . Dental caries 02/28/2018  . 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 Snoring 09/09/2017  . Insomnia 12/01/2014  . Autism 12/22/2012  . IMPAIRED GLUCOSE TOLERANCE 08/17/2010  . Impaired fasting glucose 08/17/2010  . Seasonal allergies 11/18/2009  . Essential hypertension 10/11/2009  . Morbid obesity (HCC) 10/31/2007  . MENTAL RETARDATION 10/31/2007   [name], PTA/CLT [phone]  [name] 07/18/2020, 4:28 PM  [facility] [address] [place], [place], [address] Phone: [phone]   Fax:  [phone]  Name: Scott Mendez MRN: [id number] Date of Birth: Apr 02, 1981

## 2020-07-20 ENCOUNTER — Other Ambulatory Visit: Payer: Self-pay

## 2020-07-20 ENCOUNTER — Encounter (HOSPITAL_COMMUNITY): Payer: Self-pay

## 2020-07-20 ENCOUNTER — Ambulatory Visit (HOSPITAL_COMMUNITY): Payer: Medicare Other

## 2020-07-20 DIAGNOSIS — I890 Lymphedema, not elsewhere classified: Secondary | ICD-10-CM | POA: Diagnosis not present

## 2020-07-20 NOTE — Therapy (Signed)
[facility] [address] [place], [place], [address] Phone: [phone]   Fax:  [phone]  Physical Therapy Treatment  Patient Details  Name: Scott Mendez MRN: [id number] Date of Birth: [birth year] No data recorded  Encounter Date: 07/20/2020   PT End of Session - 07/20/20 1648    Visit Number 6    Number of Visits 18    Date for PT Re-Evaluation 08/18/20    Authorization Type medicare/caid    Authorization - Visit Number 6    Authorization - Number of Visits 18    Progress Note Due on Visit 10    PT Start Time 1538    PT Stop Time 1630    PT Time Calculation (min) 52 min    Activity Tolerance Patient tolerated treatment well    Behavior During Therapy [facility] for tasks assessed/performed   Pt danced upon standing, said "Jzam" through session.          Past Medical History:  Diagnosis Date  . Autism   . Hypertension    states under control with meds., has been on med. x 8 yr.  . Morbid obesity with BMI of 45.0-49.9, adult (HCC)   . Nonrestorable tooth 02/2018   x 2  . Nonverbal   . Sleep apnea    mild - no CPAP    Past Surgical History:  Procedure Laterality Date  . CYST EXCISION Right 03/03/2002   mandible  . MULTIPLE TOOTH EXTRACTIONS  03/03/2002   #1, 16, 17, 30, 32  . TOENAIL EXCISION Bilateral    great toe  . TOOTH EXTRACTION Right 09/03/2014   Procedure: EXTRACTION MOLAR LOWER RIGHT;  Surgeon: Georgia Lopes, DDS;  [facility]: [facility];  Service: Oral Surgery;  Laterality: Right;  . TOOTH EXTRACTION N/A 03/06/2018   Procedure: EXTRACTION X 1;  Surgeon: Ocie Doyne, DDS;  Location: [facility];  Service: Oral Surgery;  Laterality: N/A;    There were no vitals filed for this visit.   Subjective Assessment - 07/20/20 1645    Subjective Mother said she called [employer] said they did carry large enough for thigh, only 28in.  Mother reports son wore all the bandages and didn't trash  anything.  Son non-verbal, no facial expressions of pain.    Pertinent History autism; morbid obesity.    Currently in Pain? No/denies                             [facility] Adult PT Treatment/Exercise - 07/20/20 0001      Manual Therapy   Manual Therapy Manual Lymphatic Drainage (MLD);Compression Bandaging    Manual therapy comments Manual complete separate from rest of tx    Manual Lymphatic Drainage (MLD) to include supraclavicular, deep and superficial abdominal, inguinal/axillary anastomosis and Rt LE    Compression Bandaging toe to thigh of RT LE using foam and multi layer short stretch bandages                    PT Short Term Goals - 07/07/20 1337      PT SHORT TERM GOAL #1   Title PT Rt LE measurements at 30/20/and 10 cm to decrease from 3-4 cm to reduce risk of cellulitis.    Time 3    Period Weeks    Status New    Target Date 07/28/20      PT SHORT TERM GOAL #2  Title PT mother to state that they are completing the LE exercises at home at least 3x/wk to increase lymphatic circulation    Time 3    Period Weeks    Status New             PT Long Term Goals - 07/07/20 1338      PT LONG TERM GOAL #1   Title PT Rt LE at 30,20 and 10 cm measurements to be decreased  by 4-6 cm to reduce risk of cellulitis.    Time 6    Period Weeks    Status New    Target Date 08/18/20      PT LONG TERM GOAL #2   Title PT to have compresion garment and mother to be able to don    Time 6    Period Weeks    Status New      PT LONG TERM GOAL #3   Title PT to have and be using a compression pump    Time 6    Period Weeks    Status New      PT LONG TERM GOAL #4   Title PT Rt anterior leg to have good skin integrity.    Time 6    Period Weeks    Status New                 Plan - 07/20/20 1650    Clinical Impression Statement LE's continue to be dry, discussed good lotion for moisturing skin.  Manual decognestive lymphedema techniques  complete including neck, deep/superficial abdominal and anterior/posterior LE.  Application of multilayer short stretch bandages with 1/2in foam toes to thigh.  Encouraged mother to order knee high compressoin garments from [employer]    Personal Factors and Comorbidities Comorbidity 1;Finances;Time since onset of injury/illness/exacerbation    Examination-Activity Limitations Hygiene/Grooming;Other    Stability/Clinical Decision Making Evolving/Moderate complexity    Clinical Decision Making Moderate    Rehab Potential Good    PT Frequency 3x / week    PT Duration 6 weeks    PT Treatment/Interventions Manual techniques;Manual lymph drainage    PT Next Visit Plan Continue with manual decongestive techniques and bandaging for Rt LE.  Measure weekly.  Follow up on new comrpession garment for Lt LE.    PT Home Exercise Plan ankle pump, LAQ, hip ab/adductin, marching- pt does 50% of the time.  Therapist did not attempt diaphragm breathing as she does not believe the patient will be able to understand the concept.           Patient will benefit from skilled therapeutic intervention in order to improve the following deficits and impairments:  Pain,Obesity,Increased edema,Decreased skin integrity  Visit Diagnosis: Lymphedema, not elsewhere classified     Problem List Patient Active Problem List   Diagnosis Date Noted  . Dandruff in adult 10/11/2018  . Cellulitis of right leg 08/19/2018  . Right leg swelling 07/15/2018  . Dental caries 02/28/2018  . 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 Snoring 09/09/2017  . Insomnia 12/01/2014  . Autism 12/22/2012  . IMPAIRED GLUCOSE TOLERANCE 08/17/2010  . Impaired fasting glucose 08/17/2010  . Seasonal allergies 11/18/2009  . Essential hypertension 10/11/2009  . Morbid obesity (HCC) 10/31/2007  . MENTAL RETARDATION 10/31/2007   Becky Sax, LPTA/CLT;  CBIS [phone]  Juel Burrow 07/20/2020, 5:00 PM  [facility] [address] [place], [place], [address] Phone: [phone]   Fax:  [phone]  Name: Scott Mendez MRN: [id number] Date of Birth: 07/27/1981

## 2020-07-22 ENCOUNTER — Ambulatory Visit (HOSPITAL_COMMUNITY): Payer: Medicare Other | Admitting: Physical Therapy

## 2020-07-22 ENCOUNTER — Other Ambulatory Visit: Payer: Self-pay

## 2020-07-22 ENCOUNTER — Encounter (HOSPITAL_COMMUNITY): Payer: Self-pay | Admitting: Physical Therapy

## 2020-07-22 DIAGNOSIS — I890 Lymphedema, not elsewhere classified: Secondary | ICD-10-CM

## 2020-07-22 NOTE — Therapy (Signed)
[facility] [address] [place], [place], [address] Phone: [phone]   Fax:  [phone]  Physical Therapy Treatment  Patient Details  Name: Scott Mendez MRN: [id number] Date of Birth: February 25, 1981 No data recorded  Encounter Date: 07/22/2020   PT End of Session - 07/22/20 1652    Visit Number 7    Number of Visits 18    Date for PT Re-Evaluation 08/18/20    Authorization Type medicare/caid    Authorization - Visit Number 7    Authorization - Number of Visits 18    Progress Note Due on Visit 10    PT Start Time 1540   pt late   PT Stop Time 1650    PT Time Calculation (min) 70 min    Activity Tolerance Patient tolerated treatment well    Behavior During Therapy [facility] for tasks assessed/performed           Past Medical History:  Diagnosis Date  . Autism   . Hypertension    states under control with meds., has been on med. x 8 yr.  . Morbid obesity with BMI of 45.0-49.9, adult (HCC)   . Nonrestorable tooth 02/2018   x 2  . Nonverbal   . Sleep apnea    mild - no CPAP    Past Surgical History:  Procedure Laterality Date  . CYST EXCISION Right 03/03/2002   mandible  . MULTIPLE TOOTH EXTRACTIONS  03/03/2002   #1, 16, 17, 30, 32  . TOENAIL EXCISION Bilateral    great toe  . TOOTH EXTRACTION Right 09/03/2014   Procedure: EXTRACTION MOLAR LOWER RIGHT;  Surgeon: Georgia Lopes, DDS;  Location: [facility] OR;  Service: Oral Surgery;  Laterality: Right;  . TOOTH EXTRACTION N/A 03/06/2018   Procedure: EXTRACTION X 1;  Surgeon: Ocie Doyne, DDS;  Location: [facility];  Service: Oral Surgery;  Laterality: N/A;    There were no vitals filed for this visit.   Subjective Assessment - 07/22/20 1649    Subjective Mother states she tried to call Elastic therapy for knee high but had to leave a message, they called back but she missed their call.  Pt mother states that she is very pleased with the progress so far.                              [facility] Adult PT Treatment/Exercise - 07/22/20 0001      Manual Therapy   Manual Therapy Manual Lymphatic Drainage (MLD);Compression Bandaging    Manual therapy comments Manual complete separate from rest of tx    Manual Lymphatic Drainage (MLD) to include supraclavicular, deep and superficial abdominal, inguinal/axillary anastomosis and Rt LE    Compression Bandaging toe to thigh of RT LE using foam and multi layer short stretch bandages                    PT Short Term Goals - 07/22/20 1655      PT SHORT TERM GOAL #1   Title PT Rt LE measurements at 30/20/and 10 cm to decrease from 3-4 cm to reduce risk of cellulitis.    Time 3    Period Weeks    Status Achieved    Target Date 07/28/20      PT SHORT TERM GOAL #2   Title PT mother to state that they are completing the LE exercises at home at least 3x/wk  to increase lymphatic circulation    Time 3    Period Weeks    Status Achieved             PT Long Term Goals - 07/22/20 1655      PT LONG TERM GOAL #1   Title PT Rt LE at 30,20 and 10 cm measurements to be decreased  by 4-6 cm to reduce risk of cellulitis.    Time 6    Period Weeks    Status Unable to assess      PT LONG TERM GOAL #2   Title PT to have compresion garment and mother to be able to don    Time 6    Period Weeks    Status On-going      PT LONG TERM GOAL #3   Title PT to have and be using a compression pump    Time 6    Period Weeks    Status On-going      PT LONG TERM GOAL #4   Title PT Rt anterior leg to have good skin integrity.    Time 6    Period Weeks    Status On-going                 Plan - 07/22/20 1653    Clinical Impression Statement Pt LE moisturized priot to manual.  Pt Rt LE continues to decrease in volume.    Personal Factors and Comorbidities Comorbidity 1;Finances;Time since onset of injury/illness/exacerbation    Examination-Activity Limitations Hygiene/Grooming;Other     Stability/Clinical Decision Making Evolving/Moderate complexity    Clinical Decision Making Moderate    Rehab Potential Good    PT Frequency 3x / week    PT Duration 6 weeks    PT Treatment/Interventions Manual techniques;Manual lymph drainage    PT Next Visit Plan Continue with manual decongestive techniques and bandaging for Rt LE.  Measure weekly.  Follow up on new comrpession garment for Lt LE.    PT Home Exercise Plan ankle pump, LAQ, hip ab/adductin, marching- pt does 50% of the time.  Therapist did not attempt diaphragm breathing as she does not believe the patient will be able to understand the concept.           Patient will benefit from skilled therapeutic intervention in order to improve the following deficits and impairments:  Pain,Obesity,Increased edema,Decreased skin integrity  Visit Diagnosis: Lymphedema, not elsewhere classified     Problem List Patient Active Problem List   Diagnosis Date Noted  . Dandruff in adult 10/11/2018  . Cellulitis of right leg 08/19/2018  . Right leg swelling 07/15/2018  . Dental caries 02/28/2018  . 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 Snoring 09/09/2017  . Insomnia 12/01/2014  . Autism 12/22/2012  . IMPAIRED GLUCOSE TOLERANCE 08/17/2010  . Impaired fasting glucose 08/17/2010  . Seasonal allergies 11/18/2009  . Essential hypertension 10/11/2009  . Morbid obesity (HCC) 10/31/2007  . MENTAL RETARDATION 10/31/2007    Virgina Organ, PT CLT [phone] 07/22/2020, 4:56 PM  [facility] [address] [place], [place], [address] Phone: [phone]   Fax:  [phone]  Name: Scott Mendez MRN: [id number] Date of Birth: 1981-02-18

## 2020-07-25 ENCOUNTER — Other Ambulatory Visit: Payer: Self-pay

## 2020-07-25 ENCOUNTER — Ambulatory Visit (HOSPITAL_COMMUNITY): Payer: Medicare Other | Admitting: Physical Therapy

## 2020-07-25 DIAGNOSIS — I890 Lymphedema, not elsewhere classified: Secondary | ICD-10-CM

## 2020-07-25 NOTE — Therapy (Signed)
[facility] [address] [place], [place], [address] Phone: [phone]   Fax:  [phone]  Physical Therapy Treatment  Patient Details  Name: Scott Mendez MRN: [id number] Date of Birth: [birth year] No data recorded  Encounter Date: 07/25/2020   PT End of Session - 07/25/20 1523    Visit Number 8    Number of Visits 18    Date for PT Re-Evaluation 08/18/20    Authorization Type medicare/caid    Authorization - Visit Number 8    Authorization - Number of Visits 18    Progress Note Due on Visit 10    PT Start Time 1408    PT Stop Time 1515    PT Time Calculation (min) 67 min    Activity Tolerance Patient tolerated treatment well    Behavior During Therapy WFL for tasks assessed/performed           Past Medical History:  Diagnosis Date   Autism    Hypertension    states under control with meds., has been on med. x 8 yr.   Morbid obesity with BMI of 45.0-49.9, adult (HCC)    Nonrestorable tooth 02/2018   x 2   Nonverbal    Sleep apnea    mild - no CPAP    Past Surgical History:  Procedure Laterality Date   CYST EXCISION Right 03/03/2002   mandible   MULTIPLE TOOTH EXTRACTIONS  03/03/2002   #1, 16, 17, 30, 32   TOENAIL EXCISION Bilateral    great toe   TOOTH EXTRACTION Right 09/03/2014   Procedure: EXTRACTION MOLAR LOWER RIGHT;  Surgeon: Georgia Lopes, DDS;  Location: [facility] OR;  Service: Oral Surgery;  Laterality: Right;   TOOTH EXTRACTION N/A 03/06/2018   Procedure: EXTRACTION X 1;  Surgeon: Ocie Doyne, DDS;  [facility];  Service: Oral Surgery;  Laterality: N/A;    There were no vitals filed for this visit.   Subjective Assessment - 07/25/20 1519    Subjective Pt 's mother states she continues to play phone tag with elastic therapy. Reports no issues currently.    Currently in Pain? No/denies                             [facility] Adult PT  Treatment/Exercise - 07/25/20 0001      Manual Therapy   Manual Therapy Manual Lymphatic Drainage (MLD)    Manual therapy comments Manual complete separate from rest of tx    Manual Lymphatic Drainage (MLD) to include supraclavicular, deep and superficial abdominal, inguinal/axillary anastomosis and Rt LE    Compression Bandaging toe to thigh of RT LE using foam and multi layer short stretch bandages                    PT Short Term Goals - 07/22/20 1655      PT SHORT TERM GOAL #1   Title PT Rt LE measurements at 30/20/and 10 cm to decrease from 3-4 cm to reduce risk of cellulitis.    Time 3    Period Weeks    Status Achieved    Target Date 07/28/20      PT SHORT TERM GOAL #2   Title PT mother to state that they are completing the LE exercises at home at least 3x/wk to increase lymphatic circulation    Time 3    Period Weeks    Status Achieved  PT Long Term Goals - 07/22/20 1655      PT LONG TERM GOAL #1   Title PT Rt LE at 30,20 and 10 cm measurements to be decreased  by 4-6 cm to reduce risk of cellulitis.    Time 6    Period Weeks    Status Unable to assess      PT LONG TERM GOAL #2   Title PT to have compresion garment and mother to be able to don    Time 6    Period Weeks    Status On-going      PT LONG TERM GOAL #3   Title PT to have and be using a compression pump    Time 6    Period Weeks    Status On-going      PT LONG TERM GOAL #4   Title PT Rt anterior leg to have good skin integrity.    Time 6    Period Weeks    Status On-going                 Plan - 07/25/20 1524    Clinical Impression Statement continued with manual lymph drainage for Rt LE.  Able to remove most of the dry skin and applied several coats of lotion to increase moisture.  Will need to be measured next session.    Personal Factors and Comorbidities Comorbidity 1;Finances;Time since onset of injury/illness/exacerbation    Examination-Activity  Limitations Hygiene/Grooming;Other    Stability/Clinical Decision Making Evolving/Moderate complexity    Rehab Potential Good    PT Frequency 3x / week    PT Duration 6 weeks    PT Treatment/Interventions Manual techniques;Manual lymph drainage    PT Next Visit Plan Continue with manual decongestive techniques and bandaging for Rt LE.  Measure weekly.  Follow up on new comrpession garment for Lt LE.    PT Home Exercise Plan ankle pump, LAQ, hip ab/adductin, marching- pt does 50% of the time.  Therapist did not attempt diaphragm breathing as she does not believe the patient will be able to understand the concept.           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 [place], [place], [address] Phone: [phone]   Fax:  [phone]  Name: Scott Mendez MRN: [id number] Date of Birth: 09-14-1980

## 2020-07-26 ENCOUNTER — Other Ambulatory Visit: Payer: Self-pay | Admitting: Family Medicine

## 2020-07-27 ENCOUNTER — Ambulatory Visit (HOSPITAL_COMMUNITY): Payer: Medicare Other | Admitting: Physical Therapy

## 2020-07-27 ENCOUNTER — Other Ambulatory Visit: Payer: Self-pay

## 2020-07-27 DIAGNOSIS — I890 Lymphedema, not elsewhere classified: Secondary | ICD-10-CM | POA: Diagnosis not present

## 2020-07-27 NOTE — Therapy (Signed)
[facility] [address] [place], [place], [address] Phone: [phone]   Fax:  [phone]  Physical Therapy Treatment  Patient Details  Name: Scott Mendez MRN: [id number] Date of Birth: Mar 17, 1981 No data recorded  Encounter Date: 07/27/2020   PT End of Session - 07/27/20 1641    Visit Number 9    Number of Visits 18    Date for PT Re-Evaluation 08/18/20    Authorization Type medicare/caid    Authorization - Visit Number 9    Authorization - Number of Visits 18    Progress Note Due on Visit 10    PT Start Time 1540    PT Stop Time 1640    PT Time Calculation (min) 60 min    Activity Tolerance Patient tolerated treatment well    Behavior During Therapy WFL for tasks assessed/performed           Past Medical History:  Diagnosis Date   Autism    Hypertension    states under control with meds., has been on med. x 8 yr.   Morbid obesity with BMI of 45.0-49.9, adult (HCC)    Nonrestorable tooth 02/2018   x 2   Nonverbal    Sleep apnea    mild - no CPAP    Past Surgical History:  Procedure Laterality Date   CYST EXCISION Right 03/03/2002   mandible   MULTIPLE TOOTH EXTRACTIONS  03/03/2002   #1, 16, 17, 30, 32   TOENAIL EXCISION Bilateral    great toe   TOOTH EXTRACTION Right 09/03/2014   Procedure: EXTRACTION MOLAR LOWER RIGHT;  Surgeon: Georgia Lopes, DDS;  Location: [facility] OR;  Service: Oral Surgery;  Laterality: Right;   TOOTH EXTRACTION N/A 03/06/2018   Procedure: EXTRACTION X 1;  Surgeon: Ocie Doyne, DDS;  Location: [facility];  Service: Oral Surgery;  Laterality: N/A;    There were no vitals filed for this visit.   Subjective Assessment - 07/27/20 1640    Subjective Pt's mother states he kept on his bandaging.  States his stockings should be here tomorrow.    Currently in Pain? No/denies                 LYMPHEDEMA/ONCOLOGY QUESTIONNAIRE - 07/27/20 1648      What  other symptoms do you have   Are you Having Heaviness or Tightness No    Are you having Pain Yes    Are you having pitting edema Yes    Body Site Rt LE     Is it Hard or Difficult finding clothes that fit Yes    Do you have infections No      Lymphedema Stage   Stage STAGE 2 SPONTANEOUSLY IRREVERSIBLE      Lymphedema Assessments   Lymphedema Assessments Lower extremities      Right Lower Extremity Lymphedema   20 cm Proximal to Suprapatella 75 cm   was 85 on 12/2   10 cm Proximal to Suprapatella 67 cm   was 71.6   At Midpatella/Popliteal Crease 52 cm   was 56.4   30 cm Proximal to Floor at Lateral Plantar Foot 47 cm   was 53.3   20 cm Proximal to Floor at Lateral Plantar Foot 41 1   was 47.8   10 cm Proximal to Floor at Lateral Malleoli 35 cm   was 40   Circumference of ankle/heel 44 cm.   was 46.5   5 cm Proximal  to 1st MTP Joint 32 cm   was 34.2   Across MTP Joint 30 cm   was 32   Around Proximal Great Toe 10.6 cm   was 12     Left Lower Extremity Lymphedema   20 cm Proximal to Suprapatella 78 cm   was 81.5   10 cm Proximal to Suprapatella 67 cm   was 69   At Midpatella/Popliteal Crease 55 cm   was 55   30 cm Proximal to Floor at Lateral Plantar Foot 47 cm   was 43.5   20 cm Proximal to Floor at Lateral Plantar Foot 38 cm   was 36.5   10 cm Proximal to Floor at Lateral Malleoli 30 cm   was 31   Circumference of ankle/heel 42 cm.   was 42.7   5 cm Proximal to 1st MTP Joint 32 cm   was 30.8   Across MTP Joint 30 cm   was 29.4   Around Proximal Great Toe 11 cm   was 12.2                     [facility] Adult PT Treatment/Exercise - 07/27/20 0001      Manual Therapy   Manual Therapy Manual Lymphatic Drainage (MLD)    Manual therapy comments Manual complete separate from rest of tx    Manual Lymphatic Drainage (MLD) to include supraclavicular, deep and superficial abdominal, inguinal/axillary anastomosis and Rt LE    Compression Bandaging toe to thigh of RT LE using  foam and multi layer short stretch bandages    Other Manual Therapy measurements                    PT Short Term Goals - 07/22/20 1655      PT SHORT TERM GOAL #1   Title PT Rt LE measurements at 30/20/and 10 cm to decrease from 3-4 cm to reduce risk of cellulitis.    Time 3    Period Weeks    Status Achieved    Target Date 07/28/20      PT SHORT TERM GOAL #2   Title PT mother to state that they are completing the LE exercises at home at least 3x/wk to increase lymphatic circulation    Time 3    Period Weeks    Status Achieved             PT Long Term Goals - 07/22/20 1655      PT LONG TERM GOAL #1   Title PT Rt LE at 30,20 and 10 cm measurements to be decreased  by 4-6 cm to reduce risk of cellulitis.    Time 6    Period Weeks    Status Unable to assess      PT LONG TERM GOAL #2   Title PT to have compresion garment and mother to be able to don    Time 6    Period Weeks    Status On-going      PT LONG TERM GOAL #3   Title PT to have and be using a compression pump    Time 6    Period Weeks    Status On-going      PT LONG TERM GOAL #4   Title PT Rt anterior leg to have good skin integrity.    Time 6    Period Weeks    Status On-going  Plan - 07/27/20 1643    Clinical Impression Statement Measured bil LE's today with no change in lt LE and reduction only at 10 and 20cm proximal to floor on Rt LE.  Continued wiht manual lymph drainage, moisturizing and short stretch to Rt LE.  Requested mother bring the stockings next visit for therapist to check for approriate sizing/fit.  Discussed pt nearing end of treatement and if not reducing next week will plan for discharge to home maintenance phase.  Mother verbalized understanding.    Personal Factors and Comorbidities Comorbidity 1;Finances;Time since onset of injury/illness/exacerbation    Examination-Activity Limitations Hygiene/Grooming;Other    Stability/Clinical Decision Making  Evolving/Moderate complexity    Rehab Potential Good    PT Frequency 3x / week    PT Duration 6 weeks    PT Treatment/Interventions Manual techniques;Manual lymph drainage    PT Next Visit Plan Continue with manual decongestive techniques and bandaging for Rt LE.  Measure weekly.  Follow up on new comrpession garment for Lt LE.    PT Home Exercise Plan ankle pump, LAQ, hip ab/adductin, marching- pt does 50% of the time.  Therapist did not attempt diaphragm breathing as she does not believe the patient will be able to understand the concept.           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 10/14/2017   Advanced sleep phase syndrome 10/14/2017   Snoring 09/09/2017   Insomnia 12/01/2014   Autism 12/22/2012   IMPAIRED GLUCOSE TOLERANCE 08/17/2010   Impaired fasting glucose 08/17/2010   Seasonal allergies 11/18/2009   Essential hypertension 10/11/2009   Morbid obesity (HCC) 10/31/2007   MENTAL RETARDATION 10/31/2007   [name], PTA/CLT [phone]  [name] 07/27/2020, 4:49 PM  [facility] [address] [place], [place], [address] Phone: [phone]   Fax:  [phone]  Name: Scott Mendez MRN: [id number] Date of Birth: 07/19/1981

## 2020-08-01 ENCOUNTER — Other Ambulatory Visit: Payer: Self-pay | Admitting: Family Medicine

## 2020-08-01 DIAGNOSIS — E559 Vitamin D deficiency, unspecified: Secondary | ICD-10-CM

## 2020-08-02 ENCOUNTER — Ambulatory Visit (HOSPITAL_COMMUNITY): Payer: Medicare Other | Admitting: Physical Therapy

## 2020-08-02 ENCOUNTER — Other Ambulatory Visit: Payer: Self-pay

## 2020-08-02 DIAGNOSIS — I890 Lymphedema, not elsewhere classified: Secondary | ICD-10-CM | POA: Diagnosis not present

## 2020-08-02 NOTE — Therapy (Signed)
[place], [place], [address] Phone: [phone]   Fax:  [phone]  Physical Therapy Treatment  Patient Details  Name: Scott Mendez MRN: [id number] Date of Birth: 1980/12/03 No data recorded  Encounter Date: 08/02/2020   PT End of Session - 08/02/20 1701    Visit Number 10    Number of Visits 18    Date for PT Re-Evaluation 08/18/20    Authorization Type medicare/caid    Authorization - Visit Number 10    Authorization - Number of Visits 18    Progress Note Due on Visit 18    PT Start Time 3790    PT Stop Time 1640    PT Time Calculation (min) 70 min    Activity Tolerance Patient tolerated treatment well    Behavior During Therapy [facility] for tasks assessed/performed           Past Medical History:  Diagnosis Date  . Autism   . Hypertension    states under control with meds., has been on med. x 8 yr.  . Morbid obesity with BMI of 45.0-49.9, adult ([place])   . Nonrestorable tooth 02/2018   x 2  . Nonverbal   . Sleep apnea    mild - no CPAP    Past Surgical History:  Procedure Laterality Date  . CYST EXCISION Right 03/03/2002   mandible  . MULTIPLE TOOTH EXTRACTIONS  03/03/2002   #1, 16, 17, 30, 32  . TOENAIL EXCISION Bilateral    great toe  . TOOTH EXTRACTION Right 09/03/2014   Procedure: EXTRACTION MOLAR LOWER RIGHT;  Surgeon: Gae Bon, DDS;  Location: [place];  Service: Oral Surgery;  Laterality: Right;  . TOOTH EXTRACTION N/A 03/06/2018   Procedure: EXTRACTION X 1;  Surgeon: Diona Browner, DDS;  Location: [place];  Service: Oral Surgery;  Laterality: N/A;    There were no vitals filed for this visit.   Subjective Assessment - 08/02/20 1536    Subjective PT's mother states that she still has not gotten the compression garments.    Pertinent History autism; morbid obesity.    Currently in Pain? No/denies   no facial expressoin of pain.                 LYMPHEDEMA/ONCOLOGY QUESTIONNAIRE - 08/02/20 0001      What other symptoms do you have   Are you Having Heaviness or Tightness No    Are you having Pain Yes    Are you having pitting edema Yes    Body Site Rt LE     Is it Hard or Difficult finding clothes that fit Yes    Do you have infections No      Lymphedema Stage   Stage STAGE 2 SPONTANEOUSLY IRREVERSIBLE      Lymphedema Assessments   Lymphedema Assessments Lower extremities      Right Lower Extremity Lymphedema   20 cm Proximal to Suprapatella 81 cm   initally 85   10 cm Proximal to Suprapatella 67 cm   initially 71.6   At Midpatella/Popliteal Crease 53 cm   56.5   30 cm Proximal to Floor at Lateral Plantar Foot 47.2 cm   was 53.3   20 cm Proximal to Floor at Lateral Plantar Foot 43.2 1   47.8   10 cm Proximal to Floor at Lateral Malleoli 38 cm   40   Circumference of ankle/heel 45 cm.   was 46.5  5 cm Proximal to 1st MTP Joint 32 cm   was 34.2   Across MTP Joint 30 cm   was 32   Around Proximal Great Toe 10.9 cm   was 12     Left Lower Extremity Lymphedema   20 cm Proximal to Suprapatella 80 cm   was 81.5   10 cm Proximal to Suprapatella 67 cm   was 69   At Midpatella/Popliteal Crease 53 cm   was 55   30 cm Proximal to Floor at Lateral Plantar Foot 45 cm   was 43.5   20 cm Proximal to Floor at Lateral Plantar Foot 38 cm   was 36.5   10 cm Proximal to Floor at Lateral Malleoli 30 cm   was 31   Circumference of ankle/heel 42 cm.   was 42.7   5 cm Proximal to 1st MTP Joint 32 cm   was 30.8   Across MTP Joint 30 cm   was 29.4   Around Proximal Great Toe 10.9 cm   was 12.2                     [facility] Adult PT Treatment/Exercise - 08/02/20 0001      Manual Therapy   Manual Therapy Manual Lymphatic Drainage (MLD)    Manual therapy comments Manual complete separate from rest of tx    Manual Lymphatic Drainage (MLD) to include supraclavicular, deep and superficial abdominal, inguinal/axillary anastomosis and Rt  LE    Compression Bandaging toe to thigh of RT LE using foam and multi layer short stretch bandages    Other Manual Therapy measurements                    PT Short Term Goals - 08/02/20 1705      PT SHORT TERM GOAL #1   Title PT Rt LE measurements at 30/20/and 10 cm to decrease from 3-4 cm to reduce risk of cellulitis.    Time 3    Period Weeks    Status Achieved    Target Date 07/28/20      PT SHORT TERM GOAL #2   Title PT mother to state that they are completing the LE exercises at home at least 3x/wk to increase lymphatic circulation    Time 3    Period Weeks    Status Achieved             PT Long Term Goals - 08/02/20 1705      PT LONG TERM GOAL #1   Title PT Rt LE at 30,20 and 10 cm measurements to be decreased  by 4-6 cm to reduce risk of cellulitis.    Time 6    Period Weeks    Status Partially Met      PT LONG TERM GOAL #2   Title PT to have compresion garment and mother to be able to don    Time 6    Period Weeks    Status On-going      PT LONG TERM GOAL #3   Title PT to have and be using a compression pump    Time 6    Period Weeks    Status On-going      PT LONG TERM GOAL #4   Title PT Rt anterior leg to have good skin integrity.    Time 6    Period Weeks    Status On-going  Plan - 08/02/20 1703    Clinical Impression Statement PT has not been seen for six days due to holiday, pt has increased in measurement at several sites but is still making good gains.  Pt will continue to be seen until he has obtained appropriate compression garments to keep LE edema under control.    Personal Factors and Comorbidities Comorbidity 1;Finances;Time since onset of injury/illness/exacerbation    Examination-Activity Limitations Hygiene/Grooming;Other    Stability/Clinical Decision Making Evolving/Moderate complexity    Rehab Potential Good    PT Frequency 3x / week    PT Duration 6 weeks    PT Treatment/Interventions Manual  techniques;Manual lymph drainage    PT Next Visit Plan Continue with manual decongestive techniques and bandaging for Rt LE.  Measure weekly.  Follow up on new comrpession garment for Lt LE.    PT Home Exercise Plan ankle pump, LAQ, hip ab/adductin, marching- pt does 50% of the time.  Therapist did not attempt diaphragm breathing as she does not believe the patient will be able to understand the concept.           Patient will benefit from skilled therapeutic intervention in order to improve the following deficits and impairments:  Pain,Obesity,Increased edema,Decreased skin integrity  Visit Diagnosis: Lymphedema, not elsewhere classified     Problem List Patient Active Problem List   Diagnosis Date Noted  . Dandruff in adult 10/11/2018  . Cellulitis of right leg 08/19/2018  . Right leg swelling 07/15/2018  . Dental caries 02/28/2018  . 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 Snoring 09/09/2017  . Insomnia 12/01/2014  . Autism 12/22/2012  . IMPAIRED GLUCOSE TOLERANCE 08/17/2010  . Impaired fasting glucose 08/17/2010  . Seasonal allergies 11/18/2009  . Essential hypertension 10/11/2009  . Morbid obesity ([place]) 10/31/2007  . MENTAL RETARDATION 10/31/2007    Rayetta Humphrey, PT CLT [phone] 08/02/2020, 5:06 PM  [facility] [address] [place], [place], [address] Phone: [phone]   Fax:  [phone]  Name: JESHUA RANSFORD MRN: [id number] Date of Birth: [birth year]

## 2020-08-04 ENCOUNTER — Ambulatory Visit (HOSPITAL_COMMUNITY): Payer: Medicare Other | Admitting: Physical Therapy

## 2020-08-04 ENCOUNTER — Other Ambulatory Visit: Payer: Self-pay

## 2020-08-04 DIAGNOSIS — I890 Lymphedema, not elsewhere classified: Secondary | ICD-10-CM

## 2020-08-04 NOTE — Therapy (Signed)
[place], [place], [address] Phone: [phone]   Fax:  [phone]  Physical Therapy Treatment  Patient Details  Name: Scott Mendez MRN: [id number] Date of Birth: 1980/11/14 No data recorded PHYSICAL THERAPY DISCHARGE SUMMARY  Visits from Start of Care: 11  Current functional level related to goals / functional outcomes: See below   Remaining deficits: See below  Education / Equipment: Given D/C instruction for LE  Plan: Patient agrees to discharge.  Patient goals were met. Patient is being discharged due to meeting the stated rehab goals.  ?????     Encounter Date: 08/04/2020   PT End of Session - 08/04/20 1632    Visit Number 11    Number of Visits 11    Date for PT Re-Evaluation 08/18/20    Authorization Type medicare/caid    Authorization - Visit Number 11    PT Start Time 3614    PT Stop Time 1630    PT Time Calculation (min) 60 min    Activity Tolerance Patient tolerated treatment well    Behavior During Therapy WFL for tasks assessed/performed           Past Medical History:  Diagnosis Date  . Autism   . Hypertension    states under control with meds., has been on med. x 8 yr.  . Morbid obesity with BMI of 45.0-49.9, adult ([place])   . Nonrestorable tooth 02/2018   x 2  . Nonverbal   . Sleep apnea    mild - no CPAP    Past Surgical History:  Procedure Laterality Date  . CYST EXCISION Right 03/03/2002   mandible  . MULTIPLE TOOTH EXTRACTIONS  03/03/2002   #1, 16, 17, 30, 32  . TOENAIL EXCISION Bilateral    great toe  . TOOTH EXTRACTION Right 09/03/2014   Procedure: EXTRACTION MOLAR LOWER RIGHT;  Surgeon: Gae Bon, DDS;  Location: [facility];  Service: Oral Surgery;  Laterality: Right;  . TOOTH EXTRACTION N/A 03/06/2018   Procedure: EXTRACTION X 1;  Surgeon: Diona Browner, DDS;  Location: [facility];  Service: Oral Surgery;  Laterality: N/A;    There were  no vitals filed for this visit.   Subjective Assessment - 08/04/20 1629    Subjective Pt's mother states that she got one pair of compression knee high garments in the mail; there was no difficulty getting them on    Pertinent History autism; morbid obesity.    Currently in Pain? No/denies                 LYMPHEDEMA/ONCOLOGY QUESTIONNAIRE - 08/04/20 0001      Right Lower Extremity Lymphedema   20 cm Proximal to Suprapatella 82 cm   imital 85   10 cm Proximal to Suprapatella 72 cm   71.6   At Midpatella/Popliteal Crease 52 cm   56.5   30 cm Proximal to Floor at Lateral Plantar Foot 48 cm   53.3   20 cm Proximal to Floor at Lateral Plantar Foot 42 1   47.8   10 cm Proximal to Floor at Lateral Malleoli 37.8 cm   40   Circumference of ankle/heel 44 cm.   46.5   5 cm Proximal to 1st MTP Joint 31.3 cm   34.2   Across MTP Joint 29.5 cm   32   Around Proximal Great Toe 10.7 cm   12     Left Lower Extremity Lymphedema  20 cm Proximal to Suprapatella 78.2 cm   81.5   10 cm Proximal to Suprapatella 64 cm   69   At Midpatella/Popliteal Crease 52.8 cm   55   30 cm Proximal to Floor at Lateral Plantar Foot 46 cm   43.5   20 cm Proximal to Floor at Lateral Plantar Foot 34 cm   36.5   10 cm Proximal to Floor at Lateral Malleoli 30 cm   31   Circumference of ankle/heel 40.8 cm.   42.7   5 cm Proximal to 1st MTP Joint 31.8 cm   30.8   Across MTP Joint 28 cm   29.4   Around Proximal Great Toe 11 cm   12.2                     [facility] Adult PT Treatment/Exercise - 08/04/20 0001      Manual Therapy   Manual Therapy Manual Lymphatic Drainage (MLD)    Manual therapy comments Manual complete separate from rest of tx    Manual Lymphatic Drainage (MLD) to include supraclavicular, deep and superficial abdominal, inguinal/axillary anastomosis and Rt LE    Compression Bandaging education on how to don compression garments    Other Manual Therapy measurements                   PT Education - 08/04/20 1631    Education Details to clean and roll bandages and keep them in a place that they will remember, that it is important to make sure that the patient has his compression garments on and the best way to don them; the importance of wearing all day long.    Person(s) Educated Patient    Methods Explanation    Comprehension Verbalized understanding            PT Short Term Goals - 08/02/20 1705      PT SHORT TERM GOAL #1   Title PT Rt LE measurements at 30/20/and 10 cm to decrease from 3-4 cm to reduce risk of cellulitis.    Time 3    Period Weeks    Status Achieved    Target Date 07/28/20      PT SHORT TERM GOAL #2   Title PT mother to state that they are completing the LE exercises at home at least 3x/wk to increase lymphatic circulation    Time 3    Period Weeks    Status Achieved             PT Long Term Goals - 08/04/20 1636      PT LONG TERM GOAL #1   Title PT Rt LE at 30,20 and 10 cm measurements to be decreased  by 4-6 cm to reduce risk of cellulitis.    Time 6    Period Weeks    Status Achieved      PT LONG TERM GOAL #2   Title PT to have compresion garment and mother to be able to don    Time 6    Period Weeks    Status Achieved      PT LONG TERM GOAL #3   Title PT to have and be using a compression pump    Time 6    Period Weeks    Status On-going      PT LONG TERM GOAL #4   Title PT Rt anterior leg to have good skin integrity.    Time 6    Period  Weeks    Status Achieved                 Plan - 08/04/20 1634    Clinical Impression Statement Pt has recieved his compression garment.  His measurments of his Rt and LT legs are almost even.  Mother agrees to discharge as they are coming from [place] which is taxing on her.  Pt goals have been met.    Personal Factors and Comorbidities Comorbidity 1;Finances;Time since onset of injury/illness/exacerbation    Examination-Activity Limitations  Hygiene/Grooming;Other    Stability/Clinical Decision Making Evolving/Moderate complexity    Rehab Potential Good    PT Frequency 3x / week    PT Duration 6 weeks    PT Treatment/Interventions Manual techniques;Manual lymph drainage    PT Next Visit Plan Discharge.    PT Home Exercise Plan ankle pump, LAQ, hip ab/adductin, marching- pt does 50% of the time.  Therapist did not attempt diaphragm breathing as she does not believe the patient will be able to understand the concept.           Patient will benefit from skilled therapeutic intervention in order to improve the following deficits and impairments:  Pain,Obesity,Increased edema,Decreased skin integrity  Visit Diagnosis: Lymphedema, not elsewhere classified     Problem List Patient Active Problem List   Diagnosis Date Noted  . Dandruff in adult 10/11/2018  . Cellulitis of right leg 08/19/2018  . Right leg swelling 07/15/2018  . Dental caries 02/28/2018  . 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 Snoring 09/09/2017  . Insomnia 12/01/2014  . Autism 12/22/2012  . IMPAIRED GLUCOSE TOLERANCE 08/17/2010  . Impaired fasting glucose 08/17/2010  . Seasonal allergies 11/18/2009  . Essential hypertension 10/11/2009  . Morbid obesity ([place]) 10/31/2007  . MENTAL RETARDATION 10/31/2007  Rayetta Humphrey, PT CLT [phone] 08/04/2020, 4:37 PM  [facility] [address] [place], [place], [address] Phone: [phone]   Fax:  [phone]  Name: Scott Mendez MRN: [id number] Date of Birth: 1981/06/30

## 2020-08-08 ENCOUNTER — Ambulatory Visit (HOSPITAL_COMMUNITY): Payer: Medicare Other | Admitting: Physical Therapy

## 2020-08-10 ENCOUNTER — Ambulatory Visit (HOSPITAL_COMMUNITY): Payer: Medicare Other | Admitting: Physical Therapy

## 2020-08-12 ENCOUNTER — Ambulatory Visit (HOSPITAL_COMMUNITY): Payer: Medicare Other

## 2020-08-16 ENCOUNTER — Encounter (HOSPITAL_COMMUNITY): Payer: Medicare Other | Admitting: Physical Therapy

## 2020-08-18 ENCOUNTER — Encounter (HOSPITAL_COMMUNITY): Payer: Medicare Other

## 2020-08-19 ENCOUNTER — Encounter (HOSPITAL_COMMUNITY): Payer: Medicare Other | Admitting: Physical Therapy

## 2020-08-22 ENCOUNTER — Encounter (HOSPITAL_COMMUNITY): Payer: Medicare Other | Admitting: Physical Therapy

## 2020-08-23 ENCOUNTER — Telehealth (INDEPENDENT_AMBULATORY_CARE_PROVIDER_SITE_OTHER): Payer: Medicare Other | Admitting: Family Medicine

## 2020-08-23 ENCOUNTER — Other Ambulatory Visit: Payer: Self-pay

## 2020-08-23 DIAGNOSIS — M7989 Other specified soft tissue disorders: Secondary | ICD-10-CM | POA: Diagnosis not present

## 2020-08-23 DIAGNOSIS — I10 Essential (primary) hypertension: Secondary | ICD-10-CM | POA: Diagnosis not present

## 2020-08-23 DIAGNOSIS — F5104 Psychophysiologic insomnia: Secondary | ICD-10-CM

## 2020-08-23 DIAGNOSIS — J302 Other seasonal allergic rhinitis: Secondary | ICD-10-CM | POA: Diagnosis not present

## 2020-08-23 DIAGNOSIS — Z1159 Encounter for screening for other viral diseases: Secondary | ICD-10-CM

## 2020-08-23 DIAGNOSIS — E559 Vitamin D deficiency, unspecified: Secondary | ICD-10-CM | POA: Diagnosis not present

## 2020-08-23 DIAGNOSIS — R7301 Impaired fasting glucose: Secondary | ICD-10-CM | POA: Diagnosis not present

## 2020-08-23 MED ORDER — TRAZODONE HCL 100 MG PO TABS: 100.0000 mg | ORAL_TABLET | Freq: Every day | ORAL | 1 refills | Status: DC

## 2020-08-23 MED ORDER — MONTELUKAST SODIUM 10 MG PO TABS: 10.0000 mg | ORAL_TABLET | Freq: Every day | ORAL | 1 refills | Status: DC

## 2020-08-23 MED ORDER — FUROSEMIDE 20 MG PO TABS: 20.0000 mg | ORAL_TABLET | Freq: Two times a day (BID) | ORAL | 1 refills | Status: DC

## 2020-08-23 MED ORDER — OLMESARTAN MEDOXOMIL-HCTZ 20-12.5 MG PO TABS: 1.0000 | ORAL_TABLET | Freq: Every day | ORAL | 1 refills | Status: DC

## 2020-08-23 MED ORDER — POTASSIUM CHLORIDE CRYS ER 20 MEQ PO TBCR: 20.0000 meq | EXTENDED_RELEASE_TABLET | Freq: Two times a day (BID) | ORAL | 1 refills | Status: DC

## 2020-08-23 NOTE — Progress Notes (Signed)
Virtual Visit via Telephone Note  I connected with Scott Mendez on 08/23/20 at 10:20 AM EST by telephone and verified that I am speaking with the correct person using two identifiers.  Location: Patient: home Provider: work   I discussed the limitations, risks, security and privacy concerns of performing an evaluation and management service by telephone and the availability of in person appointments. I also discussed with the patient that there may be a patient responsible charge related to this service. The patient expressed understanding and agreed to proceed.   History of Present Illness: Virtual Visit via Telephone Note  I connected with Scott Mendez on 08/24/20 at 10:20 AM EST by telephone and verified that I am speaking with the correct person using two identifiers.  Location: Patient: home Provider: work   I discussed the limitations, risks, security and privacy concerns of performing an evaluation and management service by telephone and the availability of in person appointments. I also discussed with the patient that there may be a patient responsible charge related to this service. The patient expressed understanding and agreed to proceed.   History of Present Illness: Mother is historian, patient aphasic, has autism Improved leg swelling with PT, mother very pleased, states there is some recurrence taking place, but not sufficient to return to PT currently, completed course less than 1 month ago Has also had vascular eval and recommendation and fitting for compression hose and leg elevation   Observations/Objective: Denies recent fever or chills. Denies sinus pressure, nasal congestion Denies chest congestion, productive cough or wheezing. Chronic  leg swelling, improved with PT Denies , vomiting,diarrhea or constipation.    Chronic  limitation in mobility due to obesity  Deni or insomnia. Denies skin break down or rash.      Assessment and  Plan: Essential hypertension Controlled, no change in medication DASH diet and commitment to daily physical activity for a minimum of 30 minutes discussed and encouraged, as a part of hypertension management. The importance of attaining a healthy weight is also discussed.  BP/Weight 06/23/2020 06/16/2020 05/18/2020 05/11/2020 04/13/2020 12/02/2019 10/15/2019  Systolic BP 132 132 135 134 128 127 139  Diastolic BP 83 83 84 80 82 84 93  Wt. (Lbs) 371 370.8 374 364.04 372 364 -  BMI 54.79 54.76 55.23 48.03 49.08 48.02 -  Some encounter information is confidential and restricted. Go to Review Flowsheets activity to see all data.       Morbid obesity  Patient re-educated about  the importance of commitment to a  minimum of 150 minutes of exercise per week as able.  The importance of healthy food choices with portion control discussed, as well as eating regularly and within a 12 hour window most days. The need to choose "clean , green" food 50 to 75% of the time is discussed, as well as to make water the primary drink and set a goal of 64 ounces water daily.    Weight /BMI 06/23/2020 06/16/2020 05/18/2020  WEIGHT 371 lb 370 lb 12.8 oz 374 lb  HEIGHT 5\' 9"  5\' 9"  5\' 9"   BMI 54.79 kg/m2 54.76 kg/m2 55.23 kg/m2  Some encounter information is confidential and restricted. Go to Review Flowsheets activity to see all data.      Insomnia Controlled, no change in medication   Seasonal allergies Controlled, no change in medication   Right leg swelling Improved with therapy, will continue compression hose and leg elevation    Follow Up Instructions:    I discussed  the assessment and treatment plan with the patient. The patient was provided an opportunity to ask questions and all were answered. The patient agreed with the plan and demonstrated an understanding of the instructions.   The patient was advised to call back or seek an in-person evaluation if the symptoms worsen or if the  condition fails to improve as anticipated.  I provided 20 minutes of non-face-to-face time during this encounter.   Syliva Overman, MD     Observations/Objective: There were no vitals taken for this visit.   Assessment and Plan: Essential hypertension Controlled, no change in medication DASH diet and commitment to daily physical activity for a minimum of 30 minutes discussed and encouraged, as a part of hypertension management. The importance of attaining a healthy weight is also discussed.  BP/Weight 06/23/2020 06/16/2020 05/18/2020 05/11/2020 04/13/2020 12/02/2019 10/15/2019  Systolic BP 132 132 135 134 128 127 139  Diastolic BP 83 83 84 80 82 84 93  Wt. (Lbs) 371 370.8 374 364.04 372 364 -  BMI 54.79 54.76 55.23 48.03 49.08 48.02 -  Some encounter information is confidential and restricted. Go to Review Flowsheets activity to see all data.       Morbid obesity  Patient re-educated about  the importance of commitment to a  minimum of 150 minutes of exercise per week as able.  The importance of healthy food choices with portion control discussed, as well as eating regularly and within a 12 hour window most days. The need to choose "clean , green" food 50 to 75% of the time is discussed, as well as to make water the primary drink and set a goal of 64 ounces water daily.    Weight /BMI 06/23/2020 06/16/2020 05/18/2020  WEIGHT 371 lb 370 lb 12.8 oz 374 lb  HEIGHT 5\' 9"  5\' 9"  5\' 9"   BMI 54.79 kg/m2 54.76 kg/m2 55.23 kg/m2  Some encounter information is confidential and restricted. Go to Review Flowsheets activity to see all data.      Insomnia Controlled, no change in medication   Seasonal allergies Controlled, no change in medication   Right leg swelling Improved with therapy, will continue compression hose and leg elevation    Follow Up Instructions:    I discussed the assessment and treatment plan with the patient. The patient was provided an opportunity to  ask questions and all were answered. The patient agreed with the plan and demonstrated an understanding of the instructions.   The patient was advised to call back or seek an in-person evaluation if the symptoms worsen or if the condition fails to improve as anticipated.  I provided 20 minutes of non-face-to-face time during this encounter.   , MD

## 2020-08-23 NOTE — Patient Instructions (Addendum)
F/U in office with MD first week in April , re evaluate leg swelling and blood pressure, call if you need me sooner  No medication changes  Please get fasting CBC, lipid, cmp and EGFr, TSH, Vit D and hepatitis C screen  March 31 or shortly after  Please DO follow through as we just discussed , and ensure he gets his Nurse, adult , this is overdue  Think about what you will eat, plan ahead. Choose " clean, green, fresh or frozen" over canned, processed or packaged foods which are more sugary, salty and fatty. 70 to 75% of food eaten should be vegetables and fruit. Three meals at set times with snacks allowed between meals, but they must be fruit or vegetables. Aim to eat over a 12 hour period , example 7 am to 7 pm, and STOP after  your last meal of the day. Drink water,generally about 64 ounces per day, no other drink is as healthy. Fruit juice is best enjoyed in a healthy way, by EATING the fruit. It is important that you exercise regularly at least 30 minutes 5 times a week. If you develop chest pain, have severe difficulty breathing, or feel very tired, stop exercising immediately and seek medical attention  Thanks for choosing [facility], we consider it a privelige to serve you. Best for 2022!

## 2020-08-24 ENCOUNTER — Encounter: Payer: Self-pay | Admitting: Family Medicine

## 2020-08-24 ENCOUNTER — Encounter (HOSPITAL_COMMUNITY): Payer: Medicare Other | Admitting: Physical Therapy

## 2020-08-24 NOTE — Assessment & Plan Note (Signed)
Improved with therapy, will continue compression hose and leg elevation

## 2020-08-24 NOTE — Assessment & Plan Note (Signed)
Controlled, no change in medication  

## 2020-08-24 NOTE — Assessment & Plan Note (Signed)
Controlled, no change in medication DASH diet and commitment to daily physical activity for a minimum of 30 minutes discussed and encouraged, as a part of hypertension management. The importance of attaining a healthy weight is also discussed.  BP/Weight 06/23/2020 06/16/2020 05/18/2020 05/11/2020 04/13/2020 12/02/2019 10/15/2019  Systolic BP 132 132 135 134 128 127 139  Diastolic BP 83 83 84 80 82 84 93  Wt. (Lbs) 371 370.8 374 364.04 372 364 -  BMI 54.79 54.76 55.23 48.03 49.08 48.02 -  Some encounter information is confidential and restricted. Go to Review Flowsheets activity to see all data.

## 2020-08-24 NOTE — Assessment & Plan Note (Signed)
  Patient re-educated about  the importance of commitment to a  minimum of 150 minutes of exercise per week as able.  The importance of healthy food choices with portion control discussed, as well as eating regularly and within a 12 hour window most days. The need to choose "clean , green" food 50 to 75% of the time is discussed, as well as to make water the primary drink and set a goal of 64 ounces water daily.    Weight /BMI 06/23/2020 06/16/2020 05/18/2020  WEIGHT 371 lb 370 lb 12.8 oz 374 lb  HEIGHT 5\' 9"  5\' 9"  5\' 9"   BMI 54.79 kg/m2 54.76 kg/m2 55.23 kg/m2  Some encounter information is confidential and restricted. Go to Review Flowsheets activity to see all data.

## 2020-10-04 ENCOUNTER — Ambulatory Visit (INDEPENDENT_AMBULATORY_CARE_PROVIDER_SITE_OTHER): Payer: Medicare Other | Admitting: Nurse Practitioner

## 2020-10-04 ENCOUNTER — Encounter: Payer: Self-pay | Admitting: Nurse Practitioner

## 2020-10-04 ENCOUNTER — Other Ambulatory Visit: Payer: Self-pay

## 2020-10-04 DIAGNOSIS — K047 Periapical abscess without sinus: Secondary | ICD-10-CM | POA: Insufficient documentation

## 2020-10-04 MED ORDER — AMOXICILLIN-POT CLAVULANATE 875-125 MG PO TABS: 1.0000 | ORAL_TABLET | Freq: Two times a day (BID) | ORAL | 0 refills | Status: DC

## 2020-10-04 NOTE — Progress Notes (Signed)
Acute Office Visit  Subjective:    Patient ID: Scott Mendez, male    DOB: March 30, 1981, 40 y.o.   MRN: [id number]  Chief Complaint  Patient presents with  . Jaw Pain    X 3 days     HPI Patient is in today for left lower jaw swelling x 3 days. He has upcoming appt with dentist. He is mentally challenged and is non-verbal, but he does respond to commands. His caregiver is with him today and states that he has been hurting for a few days d/t his jaw swelling.  Past Medical History:  Diagnosis Date  . Autism   . Hypertension    states under control with meds., has been on med. x 8 yr.  . Morbid obesity with BMI of 45.0-49.9, adult (HCC)   . Nonrestorable tooth 02/2018   x 2  . Nonverbal   . Sleep apnea    mild - no CPAP    Past Surgical History:  Procedure Laterality Date  . CYST EXCISION Right 03/03/2002   mandible  . MULTIPLE TOOTH EXTRACTIONS  03/03/2002   #1, 16, 17, 30, 32  . TOENAIL EXCISION Bilateral    great toe  . TOOTH EXTRACTION Right 09/03/2014   Procedure: EXTRACTION MOLAR LOWER RIGHT;  Surgeon: Georgia Lopes, DDS;  Location: [facility];  Service: Oral Surgery;  Laterality: Right;  . TOOTH EXTRACTION N/A 03/06/2018   Procedure: EXTRACTION X 1;  Surgeon: Ocie Doyne, DDS;  Location: [facility];  Service: Oral Surgery;  Laterality: N/A;    Family History  Problem Relation Age of Onset  . Hypertension Mother   . Hypertension Father   . Diabetes Father   . Heart disease Maternal Grandfather   . Diabetes Paternal Grandmother   . Seizures Maternal Uncle   . CVA Maternal Grandmother   . Heart disease Paternal Grandfather     Social History   Socioeconomic History  . Marital status: Single    Spouse name: N/A  . Number of children: 0  . Years of education: Not on file  .  Highest education level: Not on file  Occupational History  . Not on file  Tobacco Use  . Smoking status: Never Smoker  . Smokeless tobacco: Never Used  Vaping Use  . Vaping Use: Never used  Substance and Sexual Activity  . Alcohol use: No  . Drug use: No  . Sexual activity: Never  Other Topics Concern  . Not on file  Social History Narrative   Lives with Mother and sister    Social Determinants of Health   Financial Resource Strain: Low Risk   . Difficulty of Paying Living Expenses: Not hard at all  Food Insecurity: No Food Insecurity  . Worried About Programme researcher, broadcasting/film/video in the Last Year: Never true  . Ran Out of Food in the Last Year: Never true  Transportation Needs: No Transportation Needs  . Lack of Transportation (Medical): No  . Lack of Transportation (Non-Medical):  No  Physical Activity: Insufficiently Active  . Days of Exercise per Week: 4 days  . Minutes of Exercise per Session: 30 min  Stress: No Stress Concern Present  . Feeling of Stress : Not at all  Social Connections: Socially Isolated  . Frequency of Communication with Friends and Family: Never  . Frequency of Social Gatherings with Friends and Family: Twice a week  . Attends Religious Services: More than 4 times per year  . Active Member of Clubs or Organizations: No  . Attends Banker Meetings: Never  . Marital Status: Never married  Intimate Partner Violence: Not At Risk  . Fear of Current or Ex-Partner: No  . Emotionally Abused: No  . Physically Abused: No  . Sexually Abused: No    Outpatient Medications Prior to Visit  Medication Sig Dispense Refill  . furosemide (LASIX) 20 MG tablet Take 1 tablet (20 mg total) by mouth 2 (two) times daily. 60 tablet 1  . loratadine (CLARITIN) 10 MG tablet TAKE ONE TABLET BY MOUTH ONCE DAILY. 90 tablet 3  . montelukast (SINGULAIR) 10 MG tablet Take 1 tablet (10 mg total) by mouth at bedtime. 90 tablet 1  . olmesartan-hydrochlorothiazide (BENICAR  HCT) 20-12.5 MG tablet Take 1 tablet by mouth daily. 90 tablet 1  . potassium chloride SA (KLOR-CON M20) 20 MEQ tablet Take 1 tablet (20 mEq total) by mouth 2 (two) times daily. 180 tablet 1  . selenium sulfide (SELSUN) 1 % LOTN Wash  Hair daily with 12 cc shampoo for 3 weeks , then twice weekly 420 mL 3  . traZODone (DESYREL) 100 MG tablet Take 1 tablet (100 mg total) by mouth at bedtime. 90 tablet 1  . Vitamin D, Ergocalciferol, (DRISDOL) 1.25 MG (50000 UNIT) CAPS capsule TAKE 1 CAPSULE BY MOUTH ONCE A WEEK. 12 capsule 0   No facility-administered medications prior to visit.    No Known Allergies  Review of Systems  Constitutional: Negative.   HENT: Positive for dental problem and facial swelling.        Objective:    Physical Exam Constitutional:      Appearance: He is obese.  HENT:     Mouth/Throat:     Comments: Left jaw swelling that is tender to touch Neurological:     Mental Status: He is alert.     BP 138/90   Pulse (!) 108   Temp 97.6 F (36.4 C)   Resp 18   Ht 5\' 11"  (1.803 m)   Wt (!) 374 lb (169.6 kg)   SpO2 97%   BMI 52.16 kg/m  Wt Readings from Last 3 Encounters:  10/04/20 (!) 374 lb (169.6 kg)  06/23/20 (!) 371 lb (168.3 kg)  06/16/20 (!) 370 lb 12.8 oz (168.2 kg)    Health Maintenance Due  Topic Date Due  . Hepatitis C Screening  Never done  . COVID-19 Vaccine (3 - Booster for Moderna series) 07/07/2020    There are no preventive care reminders to display for this patient.   Lab Results  Component Value Date   TSH 1.81 11/04/2019   Lab Results  Component Value Date   WBC 8.1 11/04/2019   HGB 14.5 11/04/2019   HCT 43.9 11/04/2019   MCV 90.7 11/04/2019   PLT 291 11/04/2019   Lab Results  Component Value Date   NA 142 11/04/2019   K 4.4 11/04/2019   CO2 31 11/04/2019   GLUCOSE 88 11/04/2019   BUN 19 11/04/2019   CREATININE  1.01 11/04/2019   BILITOT 0.5 11/04/2019   ALKPHOS 85 07/07/2018   AST 30 11/04/2019   ALT 47 (H)  11/04/2019   PROT 6.6 11/04/2019   ALBUMIN 4.4 07/07/2018   CALCIUM 9.1 11/04/2019   ANIONGAP 8 07/07/2018   Lab Results  Component Value Date   CHOL 151 11/04/2019   Lab Results  Component Value Date   HDL 40 11/04/2019   Lab Results  Component Value Date   LDLCALC 92 11/04/2019   Lab Results  Component Value Date   TRIG 91 11/04/2019   Lab Results  Component Value Date   CHOLHDL 3.8 11/04/2019   Lab Results  Component Value Date   HGBA1C 5.3 11/04/2019       Assessment & Plan:   Problem List Items Addressed This Visit      Digestive   Periapical abscess    -Rx. augmentin -he has dental appointment upcoming      Relevant Medications   amoxicillin-clavulanate (AUGMENTIN) 875-125 MG tablet       Meds ordered this encounter  Medications  . amoxicillin-clavulanate (AUGMENTIN) 875-125 MG tablet    Sig: Take 1 tablet by mouth 2 (two) times daily.    Dispense:  14 tablet    Refill:  0     Heather Roberts, NP

## 2020-10-04 NOTE — Assessment & Plan Note (Signed)
-  Rx. augmentin -he has dental appointment upcoming

## 2020-10-25 ENCOUNTER — Other Ambulatory Visit: Payer: Self-pay | Admitting: Family Medicine

## 2020-10-25 ENCOUNTER — Telehealth: Payer: Self-pay

## 2020-10-25 ENCOUNTER — Other Ambulatory Visit: Payer: Self-pay

## 2020-10-25 DIAGNOSIS — E559 Vitamin D deficiency, unspecified: Secondary | ICD-10-CM

## 2020-10-25 MED ORDER — VITAMIN D (ERGOCALCIFEROL) 1.25 MG (50000 UNIT) PO CAPS: 50000.0000 [IU] | ORAL_CAPSULE | ORAL | 1 refills | Status: DC

## 2020-10-25 MED ORDER — FUROSEMIDE 20 MG PO TABS: 20.0000 mg | ORAL_TABLET | Freq: Two times a day (BID) | ORAL | 3 refills | Status: DC

## 2020-10-25 NOTE — Telephone Encounter (Signed)
[facility] [address]   lASIX 20 MG   Vit D

## 2020-10-25 NOTE — Telephone Encounter (Signed)
Refills sent

## 2020-11-07 DIAGNOSIS — I10 Essential (primary) hypertension: Secondary | ICD-10-CM | POA: Diagnosis not present

## 2020-11-07 DIAGNOSIS — E559 Vitamin D deficiency, unspecified: Secondary | ICD-10-CM | POA: Diagnosis not present

## 2020-11-07 DIAGNOSIS — Z1159 Encounter for screening for other viral diseases: Secondary | ICD-10-CM | POA: Diagnosis not present

## 2020-11-08 ENCOUNTER — Ambulatory Visit: Payer: Medicare Other | Admitting: Nurse Practitioner

## 2020-11-08 ENCOUNTER — Other Ambulatory Visit: Payer: Self-pay

## 2020-11-08 ENCOUNTER — Ambulatory Visit (INDEPENDENT_AMBULATORY_CARE_PROVIDER_SITE_OTHER): Payer: Medicare Other | Admitting: Nurse Practitioner

## 2020-11-08 ENCOUNTER — Encounter: Payer: Self-pay | Admitting: Nurse Practitioner

## 2020-11-08 ENCOUNTER — Ambulatory Visit: Payer: Medicare Other | Admitting: Family Medicine

## 2020-11-08 DIAGNOSIS — I10 Essential (primary) hypertension: Secondary | ICD-10-CM

## 2020-11-08 DIAGNOSIS — M7989 Other specified soft tissue disorders: Secondary | ICD-10-CM | POA: Diagnosis not present

## 2020-11-08 DIAGNOSIS — R7301 Impaired fasting glucose: Secondary | ICD-10-CM

## 2020-11-08 MED ORDER — OLMESARTAN MEDOXOMIL-HCTZ 40-12.5 MG PO TABS: 1.0000 | ORAL_TABLET | Freq: Every day | ORAL | 1 refills | Status: DC

## 2020-11-08 NOTE — Assessment & Plan Note (Signed)
BP Readings from Last 3 Encounters:  11/08/20 (!) 148/87  10/04/20 138/90  06/23/20 132/83   Wt Readings from Last 3 Encounters:  11/08/20 (!) 370 lb (167.8 kg)  10/04/20 (!) 374 lb (169.6 kg)  06/23/20 (!) 371 lb (168.3 kg)  -weight is stable from previous  -INCREASE olmesartan-HCTZ

## 2020-11-08 NOTE — Assessment & Plan Note (Signed)
-  will check A1c with next set of labs -glucose was only slightly elevated

## 2020-11-08 NOTE — Assessment & Plan Note (Signed)
-  saw PT and has manual drainage -he is still wearing compression stockings -still has some swelling, but no evidence of cellulitis -weight loss would likely improve this to some degree

## 2020-11-08 NOTE — Progress Notes (Signed)
Acute Office Visit  Subjective:    Patient ID: Scott Mendez, male    DOB: 1981-03-22, 40 y.o.   MRN: [id number]  Chief Complaint  Patient presents with  . Hypertension  . Edema    HPI Patient is in today for lymphedema. He saw PT in the past for manual lymphatic drainage of his right leg, and he and his mother were educated with respect to the use of compression stocking and home exercises to increase lymphatic circulation.  He also has a hx of HTN. His mother states that his home BP readings have been elevated.    He had fasting labs drawn yesterday, reviewed today. Cholesterol looks great. LFTs are stable, and GFR is WNL.  TSH is WNL as well.  Past Medical History:  Diagnosis Date  . Autism   . Hypertension    states under control with meds., has been on med. x 8 yr.  . Morbid obesity with BMI of 45.0-49.9, adult (HCC)   . Nonrestorable tooth 02/2018   x 2  . Nonverbal   . Sleep apnea    mild - no CPAP    Past Surgical History:  Procedure Laterality Date  . CYST EXCISION Right 03/03/2002   mandible  . MULTIPLE TOOTH EXTRACTIONS  03/03/2002   #1, 16, 17, 30, 32  . TOENAIL EXCISION Bilateral    great toe  . TOOTH EXTRACTION Right 09/03/2014   Procedure: EXTRACTION MOLAR LOWER RIGHT;  Surgeon: Georgia Lopes, DDS;  Location: [facility];  Service: Oral Surgery;  Laterality: Right;  . TOOTH EXTRACTION N/A 03/06/2018   Procedure: EXTRACTION X 1;  Surgeon: Ocie Doyne, DDS;  Location: [facility];  Service: Oral Surgery;  Laterality: N/A;    Family History  Problem Relation Age of Onset  . Hypertension Mother   . Hypertension Father   . Diabetes Father   . Heart disease Maternal Grandfather   . Diabetes Paternal Grandmother   . Seizures Maternal Uncle   . CVA Maternal Grandmother   . Heart disease Paternal Grandfather     Social History   Socioeconomic History  . Marital status: Single    Spouse name: N/A  . Number of children: 0  . Years  of education: Not on file  . Highest education level: Not on file  Occupational History  . Not on file  Tobacco Use  . Smoking status: Never Smoker  . Smokeless tobacco: Never Used  Vaping Use  . Vaping Use: Never used  Substance and Sexual Activity  . Alcohol use: No  . Drug use: No  . Sexual activity: Never  Other Topics Concern  . Not on file  Social History Narrative   Lives with Mother and sister    Social Determinants of Health   Financial Resource Strain: Low Risk   . Difficulty of Paying Living Expenses: Not hard at all  Food Insecurity: No Food Insecurity  . Worried About Programme researcher, broadcasting/film/video in the Last Year: Never true  . Ran Out of Food in the Last Year: Never true  Transportation Needs: No Transportation Needs  . Lack of Transportation (Medical): No  . Lack of Transportation (Non-Medical): No  Physical Activity: Insufficiently Active  . Days of Exercise per Week: 4 days  . Minutes of Exercise per Session: 30 min  Stress: No Stress Concern Present  . Feeling of Stress : Not at all  Social Connections: Socially Isolated  . Frequency of Communication with Friends and  Family: Never  . Frequency of Social Gatherings with Friends and Family: Twice a week  . Attends Religious Services: More than 4 times per year  . Active Member of Clubs or Organizations: No  . Attends Banker Meetings: Never  . Marital Status: Never married  Intimate Partner Violence: Not At Risk  . Fear of Current or Ex-Partner: No  . Emotionally Abused: No  . Physically Abused: No  . Sexually Abused: No    Outpatient Medications Prior to Visit  Medication Sig Dispense Refill  . furosemide (LASIX) 20 MG tablet Take 1 tablet (20 mg total) by mouth 2 (two) times daily. 60 tablet 3  . KLOR-CON M20 20 MEQ tablet TAKE 1 TABLET BY MOUTH TWICE A DAY 180 tablet 1  . loratadine (CLARITIN) 10 MG tablet TAKE ONE TABLET BY MOUTH ONCE DAILY. 90 tablet 3  . montelukast (SINGULAIR) 10 MG  tablet Take 1 tablet (10 mg total) by mouth at bedtime. 90 tablet 1  . selenium sulfide (SELSUN) 1 % LOTN Wash  Hair daily with 12 cc shampoo for 3 weeks , then twice weekly 420 mL 3  . traZODone (DESYREL) 100 MG tablet Take 1 tablet (100 mg total) by mouth at bedtime. 90 tablet 1  . Vitamin D, Ergocalciferol, (DRISDOL) 1.25 MG (50000 UNIT) CAPS capsule Take 1 capsule (50,000 Units total) by mouth once a week. 12 capsule 1  . olmesartan-hydrochlorothiazide (BENICAR HCT) 20-12.5 MG tablet Take 1 tablet by mouth daily. 90 tablet 1  . amoxicillin-clavulanate (AUGMENTIN) 875-125 MG tablet Take 1 tablet by mouth 2 (two) times daily. 14 tablet 0   No facility-administered medications prior to visit.    No Known Allergies  Review of Systems  Constitutional: Negative.   Respiratory: Negative.   Cardiovascular: Negative for chest pain and palpitations.       Right leg swelling       Objective:    Physical Exam Constitutional:      Appearance: He is obese.  Cardiovascular:     Rate and Rhythm: Normal rate and regular rhythm.     Pulses: Normal pulses.     Heart sounds: Normal heart sounds.  Pulmonary:     Effort: Pulmonary effort is normal.     Breath sounds: Normal breath sounds.  Musculoskeletal:        General: Swelling present.     Comments: To right lower leg  Neurological:     Mental Status: He is alert.  Psychiatric:     Comments: Non-verbal on exam today     BP (!) 148/87   Pulse (!) 102   Temp 97.9 F (36.6 C)   Resp (!) 22   Ht 5\' 11"  (1.803 m)   Wt (!) 370 lb (167.8 kg)   SpO2 95%   BMI 51.60 kg/m  Wt Readings from Last 3 Encounters:  11/08/20 (!) 370 lb (167.8 kg)  10/04/20 (!) 374 lb (169.6 kg)  06/23/20 (!) 371 lb (168.3 kg)    Health Maintenance Due  Topic Date Due  . Hepatitis C Screening  Never done  . COVID-19 Vaccine (3 - Booster for Moderna series) 07/07/2020    There are no preventive care reminders to display for this patient.   Lab  Results  Component Value Date   TSH 2.630 11/07/2020   Lab Results  Component Value Date   WBC 8.4 11/07/2020   HGB 14.6 11/07/2020   HCT 43.8 11/07/2020   MCV 88 11/07/2020   PLT  291 11/07/2020   Lab Results  Component Value Date   NA 143 11/07/2020   K 4.2 11/07/2020   CO2 24 11/07/2020   GLUCOSE 101 (H) 11/07/2020   BUN 23 (H) 11/07/2020   CREATININE 1.10 11/07/2020   BILITOT 0.4 11/07/2020   ALKPHOS 114 11/07/2020   AST 36 11/07/2020   ALT 46 (H) 11/07/2020   PROT 6.8 11/07/2020   ALBUMIN 4.3 11/07/2020   CALCIUM 9.1 11/07/2020   ANIONGAP 8 07/07/2018   Lab Results  Component Value Date   CHOL 142 11/07/2020   Lab Results  Component Value Date   HDL 37 (L) 11/07/2020   Lab Results  Component Value Date   LDLCALC 82 11/07/2020   Lab Results  Component Value Date   TRIG 127 11/07/2020   Lab Results  Component Value Date   CHOLHDL 3.8 11/07/2020   Lab Results  Component Value Date   HGBA1C 5.3 11/04/2019       Assessment & Plan:   Problem List Items Addressed This Visit      Cardiovascular and Mediastinum   Essential hypertension    BP Readings from Last 3 Encounters:  11/08/20 (!) 148/87  10/04/20 138/90  06/23/20 132/83   Wt Readings from Last 3 Encounters:  11/08/20 (!) 370 lb (167.8 kg)  10/04/20 (!) 374 lb (169.6 kg)  06/23/20 (!) 371 lb (168.3 kg)  -weight is stable from previous  -INCREASE olmesartan-HCTZ       Relevant Medications   olmesartan-hydrochlorothiazide (BENICAR HCT) 40-12.5 MG tablet     Endocrine   Impaired fasting glucose    -will check A1c with next set of labs -glucose was only slightly elevated        Other   Morbid obesity (HCC)    BMI Readings from Last 3 Encounters:  11/08/20 51.60 kg/m  10/04/20 52.16 kg/m  06/23/20 54.79 kg/m  -TSH is WNL      Right leg swelling    -saw PT and has manual drainage -he is still wearing compression stockings -still has some swelling, but no evidence of  cellulitis -weight loss would likely improve this to some degree          Meds ordered this encounter  Medications  . olmesartan-hydrochlorothiazide (BENICAR HCT) 40-12.5 MG tablet    Sig: Take 1 tablet by mouth daily.    Dispense:  30 tablet    Refill:  1    Increased dose today     Heather Roberts, NP

## 2020-11-08 NOTE — Assessment & Plan Note (Signed)
BMI Readings from Last 3 Encounters:  11/08/20 51.60 kg/m  10/04/20 52.16 kg/m  06/23/20 54.79 kg/m  -TSH is WNL

## 2020-11-09 LAB — CBC
Hematocrit: 43.8 % (ref 37.5–51.0)
Hemoglobin: 14.6 g/dL (ref 13.0–17.7)
MCH: 29.4 pg (ref 26.6–33.0)
MCHC: 33.3 g/dL (ref 31.5–35.7)
MCV: 88 fL (ref 79–97)
Platelets: 291 10*3/uL (ref 150–450)
RBC: 4.97 x10E6/uL (ref 4.14–5.80)
RDW: 11.9 % (ref 11.6–15.4)
WBC: 8.4 10*3/uL (ref 3.4–10.8)

## 2020-11-09 LAB — LIPID PANEL
Chol/HDL Ratio: 3.8 ratio (ref 0.0–5.0)
Cholesterol, Total: 142 mg/dL (ref 100–199)
HDL: 37 mg/dL — ABNORMAL LOW (ref >39)
LDL Chol Calc (NIH): 82 mg/dL (ref 0–99)
Triglycerides: 127 mg/dL (ref 0–149)
VLDL Cholesterol Cal: 23 mg/dL (ref 5–40)

## 2020-11-09 LAB — CMP14+EGFR
ALT: 46 IU/L — ABNORMAL HIGH (ref 0–44)
AST: 36 IU/L (ref 0–40)
Albumin/Globulin Ratio: 1.7 (ref 1.2–2.2)
Albumin: 4.3 g/dL (ref 4.0–5.0)
Alkaline Phosphatase: 114 IU/L (ref 44–121)
BUN/Creatinine Ratio: 21 — ABNORMAL HIGH (ref 9–20)
BUN: 23 mg/dL — ABNORMAL HIGH (ref 6–20)
Bilirubin Total: 0.4 mg/dL (ref 0.0–1.2)
CO2: 24 mmol/L (ref 20–29)
Calcium: 9.1 mg/dL (ref 8.7–10.2)
Chloride: 102 mmol/L (ref 96–106)
Creatinine, Ser: 1.1 mg/dL (ref 0.76–1.27)
Globulin, Total: 2.5 g/dL (ref 1.5–4.5)
Glucose: 101 mg/dL — ABNORMAL HIGH (ref 65–99)
Potassium: 4.2 mmol/L (ref 3.5–5.2)
Sodium: 143 mmol/L (ref 134–144)
Total Protein: 6.8 g/dL (ref 6.0–8.5)
eGFR: 89 mL/min/{1.73_m2} (ref >59)

## 2020-11-09 LAB — TSH: TSH: 2.63 u[IU]/mL (ref 0.450–4.500)

## 2020-11-09 LAB — HEPATITIS C ANTIBODY: Hep C Virus Ab: <0.1 s/co ratio (ref 0.0–0.9)

## 2020-11-09 LAB — VITAMIN D 25 HYDROXY (VIT D DEFICIENCY, FRACTURES): Vit D, 25-Hydroxy: 35.3 ng/mL (ref 30.0–100.0)

## 2020-12-06 ENCOUNTER — Encounter: Payer: Self-pay | Admitting: Family Medicine

## 2020-12-06 ENCOUNTER — Other Ambulatory Visit: Payer: Self-pay

## 2020-12-06 ENCOUNTER — Ambulatory Visit (INDEPENDENT_AMBULATORY_CARE_PROVIDER_SITE_OTHER): Payer: Medicare Other | Admitting: Family Medicine

## 2020-12-06 VITALS — BP 134/82 | HR 98 | Resp 16 | Ht 71.0 in | Wt 370.0 lb

## 2020-12-06 DIAGNOSIS — I10 Essential (primary) hypertension: Secondary | ICD-10-CM

## 2020-12-06 DIAGNOSIS — M7989 Other specified soft tissue disorders: Secondary | ICD-10-CM

## 2020-12-06 DIAGNOSIS — F5104 Psychophysiologic insomnia: Secondary | ICD-10-CM

## 2020-12-06 NOTE — Assessment & Plan Note (Signed)
Controlled, no change in medication DASH diet and commitment to daily physical activity for a minimum of 30 minutes discussed and encouraged, as a part of hypertension management. The importance of attaining a healthy weight is also discussed.  BP/Weight 12/06/2020 11/08/2020 10/04/2020 06/23/2020 06/16/2020 05/18/2020 05/11/2020  Systolic BP 134 148 138 132 132 135 134  Diastolic BP 82 87 90 83 83 84 80  Wt. (Lbs) 370 370 374 371 370.8 374 364.04  BMI 51.6 51.6 52.16 54.79 54.76 55.23 48.03  Some encounter information is confidential and restricted. Go to Review Flowsheets activity to see all data.

## 2020-12-06 NOTE — Assessment & Plan Note (Signed)
  Patient re-educated about  the importance of commitment to a  minimum of 150 minutes of exercise per week as able.  The importance of healthy food choices with portion control discussed, as well as eating regularly and within a 12 hour window most days. The need to choose "clean , green" food 50 to 75% of the time is discussed, as well as to make water the primary drink and set a goal of 64 ounces water daily.    Weight /BMI 12/06/2020 11/08/2020 10/04/2020  WEIGHT 370 lb 370 lb 374 lb  HEIGHT 5\' 11"  5\' 11"  5\' 11"   BMI 51.6 kg/m2 51.6 kg/m2 52.16 kg/m2  Some encounter information is confidential and restricted. Go to Review Flowsheets activity to see all data.

## 2020-12-06 NOTE — Assessment & Plan Note (Signed)
Chronic, stable, wearing support hose

## 2020-12-06 NOTE — Assessment & Plan Note (Signed)
Controlled, no change in medication  

## 2020-12-06 NOTE — Patient Instructions (Signed)
F/U in 5 months, flu vaccine at visit, call if you need me before   No change in blood pressure medication, it is good    It is important that you exercise regularly at least 30 minutes 5 times a week. If you develop chest pain, have severe difficulty breathing, or feel very tired, stop exercising immediately and seek medical attention  Think about what you will eat, plan ahead. Choose " clean, green, fresh or frozen" over canned, processed or packaged foods which are more sugary, salty and fatty. 70 to 75% of food eaten should be vegetables and fruit. Three meals at set times with snacks allowed between meals, but they must be fruit or vegetables. Aim to eat over a 12 hour period , example 7 am to 7 pm, and STOP after  your last meal of the day. Drink water,generally about 64 ounces per day, no other drink is as healthy. Fruit juice is best enjoyed in a healthy way, by EATING the fruit. Thanks for choosing [facility], we consider it a privelige to serve you.

## 2020-12-06 NOTE — Progress Notes (Signed)
   Scott Mendez     MRN: [id number]      DOB: Feb 27, 1981   HPI Scott Mendez is here for follow up and re-evaluation of chronic medical conditions, medication management and review of any available recent lab and radiology data.  Preventive health is updated, specifically  Cancer screening and Immunization.   Questions or concerns regarding consultations or procedures which the PT has had in the interim are  addressed. The PT denies any adverse reactions to current medications since the last visit.  There are no new concerns.  There are no specific complaints   ROS Mother is historian Denies recent fever or chills. Denies sinus pressure, nasal congestion, ear pain or sore throat. Denies chest congestion, productive cough or wheezing. Denies chest pains, palpitations  Denies abdominal pain, nausea, vomiting,diarrhea or constipation.   Denies dysuria, frequency, hesitancy or incontinence.  Denies headaches, seizures, numbness, or tingling. Denies uncontrolled depression, anxiety or insomnia. Denies skin break down or rash.   PE  BP 134/82   Pulse 98   Resp 16   Ht 5\' 11"  (1.803 m)   Wt (!) 370 lb (167.8 kg)   SpO2 93%   BMI 51.60 kg/m   Patient alert and oriented and in no cardiopulmonary distress.  HEENT: No facial asymmetry, EOMI,     Neck supple .  Chest: Clear to auscultation bilaterally.  CVS: S1, S2 no murmurs, no S3.Regular rate.  ABD: Soft non tender.   Ext: one plus edema RLE  MS: Adequate ROM spine, shoulders, hips and knees.  Skin: Intact, no ulcerations or rash noted.    CNS: CN 2-12 intact, power,  normal throughout.no focal deficits noted.   Assessment & Plan  Essential hypertension Controlled, no change in medication DASH diet and commitment to daily physical activity for a minimum of 30 minutes discussed and encouraged, as a part of hypertension management. The importance of attaining a healthy weight is also discussed.  BP/Weight  12/06/2020 11/08/2020 10/04/2020 06/23/2020 06/16/2020 05/18/2020 05/11/2020  Systolic BP 134 148 138 132 132 135 134  Diastolic BP 82 87 90 83 83 84 80  Wt. (Lbs) 370 370 374 371 370.8 374 364.04  BMI 51.6 51.6 52.16 54.79 54.76 55.23 48.03  Some encounter information is confidential and restricted. Go to Review Flowsheets activity to see all data.       Morbid obesity  Patient re-educated about  the importance of commitment to a  minimum of 150 minutes of exercise per week as able.  The importance of healthy food choices with portion control discussed, as well as eating regularly and within a 12 hour window most days. The need to choose "clean , green" food 50 to 75% of the time is discussed, as well as to make water the primary drink and set a goal of 64 ounces water daily.    Weight /BMI 12/06/2020 11/08/2020 10/04/2020  WEIGHT 370 lb 370 lb 374 lb  HEIGHT 5\' 11"  5\' 11"  5\' 11"   BMI 51.6 kg/m2 51.6 kg/m2 52.16 kg/m2  Some encounter information is confidential and restricted. Go to Review Flowsheets activity to see all data.      Insomnia Controlled, no change in medication   Right leg swelling Chronic, stable, wearing support hose

## 2020-12-16 ENCOUNTER — Other Ambulatory Visit: Payer: Self-pay

## 2020-12-16 ENCOUNTER — Encounter (HOSPITAL_COMMUNITY): Payer: Self-pay | Admitting: Oral Surgery

## 2020-12-16 ENCOUNTER — Other Ambulatory Visit (HOSPITAL_COMMUNITY)
Admission: RE | Admit: 2020-12-16 | Discharge: 2020-12-16 | Disposition: A | Discharge status: Home / Self Care | Payer: Medicare Other | Source: Ambulatory Visit | Attending: Oral Surgery | Admitting: Oral Surgery

## 2020-12-16 DIAGNOSIS — Z20822 Contact with and (suspected) exposure to covid-19: Secondary | ICD-10-CM | POA: Diagnosis not present

## 2020-12-16 DIAGNOSIS — Z01812 Encounter for preprocedural laboratory examination: Secondary | ICD-10-CM | POA: Insufficient documentation

## 2020-12-16 NOTE — Progress Notes (Addendum)
I spoke to [facility], Jeffrey's mother and legal guardian. I asked Antuna Whedbee to bring Guardianship papers to the hospital.with him. Mr. Mckennon Zwart is on the Autism Spectrum, patient is non verbal, patient follows directions, and patient successfully toilets.  Per [facility] does not appear to have any pain and is not short of breath.  [facility] has lower extremity edema, he is on Lasix and wears TED hose.  I asked anesthesia PA-C to evaluate.

## 2020-12-17 LAB — SARS CORONAVIRUS 2 (TAT 6-24 HRS): SARS Coronavirus 2: NEGATIVE

## 2020-12-19 ENCOUNTER — Other Ambulatory Visit: Payer: Self-pay | Admitting: Family Medicine

## 2020-12-19 MED ORDER — CEFAZOLIN IN SODIUM CHLORIDE 3-0.9 GM/100ML-% IV SOLN
3.0000 g | INTRAVENOUS | Status: DC
  Filled 2020-12-19 (×2): qty 100

## 2020-12-19 NOTE — H&P (Signed)
HISTORY AND PHYSICAL  Scott Mendez is a 40 y.o. male patient referred by general dentist for multiple extractions.   No diagnosis found.  Past Medical History:  Diagnosis Date  . Autism   . Hypertension    states under control with meds., has been on med. x 8 yr.  . Morbid obesity with BMI of 45.0-49.9, adult ([facility])   . Nonrestorable tooth 02/2018   x 2  . Nonverbal   . Sleep apnea    mild - no CPAP    No current facility-administered medications for this encounter.   Current Outpatient Medications  Medication Sig Dispense Refill  . acetaminophen (TYLENOL) 500 MG tablet Take 1,000 mg by mouth every 6 (six) hours as needed for moderate pain or headache.    . furosemide (LASIX) 20 MG tablet Take 1 tablet (20 mg total) by mouth 2 (two) times daily. 60 tablet 3  . KLOR-CON M20 20 MEQ tablet TAKE 1 TABLET BY MOUTH TWICE A DAY (Patient taking differently: Take 20 mEq by mouth 2 (two) times daily.) 180 tablet 1  . loratadine (CLARITIN) 10 MG tablet TAKE ONE TABLET BY MOUTH ONCE DAILY. (Patient taking differently: Take 10 mg by mouth daily.) 90 tablet 3  . montelukast (SINGULAIR) 10 MG tablet Take 1 tablet (10 mg total) by mouth at bedtime. 90 tablet 1  . olmesartan-hydrochlorothiazide (BENICAR HCT) 40-12.5 MG tablet Take 1 tablet by mouth daily. 30 tablet 1  . selenium sulfide (SELSUN) 1 % LOTN Wash  Hair daily with 12 cc shampoo for 3 weeks , then twice weekly (Patient taking differently: Apply 1 application topically 2 (two) times a week. Wash hair with 12 cc) 420 mL 3  . traZODone (DESYREL) 100 MG tablet Take 1 tablet (100 mg total) by mouth at bedtime. 90 tablet 1  . Vitamin D, Ergocalciferol, (DRISDOL) 1.25 MG (50000 UNIT) CAPS capsule Take 1 capsule (50,000 Units total) by mouth once a week. (Patient taking differently: Take 50,000 Units by mouth every Thursday.) 12 capsule 1   No Known Allergies Active Problems:   * No active hospital problems. *  Vitals: There were no  vitals taken for this visit. Lab results:No results found for this or any previous visit (from the past 24 hour(s)). Radiology Results: No results found. General appearance: alert, cooperative, morbidly obese and slowed mentation Head: Normocephalic, without obvious abnormality, atraumatic Eyes: negative Nose: Nares normal. Septum midline. Mucosa normal. No drainage or sinus tenderness. Throat: Dental caries # 13, 19. No purulenec, edema, fluctuance. Pharynx clear.  Neck: no adenopathy, supple, symmetrical, trachea midline and thyroid not enlarged, symmetric, no tenderness/mass/nodules Resp: clear to auscultation bilaterally Cardio: regular rate and rhythm, S1, S2 normal, no murmur, click, rub or gallop  Assessment: 41 Yo Male,  Autism, Morbid obesity, HTN, Sleep apnea with carious teeth.  Plan:Dental extractions with GA. Day surgery.   Ocie Doyne 12/19/2020

## 2020-12-19 NOTE — Progress Notes (Signed)
Anesthesia Chart Review:  Case: [id number] Date/Time: 12/20/20 1142   Procedure: DENTAL RESTORATION/EXTRACTIONS (N/A )   Anesthesia type: General   Pre-op diagnosis: NON RESTORABLE   [facility] / [facility]   Surgeons: Ocie Doyne, DMD      DISCUSSION: Patient is a 40 year old male scheduled for the above procedure.  History includes never smoker, autism (non-verbal), HTN, morbid obesity, OSA ("mild", no CPAP), dental surgery (03/03/02, 08/24/14, 03/06/18, 05/19/18). Per PAT RN communication, Scott can follow directions and toilets himself. He wears TED hose and takes diuretics for chronic RLE edema--negative RLE DVT scan 06/2020.  12/16/2020 preprocedure COVID-19 test negative.  Anesthesia team to evaluate on the day of surgery.  Last labs noted are from 11/07/2020 and can be viewed in [facility].   VS:   Wt Readings from Last 3 Encounters:  12/06/20 (!) 167.8 kg  11/08/20 (!) 167.8 kg  10/04/20 (!) 169.6 kg   BP Readings from Last 3 Encounters:  12/06/20 134/82  11/08/20 (!) 148/87  10/04/20 138/90   Pulse Readings from Last 3 Encounters:  12/06/20 98  11/08/20 (!) 102  10/04/20 (!) 108    PROVIDERS: Kerri Perches, MD is PCP    LABS: Labs as of 11/07/20 include: Lab Results  Component Value Date   WBC 8.4 11/07/2020   HGB 14.6 11/07/2020   HCT 43.8 11/07/2020   PLT 291 11/07/2020   GLUCOSE 101 (H) 11/07/2020   CHOL 142 11/07/2020   TRIG 127 11/07/2020   HDL 37 (L) 11/07/2020   LDLCALC 82 11/07/2020   ALT 46 (H) 11/07/2020   AST 36 11/07/2020   NA 143 11/07/2020   K 4.2 11/07/2020   CL 102 11/07/2020   CREATININE 1.10 11/07/2020   BUN 23 (H) 11/07/2020   CO2 24 11/07/2020   TSH 2.630 11/07/2020   HGBA1C 5.3 11/04/2019    OTHER: Sleep Study 10/30/17: STUDY RESULTS: Total Recording Time: 5 h, 13 m. Total valid test time 3 h 58 min. Total Apnea/Hypopnea Index (AHI) was 17.5 /h; RDI was 18.3 /h Average Oxygen Saturation: 94 %, Lowest Oxygen  Desaturation measured at 54 % [likley artifact]. Total Time in Oxygen Saturation below 89% was 9.2 minutes. Average Heart Rate: 84 bpm (between 44 and 117 bpm), NSR.  IMPRESSION:  1) Mild sleep apnea with moderate- loud snoring, no prolonged hypoxemia.  2) tachy-brady arrhythmia.  RECOMMENDATION:  This apnea is still mild and may further improve with weight loss.  It is however necessary to treat: If Scott Mendez can tolerate CPAP, I would like to try auto PAP with him. A dental device is an alternative option but requires cooperation, too. If this is unlikely, I would ask him to sleep on his side and concentrate on weight loss.    EKG: 03/03/18: NSR   CV: RLE Venous Duplex 06/16/20: Summary:  Right:  - No evidence of obvious deep vein thrombosis seen in the right lower  extremity, from the common femoral through the popliteal veins.  - Venous reflux is noted in the right popliteal vein.  - Venous reflux is noted in the right short saphenous vein at the  popliteal fossa.     Past Medical History:  Diagnosis Date  . Autism   . Hypertension    states under control with meds., has been on med. x 8 yr.  . Morbid obesity with BMI of 45.0-49.9, adult (HCC)   . Nonrestorable tooth 02/2018   x 2  .  Nonverbal   . Sleep apnea    mild - no CPAP    Past Surgical History:  Procedure Laterality Date  . CYST EXCISION Right 03/03/2002   mandible  . MULTIPLE TOOTH EXTRACTIONS  03/03/2002   #1, 16, 17, 30, 32  . TOENAIL EXCISION Bilateral    great toe  . TOOTH EXTRACTION Right 09/03/2014   Procedure: EXTRACTION MOLAR LOWER RIGHT;  Surgeon: Georgia Lopes, DDS;  [facility];  Service: Oral Surgery;  Laterality: Right;  . TOOTH EXTRACTION N/A 03/06/2018   Procedure: EXTRACTION X 1;  Surgeon: Ocie Doyne, DDS;  Location: [facility];  Service: Oral Surgery;  Laterality: N/A;    MEDICATIONS: No current facility-administered medications for this encounter.   Marland Kitchen  acetaminophen (TYLENOL) 500 MG tablet  . furosemide (LASIX) 20 MG tablet  . KLOR-CON M20 20 MEQ tablet  . montelukast (SINGULAIR) 10 MG tablet  . olmesartan-hydrochlorothiazide (BENICAR HCT) 40-12.5 MG tablet  . selenium sulfide (SELSUN) 1 % LOTN  . traZODone (DESYREL) 100 MG tablet  . Vitamin D, Ergocalciferol, (DRISDOL) 1.25 MG (50000 UNIT) CAPS capsule  . loratadine (CLARITIN) 10 MG tablet    Shonna Chock, PA-C Surgical Short Stay/Anesthesiology [facility] Phone [phone] [facility] Phone [phone] 12/19/2020 1:03 PM

## 2020-12-19 NOTE — Anesthesia Preprocedure Evaluation (Addendum)
Anesthesia Evaluation  Patient identified by MRN, date of birth, ID band Patient awake    Reviewed: Allergy & Precautions, NPO status , Patient's Chart, lab work & pertinent test results  Airway Mallampati: III  TM Distance: >3 FB Neck ROM: Full    Dental  (+) Dental Advisory Given   Pulmonary sleep apnea ,    breath sounds clear to auscultation       Cardiovascular hypertension, Pt. on medications  Rhythm:Regular Rate:Normal     Neuro/Psych PSYCHIATRIC DISORDERS (autistic) negative neurological ROS     GI/Hepatic negative GI ROS, Neg liver ROS,   Endo/Other  negative endocrine ROS  Renal/GU negative Renal ROS     Musculoskeletal   Abdominal   Peds  Hematology negative hematology ROS (+)   Anesthesia Other Findings   Reproductive/Obstetrics                            Anesthesia Physical Anesthesia Plan  ASA: III  Anesthesia Plan: General   Post-op Pain Management:    Induction: Intravenous  PONV Risk Score and Plan: 2 and Dexamethasone, Ondansetron and Treatment may vary due to age or medical condition  Airway Management Planned: Nasal ETT  Additional Equipment: None  Intra-op Plan:   Post-operative Plan: Extubation in OR  Informed Consent: I have reviewed the patients History and Physical, chart, labs and discussed the procedure including the risks, benefits and alternatives for the proposed anesthesia with the patient or authorized representative who has indicated his/her understanding and acceptance.     Dental advisory given  Plan Discussed with: CRNA  Anesthesia Plan Comments:        Anesthesia Quick Evaluation

## 2020-12-20 ENCOUNTER — Ambulatory Visit (HOSPITAL_COMMUNITY): Payer: Medicare Other | Admitting: Vascular Surgery

## 2020-12-20 ENCOUNTER — Encounter (HOSPITAL_COMMUNITY): Payer: Self-pay | Admitting: Oral Surgery

## 2020-12-20 ENCOUNTER — Ambulatory Visit (HOSPITAL_COMMUNITY)
Admission: RE | Admit: 2020-12-20 | Discharge: 2020-12-20 | Disposition: A | Discharge status: Home / Self Care | Payer: Medicare Other | Attending: Oral Surgery | Admitting: Oral Surgery

## 2020-12-20 ENCOUNTER — Encounter (HOSPITAL_COMMUNITY)
Admission: RE | Disposition: A | Discharge status: Home / Self Care | Payer: Self-pay | Source: Home / Self Care | Attending: Oral Surgery

## 2020-12-20 ENCOUNTER — Other Ambulatory Visit: Payer: Self-pay

## 2020-12-20 DIAGNOSIS — K029 Dental caries, unspecified: Secondary | ICD-10-CM | POA: Diagnosis not present

## 2020-12-20 DIAGNOSIS — K0889 Other specified disorders of teeth and supporting structures: Secondary | ICD-10-CM | POA: Diagnosis not present

## 2020-12-20 DIAGNOSIS — F840 Autistic disorder: Secondary | ICD-10-CM | POA: Diagnosis not present

## 2020-12-20 DIAGNOSIS — I10 Essential (primary) hypertension: Secondary | ICD-10-CM | POA: Diagnosis not present

## 2020-12-20 DIAGNOSIS — G4730 Sleep apnea, unspecified: Secondary | ICD-10-CM | POA: Diagnosis not present

## 2020-12-20 DIAGNOSIS — Z79899 Other long term (current) drug therapy: Secondary | ICD-10-CM | POA: Diagnosis not present

## 2020-12-20 DIAGNOSIS — Z6841 Body Mass Index (BMI) 40.0 and over, adult: Secondary | ICD-10-CM | POA: Insufficient documentation

## 2020-12-20 HISTORY — PX: TOOTH EXTRACTION: SHX859

## 2020-12-20 SURGERY — DENTAL RESTORATION/EXTRACTIONS
Anesthesia: General | Site: Mouth

## 2020-12-20 MED ORDER — PHENYLEPHRINE 40 MCG/ML (10ML) SYRINGE FOR IV PUSH (FOR BLOOD PRESSURE SUPPORT)
PREFILLED_SYRINGE | INTRAVENOUS | Status: AC
  Filled 2020-12-20: qty 10

## 2020-12-20 MED ORDER — LIDOCAINE-EPINEPHRINE 2 %-1:100000 IJ SOLN
INTRAMUSCULAR | Status: DC | PRN
  Administered 2020-12-20: 8 mL via INTRADERMAL

## 2020-12-20 MED ORDER — DEXTROSE 5 % IV SOLN
INTRAVENOUS | Status: DC | PRN
  Administered 2020-12-20: 3 g via INTRAVENOUS

## 2020-12-20 MED ORDER — SUGAMMADEX SODIUM 200 MG/2ML IV SOLN
INTRAVENOUS | Status: DC | PRN
  Administered 2020-12-20: 200 mg via INTRAVENOUS

## 2020-12-20 MED ORDER — SODIUM CHLORIDE 0.9 % IR SOLN
Status: DC | PRN
  Administered 2020-12-20: 1000 mL

## 2020-12-20 MED ORDER — DEXAMETHASONE SODIUM PHOSPHATE 10 MG/ML IJ SOLN
INTRAMUSCULAR | Status: DC | PRN
  Administered 2020-12-20: 10 mg via INTRAVENOUS

## 2020-12-20 MED ORDER — DEXAMETHASONE SODIUM PHOSPHATE 10 MG/ML IJ SOLN
INTRAMUSCULAR | Status: AC
  Filled 2020-12-20: qty 1

## 2020-12-20 MED ORDER — FENTANYL CITRATE (PF) 250 MCG/5ML IJ SOLN
INTRAMUSCULAR | Status: AC
  Filled 2020-12-20: qty 5

## 2020-12-20 MED ORDER — MIDAZOLAM HCL 2 MG/2ML IJ SOLN
INTRAMUSCULAR | Status: AC
  Filled 2020-12-20: qty 2

## 2020-12-20 MED ORDER — MIDAZOLAM HCL 2 MG/2ML IJ SOLN
INTRAMUSCULAR | Status: DC | PRN
  Administered 2020-12-20: 2 mg via INTRAVENOUS

## 2020-12-20 MED ORDER — ORAL CARE MOUTH RINSE: 15.0000 mL | Freq: Once | OROMUCOSAL | Status: AC

## 2020-12-20 MED ORDER — ONDANSETRON HCL 4 MG/2ML IJ SOLN
INTRAMUSCULAR | Status: AC
  Filled 2020-12-20: qty 2

## 2020-12-20 MED ORDER — PROPOFOL 10 MG/ML IV BOLUS
INTRAVENOUS | Status: AC
  Filled 2020-12-20: qty 40

## 2020-12-20 MED ORDER — HYDROCODONE-ACETAMINOPHEN 5-325 MG PO TABS: 1.0000 | ORAL_TABLET | ORAL | 0 refills | Status: DC | PRN

## 2020-12-20 MED ORDER — LIDOCAINE-EPINEPHRINE 2 %-1:100000 IJ SOLN
INTRAMUSCULAR | Status: AC
  Filled 2020-12-20: qty 1

## 2020-12-20 MED ORDER — LIDOCAINE 2% (20 MG/ML) 5 ML SYRINGE
INTRAMUSCULAR | Status: DC | PRN
  Administered 2020-12-20: 80 mg via INTRAVENOUS

## 2020-12-20 MED ORDER — DEXMEDETOMIDINE (PRECEDEX) IN NS 20 MCG/5ML (4 MCG/ML) IV SYRINGE
PREFILLED_SYRINGE | INTRAVENOUS | Status: AC
  Filled 2020-12-20: qty 5

## 2020-12-20 MED ORDER — ROCURONIUM BROMIDE 10 MG/ML (PF) SYRINGE
PREFILLED_SYRINGE | INTRAVENOUS | Status: DC | PRN
  Administered 2020-12-20: 40 mg via INTRAVENOUS

## 2020-12-20 MED ORDER — CHLORHEXIDINE GLUCONATE 0.12 % MT SOLN: 15.0000 mL | Freq: Once | OROMUCOSAL | Status: AC

## 2020-12-20 MED ORDER — PROPOFOL 10 MG/ML IV BOLUS
INTRAVENOUS | Status: DC | PRN
  Administered 2020-12-20: 200 mg via INTRAVENOUS

## 2020-12-20 MED ORDER — AMISULPRIDE (ANTIEMETIC) 5 MG/2ML IV SOLN: 10.0000 mg | Freq: Once | INTRAVENOUS | Status: DC | PRN

## 2020-12-20 MED ORDER — CHLORHEXIDINE GLUCONATE 0.12 % MT SOLN
OROMUCOSAL | Status: AC
  Administered 2020-12-20: 15 mL via OROMUCOSAL
  Filled 2020-12-20: qty 15

## 2020-12-20 MED ORDER — FENTANYL CITRATE (PF) 100 MCG/2ML IJ SOLN: 25.0000 ug | INTRAMUSCULAR | Status: DC | PRN

## 2020-12-20 MED ORDER — LIDOCAINE 2% (20 MG/ML) 5 ML SYRINGE
INTRAMUSCULAR | Status: AC
  Filled 2020-12-20: qty 5

## 2020-12-20 MED ORDER — DEXMEDETOMIDINE (PRECEDEX) IN NS 20 MCG/5ML (4 MCG/ML) IV SYRINGE
PREFILLED_SYRINGE | INTRAVENOUS | Status: DC | PRN
  Administered 2020-12-20: 20 ug via INTRAVENOUS

## 2020-12-20 MED ORDER — GLYCOPYRROLATE PF 0.2 MG/ML IJ SOSY
PREFILLED_SYRINGE | INTRAMUSCULAR | Status: AC
  Filled 2020-12-20: qty 1

## 2020-12-20 MED ORDER — LACTATED RINGERS IV SOLN: INTRAVENOUS | Status: DC

## 2020-12-20 MED ORDER — ONDANSETRON HCL 4 MG/2ML IJ SOLN
INTRAMUSCULAR | Status: DC | PRN
  Administered 2020-12-20: 4 mg via INTRAVENOUS

## 2020-12-20 MED ORDER — FENTANYL CITRATE (PF) 250 MCG/5ML IJ SOLN
INTRAMUSCULAR | Status: DC | PRN
  Administered 2020-12-20: 50 ug via INTRAVENOUS

## 2020-12-20 MED ORDER — 0.9 % SODIUM CHLORIDE (POUR BTL) OPTIME
TOPICAL | Status: DC | PRN
  Administered 2020-12-20: 1000 mL

## 2020-12-20 MED ORDER — SUCCINYLCHOLINE CHLORIDE 200 MG/10ML IV SOSY
PREFILLED_SYRINGE | INTRAVENOUS | Status: AC
  Filled 2020-12-20: qty 10

## 2020-12-20 MED ORDER — SUCCINYLCHOLINE CHLORIDE 200 MG/10ML IV SOSY
PREFILLED_SYRINGE | INTRAVENOUS | Status: DC | PRN
  Administered 2020-12-20: 120 mg via INTRAVENOUS

## 2020-12-20 MED ORDER — ROCURONIUM BROMIDE 10 MG/ML (PF) SYRINGE
PREFILLED_SYRINGE | INTRAVENOUS | Status: AC
  Filled 2020-12-20: qty 10

## 2020-12-20 SURGICAL SUPPLY — 37 items
BLADE SURG 15 STRL LF DISP TIS (BLADE) ×1 IMPLANT
BLADE SURG 15 STRL SS (BLADE) ×4
BUR CROSS CUT FISSURE 1.6 (BURR) ×2 IMPLANT
BUR EGG ELITE 4.0 (BURR) ×2 IMPLANT
CANISTER SUCT 3000ML PPV (MISCELLANEOUS) ×2 IMPLANT
COVER SURGICAL LIGHT HANDLE (MISCELLANEOUS) ×2 IMPLANT
COVER WAND RF STERILE (DRAPES) IMPLANT
DECANTER SPIKE VIAL GLASS SM (MISCELLANEOUS) ×2 IMPLANT
DRAPE U-SHAPE 76X120 STRL (DRAPES) ×2 IMPLANT
GAUZE PACKING FOLDED 1IN STRL (GAUZE/BANDAGES/DRESSINGS) ×1 IMPLANT
GAUZE PACKING FOLDED 2  STR (GAUZE/BANDAGES/DRESSINGS) ×2
GAUZE PACKING FOLDED 2 STR (GAUZE/BANDAGES/DRESSINGS) ×1 IMPLANT
GLOVE BIO SURGEON STRL SZ 6.5 (GLOVE) IMPLANT
GLOVE BIO SURGEON STRL SZ7 (GLOVE) IMPLANT
GLOVE BIO SURGEON STRL SZ8 (GLOVE) ×2 IMPLANT
GLOVE SURG UNDER POLY LF SZ6.5 (GLOVE) IMPLANT
GLOVE SURG UNDER POLY LF SZ7 (GLOVE) IMPLANT
GOWN STRL REUS W/ TWL LRG LVL3 (GOWN DISPOSABLE) ×1 IMPLANT
GOWN STRL REUS W/ TWL XL LVL3 (GOWN DISPOSABLE) ×1 IMPLANT
GOWN STRL REUS W/TWL LRG LVL3 (GOWN DISPOSABLE) ×2
GOWN STRL REUS W/TWL XL LVL3 (GOWN DISPOSABLE) ×2
IV NS 1000ML (IV SOLUTION) ×2
IV NS 1000ML BAXH (IV SOLUTION) ×1 IMPLANT
KIT BASIN OR (CUSTOM PROCEDURE TRAY) ×2 IMPLANT
KIT TURNOVER KIT B (KITS) ×2 IMPLANT
NDL HYPO 25GX1X1/2 BEV (NEEDLE) ×2 IMPLANT
NEEDLE HYPO 25GX1X1/2 BEV (NEEDLE) ×4 IMPLANT
NS IRRIG 1000ML POUR BTL (IV SOLUTION) ×2 IMPLANT
PAD ARMBOARD 7.5X6 YLW CONV (MISCELLANEOUS) ×2 IMPLANT
SLEEVE IRRIGATION ELITE 7 (MISCELLANEOUS) ×2 IMPLANT
SPONGE SURGIFOAM ABS GEL 12-7 (HEMOSTASIS) IMPLANT
SUT CHROMIC 3 0 PS 2 (SUTURE) ×4 IMPLANT
SYR BULB IRRIG 60ML STRL (SYRINGE) ×2 IMPLANT
SYR CONTROL 10ML LL (SYRINGE) ×2 IMPLANT
TRAY ENT MC OR (CUSTOM PROCEDURE TRAY) ×2 IMPLANT
TUBING IRRIGATION (MISCELLANEOUS) ×2 IMPLANT
YANKAUER SUCT BULB TIP NO VENT (SUCTIONS) ×2 IMPLANT

## 2020-12-20 NOTE — Transfer of Care (Signed)
Immediate Anesthesia Transfer of Care Note  Patient: Scott Mendez  Procedure(s) Performed: DENTAL RESTORATION/EXTRACTIONS TEETH THIRTEEN AND NINETEEN. (N/A Mouth)  Patient Location: PACU  Anesthesia Type:General  Level of Consciousness: awake and patient cooperative  Airway & Oxygen Therapy: Patient Spontanous Breathing  Post-op Assessment: Report given to RN and Post -op Vital signs reviewed and stable  Post vital signs: Reviewed and stable  Last Vitals:  Vitals Value Taken Time  BP    Temp    Pulse    Resp    SpO2      Last Pain:  Vitals:   12/20/20 0931  TempSrc: Oral         Complications: No complications documented.

## 2020-12-20 NOTE — Anesthesia Procedure Notes (Addendum)
Procedure Name: Intubation Date/Time: 12/20/2020 12:35 PM Performed by: Michele Rockers, CRNA Pre-anesthesia Checklist: Patient identified, Patient being monitored, Timeout performed, Emergency Drugs available and Suction available Patient Re-evaluated:Patient Re-evaluated prior to induction Oxygen Delivery Method: Circle system utilized Preoxygenation: Pre-oxygenation with 100% oxygen Induction Type: IV induction and Rapid sequence Ventilation: Oral airway inserted - appropriate to patient size and Two handed mask ventilation required Laryngoscope Size: Mac, 4 and Glidescope Grade View: Grade I Tube type: Oral Tube size: 8.0 mm Number of attempts: 1 Airway Equipment and Method: Stylet Placement Confirmation: ETT inserted through vocal cords under direct vision,  positive ETCO2 and breath sounds checked- equal and bilateral Secured at: 23 cm Tube secured with: Tape Dental Injury: Teeth and Oropharynx as per pre-operative assessment  Difficulty Due To: Difficulty was anticipated, Difficult Airway- due to large tongue and Difficult Airway- due to limited oral opening

## 2020-12-20 NOTE — Progress Notes (Signed)
Patient non-verbal and unable to answer suicide questions

## 2020-12-20 NOTE — H&P (Signed)
H&P documentation  -History and Physical Reviewed  -Patient has been re-examined  -No change in the plan of care  Scott Mendez  

## 2020-12-20 NOTE — Op Note (Signed)
12/20/2020  12:58 PM  PATIENT:  Scott Mendez  40 y.o. male  PRE-OPERATIVE DIAGNOSIS:  NON RESTORABLE TEETH # 13, 19  POST-OPERATIVE DIAGNOSIS:  SAME  PROCEDURE:  Procedure(s): DENTAL EXTRACTIONS TEETH THIRTEEN AND NINETEEN.  SURGEON:  Surgeon(s): Ocie Doyne, DMD  ANESTHESIA:   local and general  EBL:  minimal  DRAINS: none   SPECIMEN:  No Specimen  COUNTS:  YES  PLAN OF CARE: Discharge to home after PACU  PATIENT DISPOSITION:  PACU - hemodynamically stable.   PROCEDURE DETAILS: Dictation # [id number]  Georgia Lopes, DMD 12/20/2020 12:58 PM

## 2020-12-21 ENCOUNTER — Encounter (HOSPITAL_COMMUNITY): Payer: Self-pay | Admitting: Oral Surgery

## 2020-12-21 NOTE — Op Note (Signed)
Scott Mendez, Scott Mendez MEDICAL RECORD NO: [id number] ACCOUNT NO: [id number] DATE OF BIRTH: 04-Jan-1981 FACILITY[facility] LOCATION: [facility] PHYSICIAN: Georgia Lopes, DDS  Operative Report   DATE OF PROCEDURE: 12/20/2020  PREOPERATIVE DIAGNOSES:  Morbid obesity, autism, nonrestorable teeth numbers 13 and 19.  PROCEDURE:  Extraction teeth numbers 13 and 19.  SURGEON:  Ocie Doyne, DDS  ANESTHESIA:  General oral intubation.  Dr. Marcene Duos, attending.  DESCRIPTION OF PROCEDURE:  The patient was taken to the operating room and placed on the table in supine position.  General anesthesia was administered and an oral endotracheal tube was placed and secured on the right side of the mouth. The eyes were  protected and the patient was draped for surgery.  Timeout was performed.  The posterior pharynx was suctioned and a throat pack was placed.  2% lidocaine 1:100,000 epinephrine was infiltrated in an inferior alveolar block on the left side and buccal and  palatal infiltration around tooth #13.  A total of 7 mL was utilized.  A bite block was placed in the mouth.  A sweetheart retractor was used to retract the tongue.  A 15 blade was used to make an incision in the gingival and lingual sulcus of #19 and  in the sulcus of tooth #13.  The periosteum was reflected with the periosteal elevator.  The teeth were elevated with a 301 elevator.  Tooth #19 was removed with the dental forceps.  Tooth #13 fractured upon attempted removal, necessitating reflection of  additional periosteal tissue to expose the surrounding bone.  Then, bone was removed using the Stryker handpiece under irrigation.  The tooth was sectioned and removed individually and roots, buccal and palatal root, with the 301 and rongeur.  Then, the  sockets of 13 and 19 were curetted, irrigated and closed with 3-0 chromic.  The oral cavity was then irrigated and suctioned.  The throat pack was removed.  The patient was left under the  care of anesthesia for extubation and transport to recovery room  with plans for discharge home through day surgery.  ESTIMATED BLOOD LOSS:  Minimum.  COMPLICATIONS:  None.  SPECIMENS:  None.   SHW D: 12/20/2020 1:00:28 pm T: 12/21/2020 1:29:00 am  JOB: [id number]/ [id number]

## 2020-12-21 NOTE — Anesthesia Postprocedure Evaluation (Signed)
Anesthesia Post Note  Patient: Roma L Cohea  Procedure(s) Performed: DENTAL RESTORATION/EXTRACTIONS TEETH THIRTEEN AND NINETEEN. (N/A Mouth)     Patient location during evaluation: PACU Anesthesia Type: General Level of consciousness: awake and alert Pain management: pain level controlled Vital Signs Assessment: post-procedure vital signs reviewed and stable Respiratory status: spontaneous breathing, nonlabored ventilation, respiratory function stable and patient connected to nasal cannula oxygen Cardiovascular status: blood pressure returned to baseline and stable Postop Assessment: no apparent nausea or vomiting Anesthetic complications: no   No complications documented.  Last Vitals:  Vitals:   12/20/20 1330 12/20/20 1345  BP: (!) 130/93 134/89  Pulse: (!) 118 (!) 108  Resp: (!) 23 20  Temp:  36.8 C  SpO2: 96% 95%    Last Pain:  Vitals:   12/20/20 1345  TempSrc:   PainSc: 0-No pain                 Kennieth Rad

## 2021-01-18 ENCOUNTER — Other Ambulatory Visit: Payer: Self-pay | Admitting: Nurse Practitioner

## 2021-01-18 ENCOUNTER — Other Ambulatory Visit: Payer: Self-pay | Admitting: Family Medicine

## 2021-01-18 DIAGNOSIS — I10 Essential (primary) hypertension: Secondary | ICD-10-CM

## 2021-01-26 ENCOUNTER — Other Ambulatory Visit: Payer: Self-pay | Admitting: Family Medicine

## 2021-02-27 ENCOUNTER — Other Ambulatory Visit: Payer: Self-pay | Admitting: Family Medicine

## 2021-02-27 ENCOUNTER — Other Ambulatory Visit: Payer: Self-pay | Admitting: Nurse Practitioner

## 2021-02-27 DIAGNOSIS — I10 Essential (primary) hypertension: Secondary | ICD-10-CM

## 2021-03-03 ENCOUNTER — Telehealth: Payer: Self-pay

## 2021-03-03 NOTE — Telephone Encounter (Signed)
Patient mother Scott Mendez) called said this medicine olmesartan-hydrochlorothiazide (BENICAR HCT) 40-12.5 MG tablet was never received at [facility].

## 2021-03-06 ENCOUNTER — Other Ambulatory Visit: Payer: Self-pay

## 2021-03-06 DIAGNOSIS — I10 Essential (primary) hypertension: Secondary | ICD-10-CM

## 2021-03-06 MED ORDER — OLMESARTAN MEDOXOMIL-HCTZ 40-12.5 MG PO TABS: 1.0000 | ORAL_TABLET | Freq: Every day | ORAL | 0 refills | Status: DC

## 2021-03-06 NOTE — Telephone Encounter (Signed)
Rx resent.

## 2021-03-20 ENCOUNTER — Other Ambulatory Visit: Payer: Self-pay | Admitting: Family Medicine

## 2021-03-24 ENCOUNTER — Other Ambulatory Visit: Payer: Self-pay

## 2021-03-24 ENCOUNTER — Ambulatory Visit (INDEPENDENT_AMBULATORY_CARE_PROVIDER_SITE_OTHER): Payer: Medicare Other | Admitting: Nurse Practitioner

## 2021-03-24 ENCOUNTER — Encounter: Payer: Self-pay | Admitting: Nurse Practitioner

## 2021-03-24 ENCOUNTER — Inpatient Hospital Stay: Payer: Self-pay

## 2021-03-24 VITALS — BP 132/85 | HR 92 | Ht 71.0 in | Wt 368.0 lb

## 2021-03-24 DIAGNOSIS — M7989 Other specified soft tissue disorders: Secondary | ICD-10-CM

## 2021-03-24 NOTE — Assessment & Plan Note (Signed)
-  has had right leg swelling in the past -he takes lasix -given lack of erythema, doubtful celluitis -will check U/S to r/o clot, but this seems less likely given his hx -referral to PT for lymphedema

## 2021-03-24 NOTE — Progress Notes (Signed)
Acute Office Visit  Subjective:    Patient ID: Scott Mendez, male    DOB: 1981/03/14, 40 y.o.   MRN: [id number]  Chief Complaint  Patient presents with   Edema    R leg swelling ongoing x1 week    HPI Patient is in today for swollen right leg x 1 week.  Past Medical History:  Diagnosis Date   Autism    Hypertension    states under control with meds., has been on med. x 8 yr.   Morbid obesity with BMI of 45.0-49.9, adult ([place])    Nonrestorable tooth 02/2018   x 2   Nonverbal    Sleep apnea    mild - no CPAP    Past Surgical History:  Procedure Laterality Date   CYST EXCISION Right 03/03/2002   mandible   MULTIPLE TOOTH EXTRACTIONS  03/03/2002   #1, 16, 17, 30, 32   TOENAIL EXCISION Bilateral    great toe   TOOTH EXTRACTION Right 09/03/2014   Procedure: EXTRACTION MOLAR LOWER RIGHT;  Surgeon: Gae Bon, DDS;  Location: [place];  Service: Oral Surgery;  Laterality: Right;   TOOTH EXTRACTION N/A 03/06/2018   Procedure: EXTRACTION X 1;  Surgeon: Diona Browner, DDS;  Location: [place];  Service: Oral Surgery;  Laterality: N/A;   TOOTH EXTRACTION N/A 12/20/2020   Procedure: DENTAL RESTORATION/EXTRACTIONS TEETH THIRTEEN AND NINETEEN.;  Surgeon: Diona Browner, DMD;  Location: [place];  Service: Oral Surgery;  Laterality: N/A;    Family History  Problem Relation Age of Onset   Hypertension Mother    Hypertension Father    Diabetes Father    Heart disease Maternal Grandfather    Diabetes Paternal Grandmother    Seizures Maternal Uncle    CVA Maternal Grandmother    Heart disease Paternal Grandfather     Social History   Socioeconomic History   Marital status: Single    Spouse name: N/A   Number of children: 0   Years of education: Not on file   Highest education level: Not on file  Occupational History   Not on file  Tobacco Use   Smoking status: Never   Smokeless tobacco: Never  Vaping Use   Vaping Use: Never used  Substance and  Sexual Activity   Alcohol use: No   Drug use: No   Sexual activity: Never  Other Topics Concern   Not on file  Social History Narrative   Lives with Mother and sister    Social Determinants of Health   Financial Resource Strain: Low Risk    Difficulty of Paying Living Expenses: Not hard at all  Food Insecurity: No Food Insecurity   Worried About Charity fundraiser in the Last Year: Never true   [place] in the Last Year: Never true  Transportation Needs: No Transportation Needs   Lack of Transportation (Medical): No   Lack of Transportation (Non-Medical): No  Physical Activity: Insufficiently Active   Days of Exercise per Week: 4 days   Minutes of Exercise per Session: 30 min  Stress: No Stress Concern Present   Feeling of Stress : Not at all  Social Connections: Socially Isolated   Frequency of Communication with Friends and Family: Never   Frequency of Social Gatherings with Friends and Family: Twice a week   Attends Religious Services: More than 4 times per year   Active Member of [employer] or Organizations: No   Attends Archivist Meetings:  Never   Marital Status: Never married  Human resources officer Violence: Not At Risk   Fear of Current or Ex-Partner: No   Emotionally Abused: No   Physically Abused: No   Sexually Abused: No    Outpatient Medications Prior to Visit  Medication Sig Dispense Refill   acetaminophen (TYLENOL) 500 MG tablet Take 1,000 mg by mouth every 6 (six) hours as needed for moderate pain or headache.     furosemide (LASIX) 20 MG tablet Take 20 mg by mouth 2 (two) times daily.     KLOR-CON M20 20 MEQ tablet TAKE 1 TABLET BY MOUTH TWICE A DAY 180 tablet 1   loratadine (CLARITIN) 10 MG tablet TAKE ONE TABLET BY MOUTH ONCE DAILY. 90 tablet 0   montelukast (SINGULAIR) 10 MG tablet Take 1 tablet (10 mg total) by mouth at bedtime. 90 tablet 1   olmesartan-hydrochlorothiazide (BENICAR HCT) 40-12.5 MG tablet Take 1 tablet by mouth daily. 30 tablet  0   selenium sulfide (SELSUN) 1 % LOTN Wash  Hair daily with 12 cc shampoo for 3 weeks , then twice weekly (Patient taking differently: Apply 1 application topically 2 (two) times a week. Wash hair with 12 cc) 420 mL 3   traZODone (DESYREL) 100 MG tablet TAKE 1 TABLET BY MOUTH AT BEDTIME. 90 tablet 0   Vitamin D, Ergocalciferol, (DRISDOL) 1.25 MG (50000 UNIT) CAPS capsule Take 1 capsule (50,000 Units total) by mouth once a week. (Patient taking differently: Take 50,000 Units by mouth every Thursday.) 12 capsule 1   furosemide (LASIX) 20 MG tablet TAKE 1 TABLET BY MOUTH 2 TIMES DAILY. (Patient not taking: Reported on 03/24/2021) 60 tablet 0   HYDROcodone-acetaminophen (NORCO) 5-325 MG tablet Take 1-2 tablets by mouth every 4 (four) hours as needed for moderate pain. (Patient not taking: Reported on 03/24/2021) 16 tablet 0   No facility-administered medications prior to visit.    No Known Allergies  Review of Systems  Constitutional: Negative.   Cardiovascular:  Positive for leg swelling.       Right leg swelling      Objective:    Physical Exam Constitutional:      Appearance: He is obese.  Cardiovascular:     Rate and Rhythm: Normal rate and regular rhythm.     Pulses: Normal pulses.     Heart sounds: Normal heart sounds.     Comments: Right leg significantly more swollen than left; no sign of infection Pulmonary:     Effort: Pulmonary effort is normal.     Breath sounds: Normal breath sounds.  Neurological:     Mental Status: He is alert.    BP 132/85 (BP Location: Left Arm, Patient Position: Sitting, Cuff Size: Large)   Pulse 92   Ht $R'5\' 11"'fP$  (1.803 m)   Wt (!) 368 lb (166.9 kg)   SpO2 99%   BMI 51.33 kg/m  Wt Readings from Last 3 Encounters:  03/24/21 (!) 368 lb (166.9 kg)  12/20/20 (!) 351 lb (159.2 kg)  12/06/20 (!) 370 lb (167.8 kg)    Health Maintenance Due  Topic Date Due   Pneumococcal Vaccine 39-22 Years old (1 - PCV) Never done   COVID-19 Vaccine (4 - Booster  for Moderna series) 02/07/2021   INFLUENZA VACCINE  03/06/2021    There are no preventive care reminders to display for this patient.   Lab Results  Component Value Date   TSH 2.630 11/07/2020   Lab Results  Component Value Date   WBC  8.4 11/07/2020   HGB 14.6 11/07/2020   HCT 43.8 11/07/2020   MCV 88 11/07/2020   PLT 291 11/07/2020   Lab Results  Component Value Date   NA 143 11/07/2020   K 4.2 11/07/2020   CO2 24 11/07/2020   GLUCOSE 101 (H) 11/07/2020   BUN 23 (H) 11/07/2020   CREATININE 1.10 11/07/2020   BILITOT 0.4 11/07/2020   ALKPHOS 114 11/07/2020   AST 36 11/07/2020   ALT 46 (H) 11/07/2020   PROT 6.8 11/07/2020   ALBUMIN 4.3 11/07/2020   CALCIUM 9.1 11/07/2020   ANIONGAP 8 07/07/2018   EGFR 89 11/07/2020   Lab Results  Component Value Date   CHOL 142 11/07/2020   Lab Results  Component Value Date   HDL 37 (L) 11/07/2020   Lab Results  Component Value Date   LDLCALC 82 11/07/2020   Lab Results  Component Value Date   TRIG 127 11/07/2020   Lab Results  Component Value Date   CHOLHDL 3.8 11/07/2020   Lab Results  Component Value Date   HGBA1C 5.3 11/04/2019       Assessment & Plan:   Problem List Items Addressed This Visit       Other   Right leg swelling - Primary    -has had right leg swelling in the past -he takes lasix -given lack of erythema, doubtful celluitis -will check U/S to r/o clot, but this seems less likely given his hx -referral to PT for lymphedema      Relevant Orders   Ambulatory referral to Physical Therapy   [facility] LOWER EXTREMITY VENOUS DVT RIGHT (BACK OFFICE)     No orders of the defined types were placed in this encounter.    Noreene Larsson, NP

## 2021-03-31 ENCOUNTER — Other Ambulatory Visit: Payer: Self-pay | Admitting: Family Medicine

## 2021-04-18 ENCOUNTER — Other Ambulatory Visit: Payer: Self-pay | Admitting: Family Medicine

## 2021-04-18 DIAGNOSIS — E559 Vitamin D deficiency, unspecified: Secondary | ICD-10-CM

## 2021-04-24 ENCOUNTER — Ambulatory Visit (HOSPITAL_COMMUNITY): Payer: Medicare Other | Attending: Nurse Practitioner | Admitting: Physical Therapy

## 2021-04-24 ENCOUNTER — Other Ambulatory Visit: Payer: Self-pay

## 2021-04-24 DIAGNOSIS — I890 Lymphedema, not elsewhere classified: Secondary | ICD-10-CM | POA: Insufficient documentation

## 2021-04-24 NOTE — Therapy (Addendum)
[facility] [address] [place], [place], [address] Phone: [phone]   Fax:  [phone]  Physical Therapy Evaluation  Patient Details  Name: Scott Mendez MRN: [id number] Date of Birth: Mar 21, 1981 Referring Provider (PT): Bjorn Pippin   Encounter Date: 04/24/2021   PT End of Session - 04/24/21 0943     Visit Number 1    Number of Visits 12    Date for PT Re-Evaluation 05/24/21    Authorization Type medicare    Progress Note Due on Visit 10    PT Start Time 0930    PT Stop Time 1045    PT Time Calculation (min) 75 min             Past Medical History:  Diagnosis Date   Autism    Hypertension    states under control with meds., has been on med. x 8 yr.   Morbid obesity with BMI of 45.0-49.9, adult (HCC)    Nonrestorable tooth 02/2018   x 2   Nonverbal    Sleep apnea    mild - no CPAP    Past Surgical History:  Procedure Laterality Date   CYST EXCISION Right 03/03/2002   mandible   MULTIPLE TOOTH EXTRACTIONS  03/03/2002   #1, 16, 17, 30, 32   TOENAIL EXCISION Bilateral    great toe   TOOTH EXTRACTION Right 09/03/2014   Procedure: EXTRACTION MOLAR LOWER RIGHT;  Surgeon: Georgia Lopes, DDS;  [facility];  Service: Oral Surgery;  Laterality: Right;   TOOTH EXTRACTION N/A 03/06/2018   Procedure: EXTRACTION X 1;  Surgeon: Ocie Doyne, DDS;  Location: [facility];  Service: Oral Surgery;  Laterality: N/A;   TOOTH EXTRACTION N/A 12/20/2020   Procedure: DENTAL RESTORATION/EXTRACTIONS TEETH THIRTEEN AND NINETEEN.;  Surgeon: Ocie Doyne, DMD;  Location: [facility];  Service: Oral Surgery;  Laterality: N/A;    There were no vitals filed for this visit.        [facility] PT Assessment - 04/24/21 0001       Assessment   Medical Diagnosis rt LE lymphedema    Referring Provider (PT) Bjorn Pippin    Next MD Visit as needed    Prior Therapy December 2021 for lymphedema      Precautions   Precautions  --   cellulitis     Restrictions   Weight Bearing Restrictions No      Balance Screen   Has the patient fallen in the past 6 months No    Has the patient had a decrease in activity level because of a fear of falling?  Yes    Is the patient reluctant to leave their home because of a fear of falling?  No      Prior Function   Level of Independence Independent      Cognition   Overall Cognitive Status History of cognitive impairments - at baseline               LYMPHEDEMA/ONCOLOGY QUESTIONNAIRE - 04/24/21 0001       What other symptoms do you have   Are you Having Heaviness or Tightness Yes    Are you having Pain No    Are you having pitting edema No    Body Site legs    Is it Hard or Difficult finding clothes that fit No    Do you have infections No      Lymphedema Stage   Stage STAGE 2  SPONTANEOUSLY IRREVERSIBLE      Lymphedema Assessments   Lymphedema Assessments Lower extremities      Right Lower Extremity Lymphedema   20 cm Proximal to Suprapatella 81.5 cm   was 82   10 cm Proximal to Suprapatella 72.4 cm   was 72   At Midpatella/Popliteal Crease 54 cm   was 52   30 cm Proximal to Floor at Lateral Plantar Foot 48 cm   was 48   20 cm Proximal to Floor at Lateral Plantar Foot 45.5 1   was 42   10 cm Proximal to Floor at Lateral Malleoli 37 cm   was 37.8   Circumference of ankle/heel 45 cm.   was 44   5 cm Proximal to 1st MTP Joint 31.5 cm   was 31.3   Across MTP Joint 29 cm   was 29.5   Around Proximal Great Toe 11.5 cm   was10.7     Left Lower Extremity Lymphedema   20 cm Proximal to Suprapatella 78.5 cm   was 78.2   10 cm Proximal to Suprapatella 67.9 cm   64   At Midpatella/Popliteal Crease 51.8 cm   52.8   30 cm Proximal to Floor at Lateral Plantar Foot 42.7 cm   46   20 cm Proximal to Floor at Lateral Plantar Foot 34.7 cm   34   10 cm Proximal to Floor at Lateral Malleoli 30 cm   30   Circumference of ankle/heel 42 cm.   40.8   5 cm Proximal to 1st MTP  Joint 31 cm   31   Across MTP Joint 28.8 cm   28   Around Proximal Great Toe 12.5 cm   11                    Objective measurements completed on examination: See above findings.       [facility] Adult PT Treatment/Exercise - 04/24/21 0001       Manual Therapy   Manual Therapy Manual Lymphatic Drainage (MLD);Compression Bandaging    Manual therapy comments completed seperate from all other aspects of treatment    Manual Lymphatic Drainage (MLD) to include supraclavicular, deep and superfical abdominal, Rt inguinal/axillary anastomosis completed anteriorly only due to time restraints.    Compression Bandaging using foam and multilayer compression bandages from toes to thigh level.                     PT Education - 04/24/21 1045     Education Details [employer].  Pt mother will bring a clean short stretch to practice donning on his Lt.  Bandages were dirty explained to mother to wash but lay our to dry Tuesday night, roll Wed morning prior to treatment time.    Person(s) Educated Parent(s)    Methods Explanation    Comprehension Verbalized understanding              PT Short Term Goals - 04/24/21 1247       PT SHORT TERM GOAL #1   Title PT Rt LE measurements at 30/20/and 10 cm to decrease 1 cm to reduce risk of cellulitis.    Time 2    Period Weeks    Status New    Target Date 05/08/21               PT Long Term Goals - 04/24/21 1249       PT LONG TERM GOAL #1  Title PT Rt LE at 30,20 and 10 cm measurements to be decreased  by 2-3 cm to reduce risk of cellulitis.    Time 6    Period Weeks    Status New    Target Date 05/22/21      PT LONG TERM GOAL #2   Title PT to have a new compresion garment and mother to be able to don    Time 4    Period Weeks    Status New                    Plan - 04/24/21 1048     Clinical Impression Statement Scott Mendez is a known lymphedema pt to this clinic being discharged on Jul 26 2020.  His measurements are up slightly but not extreme.  His mother was concern when she noted that he was having increased edema as she did not want them to get as bad as they once were.  When speaking to the mother she has not gotten new compression garments since December.  The therapist reminded the pt's mother that they need to be exchanged for new garments every six months.  She was given measurements and pamphlet to order new garments.  Pt will be seen and benefit from skilled PT to reduce his edema to maximal level and reduce the risk of cellulitis.    Personal Factors and Comorbidities Fitness    Examination-Activity Limitations Dressing;Locomotion Level    Examination-Participation Restrictions Community Activity    Stability/Clinical Decision Making Evolving/Moderate complexity    Clinical Decision Making Moderate    Rehab Potential Good    PT Frequency 3x / week    PT Duration 4 weeks   Most likely will be done in 2-3 weeks   PT Treatment/Interventions Manual techniques;Manual lymph drainage;Patient/family education    PT Next Visit Plan Total decongestive techniques For Right LE, manual for B    PT Home Exercise Plan has             Patient will benefit from skilled therapeutic intervention in order to improve the following deficits and impairments:  Decreased skin integrity, Increased edema  Visit Diagnosis: Lymphedema, not elsewhere classified - Plan: PT plan of care cert/re-cert     Problem List Patient Active Problem List   Diagnosis Date Noted   Periapical abscess 10/04/2020   Dandruff in adult 10/11/2018   Right leg swelling 07/15/2018   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

## 2021-04-26 ENCOUNTER — Ambulatory Visit (HOSPITAL_COMMUNITY): Payer: Medicare Other | Admitting: Physical Therapy

## 2021-04-26 ENCOUNTER — Other Ambulatory Visit: Payer: Self-pay

## 2021-04-26 DIAGNOSIS — I890 Lymphedema, not elsewhere classified: Secondary | ICD-10-CM | POA: Diagnosis not present

## 2021-04-26 NOTE — Therapy (Signed)
[facility] [address] [place], [place], [address] Phone: [phone]   Fax:  [phone]  Physical Therapy Treatment  Patient Details  Name: Scott Mendez MRN: [id number] Date of Birth: 1981-03-17 Referring Provider (PT): Bjorn Pippin   Encounter Date: 04/26/2021   PT End of Session - 04/26/21 1720     Visit Number 2    Number of Visits 12    Date for PT Re-Evaluation 05/24/21    Authorization Type medicare    Progress Note Due on Visit 10    PT Start Time 1408    PT Stop Time 1518    PT Time Calculation (min) 70 min             Past Medical History:  Diagnosis Date   Autism    Hypertension    states under control with meds., has been on med. x 8 yr.   Morbid obesity with BMI of 45.0-49.9, adult (HCC)    Nonrestorable tooth 02/2018   x 2   Nonverbal    Sleep apnea    mild - no CPAP    Past Surgical History:  Procedure Laterality Date   CYST EXCISION Right 03/03/2002   mandible   MULTIPLE TOOTH EXTRACTIONS  03/03/2002   #1, 16, 17, 30, 32   TOENAIL EXCISION Bilateral    great toe   TOOTH EXTRACTION Right 09/03/2014   Procedure: EXTRACTION MOLAR LOWER RIGHT;  Surgeon: Georgia Lopes, DDS;  [facility]: [facility];  Service: Oral Surgery;  Laterality: Right;   TOOTH EXTRACTION N/A 03/06/2018   Procedure: EXTRACTION X 1;  Surgeon: Ocie Doyne, DDS;  Location: [facility];  Service: Oral Surgery;  Laterality: N/A;   TOOTH EXTRACTION N/A 12/20/2020   Procedure: DENTAL RESTORATION/EXTRACTIONS TEETH THIRTEEN AND NINETEEN.;  Surgeon: Ocie Doyne, DMD;  Location: [facility];  Service: Oral Surgery;  Laterality: N/A;    There were no vitals filed for this visit.   Subjective Assessment - 04/26/21 1707     Subjective Mother states he was able to keep his bandages on.  States she ordered his new stockings and is interested in learning how to use the butler.                                [facility] Adult PT Treatment/Exercise - 04/26/21 0001       Manual Therapy   Manual Therapy Manual Lymphatic Drainage (MLD);Compression Bandaging    Manual therapy comments completed seperate from all other aspects of treatment    Manual Lymphatic Drainage (MLD) to include supraclavicular, deep and superfical abdominal, Rt inguinal/axillary anastomosis completed anteriorly only due to time restraints.    Compression Bandaging using foam and multilayer compression bandages from toes to thigh level.                     PT Education - 04/26/21 1720     Education Details how to use sock butler to don stockings and how to purchase one from [employer].    Person(s) Educated Parent(s)    Methods Explanation;Demonstration    Comprehension Verbalized understanding;Returned demonstration;Verbal cues required;Tactile cues required              PT Short Term Goals - 04/24/21 1247       PT SHORT TERM GOAL #1   Title PT Rt LE measurements at 30/20/and 10 cm to decrease  1cm to reduce risk of cellulitis.    Time 2    Period Weeks    Status New    Target Date 05/08/21               PT Long Term Goals - 04/24/21 1249       PT LONG TERM GOAL #1   Title PT Rt LE at 30,20 and 10 cm measurements to be decreased  by 2-3 cm to reduce risk of cellulitis.    Time 6    Period Weeks    Status New    Target Date 05/22/21      PT LONG TERM GOAL #2   Title PT to have a new compresion garment and mother to be able to don    Time 4    Period Weeks    Status New                   Plan - 04/26/21 1721     Clinical Impression Statement Demonstrated the sock butler using pt's garment and had him don on Lt LE.  Mother able to place on butler and pt able to pull up on handles with cues.  Manual lymph drainage completed followed by compression bandaging to Rt LE.  Toe bandaging was not included this session as mother reports his toes are always that size and are equal in  size to Lt foot.  Pt encouraged to order butler to assist her in donning new stockings.    Personal Factors and Comorbidities Fitness    Examination-Activity Limitations Dressing;Locomotion Level    Examination-Participation Restrictions Community Activity    Stability/Clinical Decision Making Evolving/Moderate complexity    Rehab Potential Good    PT Frequency 3x / week    PT Duration 4 weeks   Most likely will be done in 2-3 weeks   PT Treatment/Interventions Manual techniques;Manual lymph drainage;Patient/family education    PT Next Visit Plan Total decongestive techniques For Right LE, manual for Bil LE's.  Measure on Wednesdays.    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 [place], [place], [address] Phone: [phone]   Fax:  [phone]  Name: Scott Mendez MRN: [id number] Date of Birth: 26-Mar-1981

## 2021-04-28 ENCOUNTER — Other Ambulatory Visit: Payer: Self-pay

## 2021-04-28 ENCOUNTER — Ambulatory Visit (HOSPITAL_COMMUNITY): Payer: Medicare Other | Admitting: Physical Therapy

## 2021-04-28 DIAGNOSIS — I890 Lymphedema, not elsewhere classified: Secondary | ICD-10-CM

## 2021-04-28 NOTE — Therapy (Signed)
[facility] [address] [place], [place], [address] Phone: [phone]   Fax:  [phone]  Physical Therapy Treatment  Patient Details  Name: Scott Mendez MRN: [id number] Date of Birth: 1980/10/03 Referring Provider (PT): Bjorn Pippin   Encounter Date: 04/28/2021   PT End of Session - 04/28/21 1046     Visit Number 3    Number of Visits 12    Date for PT Re-Evaluation 05/24/21    Authorization Type medicare    Progress Note Due on Visit 10    PT Start Time 0935    PT Stop Time 1040    PT Time Calculation (min) 65 min             Past Medical History:  Diagnosis Date   Autism    Hypertension    states under control with meds., has been on med. x 8 yr.   Morbid obesity with BMI of 45.0-49.9, adult (HCC)    Nonrestorable tooth 02/2018   x 2   Nonverbal    Sleep apnea    mild - no CPAP    Past Surgical History:  Procedure Laterality Date   CYST EXCISION Right 03/03/2002   mandible   MULTIPLE TOOTH EXTRACTIONS  03/03/2002   #1, 16, 17, 30, 32   TOENAIL EXCISION Bilateral    great toe   TOOTH EXTRACTION Right 09/03/2014   Procedure: EXTRACTION MOLAR LOWER RIGHT;  Surgeon: Georgia Lopes, DDS;  Location: [facility];  Service: Oral Surgery;  Laterality: Right;   TOOTH EXTRACTION N/A 03/06/2018   Procedure: EXTRACTION X 1;  Surgeon: Ocie Doyne, DDS;  [facility];  Service: Oral Surgery;  Laterality: N/A;   TOOTH EXTRACTION N/A 12/20/2020   Procedure: DENTAL RESTORATION/EXTRACTIONS TEETH THIRTEEN AND NINETEEN.;  Surgeon: Ocie Doyne, DMD;  Location: [facility];  Service: Oral Surgery;  Laterality: N/A;    There were no vitals filed for this visit.   Subjective Assessment - 04/28/21 1045     Subjective late for appt today.  No pain or issues    Currently in Pain? No/denies                               [facility] Adult PT Treatment/Exercise - 04/28/21 0001       Manual  Therapy   Manual Therapy Manual Lymphatic Drainage (MLD);Compression Bandaging    Manual therapy comments completed seperate from all other aspects of treatment    Manual Lymphatic Drainage (MLD) to include supraclavicular, deep and superfical abdominal, Rt inguinal/axillary anastomosis completed anteriorly only due to time restraints.    Compression Bandaging using foam and multilayer compression bandages from toes to thigh level.                       PT Short Term Goals - 04/24/21 1247       PT SHORT TERM GOAL #1   Title PT Rt LE measurements at 30/20/and 10 cm to decrease  1cm to reduce risk of cellulitis.    Time 2    Period Weeks    Status New    Target Date 05/08/21               PT Long Term Goals - 04/24/21 1249       PT LONG TERM GOAL #1   Title PT Rt LE at 30,20 and 10 cm measurements  to be decreased  by 2-3 cm to reduce risk of cellulitis.    Time 6    Period Weeks    Status New    Target Date 05/22/21      PT LONG TERM GOAL #2   Title PT to have a new compresion garment and mother to be able to don    Time 4    Period Weeks    Status New                   Plan - 04/28/21 1046     Clinical Impression Statement Pt arrived late for appt today.  Visual reduction noted in Rt LE as compared to last session.  Manual lymph drainage continued for bilateral LE's both anteriorly and posteriorly (in side lying). Compression bandaging to Rt LE fully to thigh.  Pt able to keep these on for required time with mom reporting no issues.  Anticipate pt receiving his new garments next week.   Will need to be measured on Monday or Wednesday of next week.    Personal Factors and Comorbidities Fitness    Examination-Activity Limitations Dressing;Locomotion Level    Examination-Participation Restrictions Community Activity    Stability/Clinical Decision Making Evolving/Moderate complexity    Rehab Potential Good    PT Frequency 3x / week    PT Duration 4  weeks   Most likely will be done in 2-3 weeks   PT Treatment/Interventions Manual techniques;Manual lymph drainage;Patient/family education    PT Next Visit Plan Total decongestive techniques For Right LE, manual for Bil LE's.  Measure on Mondays or Wednesdays.             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

## 2021-05-01 ENCOUNTER — Ambulatory Visit (HOSPITAL_COMMUNITY): Payer: Medicare Other | Admitting: Physical Therapy

## 2021-05-01 ENCOUNTER — Other Ambulatory Visit: Payer: Self-pay

## 2021-05-01 ENCOUNTER — Other Ambulatory Visit: Payer: Self-pay | Admitting: Nurse Practitioner

## 2021-05-01 ENCOUNTER — Other Ambulatory Visit: Payer: Self-pay | Admitting: Family Medicine

## 2021-05-01 DIAGNOSIS — I890 Lymphedema, not elsewhere classified: Secondary | ICD-10-CM

## 2021-05-01 DIAGNOSIS — I10 Essential (primary) hypertension: Secondary | ICD-10-CM

## 2021-05-01 NOTE — Therapy (Signed)
[facility] [address] [place], [place], [address] Phone: [phone]   Fax:  [phone]  Physical Therapy Treatment  Patient Details  Name: Scott Mendez MRN: [id number] Date of Birth: 1981/06/22 Referring Provider (PT): Bjorn Pippin   Encounter Date: 05/01/2021   PT End of Session - 05/01/21 1639     Visit Number 4    Number of Visits 12    Date for PT Re-Evaluation 05/24/21    Authorization Type medicare    Progress Note Due on Visit 10    PT Start Time 1540   pt late for appointment   PT Stop Time 1625    PT Time Calculation (min) 45 min             Past Medical History:  Diagnosis Date   Autism    Hypertension    states under control with meds., has been on med. x 8 yr.   Morbid obesity with BMI of 45.0-49.9, adult (HCC)    Nonrestorable tooth 02/2018   x 2   Nonverbal    Sleep apnea    mild - no CPAP    Past Surgical History:  Procedure Laterality Date   CYST EXCISION Right 03/03/2002   mandible   MULTIPLE TOOTH EXTRACTIONS  03/03/2002   #1, 16, 17, 30, 32   TOENAIL EXCISION Bilateral    great toe   TOOTH EXTRACTION Right 09/03/2014   Procedure: EXTRACTION MOLAR LOWER RIGHT;  Surgeon: Georgia Lopes, DDS;  Location: [facility];  Service: Oral Surgery;  Laterality: Right;   TOOTH EXTRACTION N/A 03/06/2018   Procedure: EXTRACTION X 1;  Surgeon: Ocie Doyne, DDS;  Location: [facility];  Service: Oral Surgery;  Laterality: N/A;   TOOTH EXTRACTION N/A 12/20/2020   Procedure: DENTAL RESTORATION/EXTRACTIONS TEETH THIRTEEN AND NINETEEN.;  Surgeon: Ocie Doyne, DMD;  Location: [facility];  Service: Oral Surgery;  Laterality: N/A;    There were no vitals filed for this visit.   Subjective Assessment - 05/01/21 1638     Subjective Pt is non verbal does not express any pain issues    Currently in Pain? No/denies                               [facility] Adult PT Treatment/Exercise -  05/01/21 0001       Manual Therapy   Manual Therapy Manual Lymphatic Drainage (MLD);Compression Bandaging    Manual therapy comments completed seperate from all other aspects of treatment    Manual Lymphatic Drainage (MLD) to include supraclavicular, deep and superfical abdominal, Rt inguinal/axillary anastomosis completed anteriorly only due to time restraints.    Compression Bandaging using foam and multilayer compression bandages from toes to thigh level.                       PT Short Term Goals - 05/01/21 1642       PT SHORT TERM GOAL #1   Title PT Rt LE measurements at 30/20/and 10 cm to decrease  1cm to reduce risk of cellulitis.    Time 2    Period Weeks    Status New    Target Date 05/08/21      PT SHORT TERM GOAL #2   Title PT mother to state that they are completing the LE exercises at home at least 3x/wk to increase lymphatic circulation    Time 3  Status Achieved               PT Long Term Goals - 05/01/21 1644       PT LONG TERM GOAL #1   Title PT Rt LE at 30,20 and 10 cm measurements to be decreased  by 2-3 cm to reduce risk of cellulitis.    Time 6    Period Weeks    Status On-going      PT LONG TERM GOAL #2   Title PT to have a new compresion garment and mother to be able to don    Time 4    Period Weeks    Status On-going                   Plan - 05/01/21 1640     Clinical Impression Statement Pt late for appointment .  PT posteriorly completed in sidelying postition.  PT unable to get his new compression garment as they do not have his size in stock.    Personal Factors and Comorbidities Fitness    Examination-Activity Limitations Dressing;Locomotion Level    Examination-Participation Restrictions Community Activity    Stability/Clinical Decision Making Evolving/Moderate complexity    Rehab Potential Good    PT Frequency 3x / week    PT Duration 4 weeks   Most likely will be done in 2-3 weeks   PT  Treatment/Interventions Manual techniques;Manual lymph drainage;Patient/family education    PT Next Visit Plan Total decongestive techniques For Right LE, manual for Bil LE's.  Measure  Wednesdays.             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

## 2021-05-03 ENCOUNTER — Other Ambulatory Visit: Payer: Self-pay

## 2021-05-03 ENCOUNTER — Ambulatory Visit (HOSPITAL_COMMUNITY): Payer: Medicare Other | Admitting: Physical Therapy

## 2021-05-03 DIAGNOSIS — I890 Lymphedema, not elsewhere classified: Secondary | ICD-10-CM | POA: Diagnosis not present

## 2021-05-03 NOTE — Therapy (Signed)
[facility] [address] [place], [place], [address] Phone: [phone]   Fax:  [phone]  Physical Therapy Treatment  Patient Details  Name: Scott Mendez MRN: [id number] Date of Birth: 11-21-1980 Referring Provider (PT): Bjorn Pippin   Encounter Date: 05/03/2021   PT End of Session - 05/03/21 1642     Visit Number 5    Number of Visits 12    Date for PT Re-Evaluation 05/24/21    Authorization Type medicare    Progress Note Due on Visit 10    PT Start Time 1531    PT Stop Time 1639    PT Time Calculation (min) 68 min             Past Medical History:  Diagnosis Date   Autism    Hypertension    states under control with meds., has been on med. x 8 yr.   Morbid obesity with BMI of 45.0-49.9, adult (HCC)    Nonrestorable tooth 02/2018   x 2   Nonverbal    Sleep apnea    mild - no CPAP    Past Surgical History:  Procedure Laterality Date   CYST EXCISION Right 03/03/2002   mandible   MULTIPLE TOOTH EXTRACTIONS  03/03/2002   #1, 16, 17, 30, 32   TOENAIL EXCISION Bilateral    great toe   TOOTH EXTRACTION Right 09/03/2014   Procedure: EXTRACTION MOLAR LOWER RIGHT;  Surgeon: Georgia Lopes, DDS;  [facility]: [facility];  Service: Oral Surgery;  Laterality: Right;   TOOTH EXTRACTION N/A 03/06/2018   Procedure: EXTRACTION X 1;  Surgeon: Ocie Doyne, DDS;  Location: [facility];  Service: Oral Surgery;  Laterality: N/A;   TOOTH EXTRACTION N/A 12/20/2020   Procedure: DENTAL RESTORATION/EXTRACTIONS TEETH THIRTEEN AND NINETEEN.;  Surgeon: Ocie Doyne, DMD;  Location: [facility];  Service: Oral Surgery;  Laterality: N/A;    There were no vitals filed for this visit.   Subjective Assessment - 05/03/21 1641     Subjective Pt is non verbal does not express any pain issues                   LYMPHEDEMA/ONCOLOGY QUESTIONNAIRE - 05/03/21 0001       Right Lower Extremity Lymphedema   20 cm Proximal to  Suprapatella 81 cm    10 cm Proximal to Suprapatella 73 cm    At Midpatella/Popliteal Crease 53.7 cm    30 cm Proximal to Floor at Lateral Plantar Foot 48 cm    20 cm Proximal to Floor at Lateral Plantar Foot 42 1    10 cm Proximal to Floor at Lateral Malleoli 35.8 cm    Circumference of ankle/heel 44.2 cm.    5 cm Proximal to 1st MTP Joint 31 cm    Across MTP Joint 29 cm      Left Lower Extremity Lymphedema   20 cm Proximal to Suprapatella 80 cm    10 cm Proximal to Suprapatella 70 cm    At Midpatella/Popliteal Crease 56 cm    30 cm Proximal to Floor at Lateral Plantar Foot 44 cm    20 cm Proximal to Floor at Lateral Plantar Foot 35.5 cm    10 cm Proximal to Floor at Lateral Malleoli 28.5 cm    Circumference of ankle/heel 41.2 cm.    5 cm Proximal to 1st MTP Joint 30.5 cm    Across MTP Joint 29 cm  [facility] Adult PT Treatment/Exercise - 05/03/21 0001       Manual Therapy   Manual Therapy Manual Lymphatic Drainage (MLD);Compression Bandaging    Manual therapy comments completed seperate from all other aspects of treatment    Manual Lymphatic Drainage (MLD) to include supraclavicular, deep and superfical abdominal, B  inguinal/axillary anastomosis and B LE with postreior completed in prone.    Compression Bandaging For RT LE only/  using foam and multilayer compression bandages from toes to thigh level.                       PT Short Term Goals - 05/03/21 1645       PT SHORT TERM GOAL #1   Title PT Rt LE measurements at 30/20/and 10 cm to decrease  1cm to reduce risk of cellulitis.    Time 2    Period Weeks    Status On-going    Target Date 05/08/21      PT SHORT TERM GOAL #2   Title PT mother to state that they are completing the LE exercises at home at least 3x/wk to increase lymphatic circulation    Time 3    Status On-going   mother states she tries but her son gets stubborn.              PT Long Term Goals -  05/03/21 1646       PT LONG TERM GOAL #1   Title PT Rt LE at 30,20 and 10 cm measurements to be decreased  by 2-3 cm to reduce risk of cellulitis.    Time 6    Period Weeks    Status On-going      PT LONG TERM GOAL #2   Title PT to have a new compresion garment and mother to be able to don    Time 4    Period Weeks    Status On-going                   Plan - 05/03/21 1643     Clinical Impression Statement Pt measured with mixed results.  PT will continue to benefit from skilled physical therapy for total decongestive techniqes.    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

## 2021-05-05 ENCOUNTER — Telehealth (HOSPITAL_COMMUNITY): Payer: Self-pay | Admitting: Physical Therapy

## 2021-05-05 ENCOUNTER — Ambulatory Visit (HOSPITAL_COMMUNITY): Payer: Medicare Other | Admitting: Physical Therapy

## 2021-05-05 ENCOUNTER — Other Ambulatory Visit: Payer: Self-pay

## 2021-05-05 NOTE — Telephone Encounter (Signed)
Called pt re missed appointment .  No answer.  Weather is bad and this pt normally shows.   Virgina Organ, PT CLT [phone]

## 2021-05-08 ENCOUNTER — Other Ambulatory Visit: Payer: Self-pay

## 2021-05-08 ENCOUNTER — Ambulatory Visit (HOSPITAL_COMMUNITY): Payer: Medicare Other | Attending: Nurse Practitioner | Admitting: Physical Therapy

## 2021-05-08 DIAGNOSIS — I890 Lymphedema, not elsewhere classified: Secondary | ICD-10-CM | POA: Insufficient documentation

## 2021-05-08 NOTE — Therapy (Signed)
[facility] [address] [place], [place], [address] Phone: [phone]   Fax:  [phone]  Physical Therapy Treatment  Patient Details  Name: Scott Mendez MRN: [id number] Date of Birth: 01/04/1981 Referring Provider (PT): Bjorn Pippin   Encounter Date: 05/08/2021   PT End of Session - 05/08/21 1735     Visit Number 6    Number of Visits 12    Date for PT Re-Evaluation 05/24/21    Authorization Type medicare    Progress Note Due on Visit 10    PT Start Time 1616    PT Stop Time 1710    PT Time Calculation (min) 54 min             Past Medical History:  Diagnosis Date   Autism    Hypertension    states under control with meds., has been on med. x 8 yr.   Morbid obesity with BMI of 45.0-49.9, adult (HCC)    Nonrestorable tooth 02/2018   x 2   Nonverbal    Sleep apnea    mild - no CPAP    Past Surgical History:  Procedure Laterality Date   CYST EXCISION Right 03/03/2002   mandible   MULTIPLE TOOTH EXTRACTIONS  03/03/2002   #1, 16, 17, 30, 32   TOENAIL EXCISION Bilateral    great toe   TOOTH EXTRACTION Right 09/03/2014   Procedure: EXTRACTION MOLAR LOWER RIGHT;  Surgeon: Georgia Lopes, DDS;  Location: [facility];  Service: Oral Surgery;  Laterality: Right;   TOOTH EXTRACTION N/A 03/06/2018   Procedure: EXTRACTION X 1;  Surgeon: Ocie Doyne, DDS;  Location: [facility];  Service: Oral Surgery;  Laterality: N/A;   TOOTH EXTRACTION N/A 12/20/2020   Procedure: DENTAL RESTORATION/EXTRACTIONS TEETH THIRTEEN AND NINETEEN.;  Surgeon: Ocie Doyne, DMD;  Location: [facility];  Service: Oral Surgery;  Laterality: N/A;    There were no vitals filed for this visit.                      [facility] Adult PT Treatment/Exercise - 05/08/21 0001       Manual Therapy   Manual Therapy Manual Lymphatic Drainage (MLD);Compression Bandaging    Manual therapy comments completed seperate from all other aspects  of treatment    Manual Lymphatic Drainage (MLD) to include supraclavicular, deep and superfical abdominal, B  inguinal/axillary anastomosis and B LE with postreior completed in prone.    Compression Bandaging For RT LE only/  using foam and multilayer compression bandages from toes to thigh level.                       PT Short Term Goals - 05/03/21 1645       PT SHORT TERM GOAL #1   Title PT Rt LE measurements at 30/20/and 10 cm to decrease  1cm to reduce risk of cellulitis.    Time 2    Period Weeks    Status On-going    Target Date 05/08/21      PT SHORT TERM GOAL #2   Title PT mother to state that they are completing the LE exercises at home at least 3x/wk to increase lymphatic circulation    Time 3    Status On-going   mother states she tries but her son gets stubborn.              PT Long Term Goals - 05/03/21 1646  PT LONG TERM GOAL #1   Title PT Rt LE at 30,20 and 10 cm measurements to be decreased  by 2-3 cm to reduce risk of cellulitis.    Time 6    Period Weeks    Status On-going      PT LONG TERM GOAL #2   Title PT to have a new compresion garment and mother to be able to don    Time 4    Period Weeks    Status On-going                   Plan - 05/08/21 1738     Clinical Impression Statement Pt came back without mom for treatment today.  Noted 2 short stretch bandages were missing and notified mom after treatment as she had left the premises.  Pt was only given a 45 minute treatment spot so completed 30 minutes of manual then completed bandaging to Rt LE.  Improvised with only 2 bandages distal LE and 3 on thigh.    Personal Factors and Comorbidities Fitness    Examination-Activity Limitations Dressing;Locomotion Level    PT Next Visit Plan Total decongestive techniques For Right LE, manual for Bil LE's.  Measure  Wednesdays.             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

## 2021-05-09 ENCOUNTER — Ambulatory Visit: Payer: Medicare Other | Admitting: Family Medicine

## 2021-05-10 ENCOUNTER — Encounter (HOSPITAL_COMMUNITY): Payer: Self-pay | Admitting: Physical Therapy

## 2021-05-10 ENCOUNTER — Other Ambulatory Visit: Payer: Self-pay

## 2021-05-10 ENCOUNTER — Ambulatory Visit (HOSPITAL_COMMUNITY): Payer: Medicare Other | Admitting: Physical Therapy

## 2021-05-10 DIAGNOSIS — I890 Lymphedema, not elsewhere classified: Secondary | ICD-10-CM | POA: Diagnosis not present

## 2021-05-10 NOTE — Therapy (Signed)
[facility] [address] [place], [place], [address] Phone: [phone]   Fax:  [phone]  Physical Therapy Treatment  Patient Details  Name: Scott Mendez MRN: [id number] Date of Birth: 06-15-1981 Referring Provider (PT): Bjorn Pippin   Encounter Date: 05/10/2021   PT End of Session - 05/10/21 1644     Visit Number 7    Number of Visits 12    Date for PT Re-Evaluation 05/24/21    Authorization Type medicare    Progress Note Due on Visit 10    PT Start Time 1530    PT Stop Time 1638    PT Time Calculation (min) 68 min             Past Medical History:  Diagnosis Date   Autism    Hypertension    states under control with meds., has been on med. x 8 yr.   Morbid obesity with BMI of 45.0-49.9, adult (HCC)    Nonrestorable tooth 02/2018   x 2   Nonverbal    Sleep apnea    mild - no CPAP    Past Surgical History:  Procedure Laterality Date   CYST EXCISION Right 03/03/2002   mandible   MULTIPLE TOOTH EXTRACTIONS  03/03/2002   #1, 16, 17, 30, 32   TOENAIL EXCISION Bilateral    great toe   TOOTH EXTRACTION Right 09/03/2014   Procedure: EXTRACTION MOLAR LOWER RIGHT;  Surgeon: Georgia Lopes, DDS;  Location: [facility];  Service: Oral Surgery;  Laterality: Right;   TOOTH EXTRACTION N/A 03/06/2018   Procedure: EXTRACTION X 1;  Surgeon: Ocie Doyne, DDS;  Location: [facility];  Service: Oral Surgery;  Laterality: N/A;   TOOTH EXTRACTION N/A 12/20/2020   Procedure: DENTAL RESTORATION/EXTRACTIONS TEETH THIRTEEN AND NINETEEN.;  Surgeon: Ocie Doyne, DMD;  Location: [facility];  Service: Oral Surgery;  Laterality: N/A;    There were no vitals filed for this visit.   Subjective Assessment - 05/10/21 1642     Subjective PT is non verbal ; no facial expression of pain. Pt's mother states that she can tell a difference in her son's leg size.                   LYMPHEDEMA/ONCOLOGY QUESTIONNAIRE - 05/10/21  0001       Right Lower Extremity Lymphedema   20 cm Proximal to Suprapatella 79 cm    10 cm Proximal to Suprapatella 68 cm    At Midpatella/Popliteal Crease 52.4 cm    30 cm Proximal to Floor at Lateral Plantar Foot 47 cm    20 cm Proximal to Floor at Lateral Plantar Foot 41.5 1    10  cm Proximal to Floor at Lateral Malleoli 37 cm    Circumference of ankle/heel 45.3 cm.    5 cm Proximal to 1st MTP Joint 32 cm    Across MTP Joint 29.5 cm                        [facility] Adult PT Treatment/Exercise - 05/10/21 0001       Manual Therapy   Manual Therapy Manual Lymphatic Drainage (MLD);Compression Bandaging    Manual therapy comments completed seperate from all other aspects of treatment    Manual Lymphatic Drainage (MLD) to include supraclavicular, deep and superfical abdominal, B  inguinal/axillary anastomosis and B LE with postreior completed in prone.    Compression Bandaging For RT LE only/  using foam and multilayer compression bandages from toes to thigh level.                       PT Short Term Goals - 05/03/21 1645       PT SHORT TERM GOAL #1   Title PT Rt LE measurements at 30/20/and 10 cm to decrease  1cm to reduce risk of cellulitis.    Time 2    Period Weeks    Status On-going    Target Date 05/08/21      PT SHORT TERM GOAL #2   Title PT mother to state that they are completing the LE exercises at home at least 3x/wk to increase lymphatic circulation    Time 3    Status On-going   mother states she tries but her son gets stubborn.              PT Long Term Goals - 05/03/21 1646       PT LONG TERM GOAL #1   Title PT Rt LE at 30,20 and 10 cm measurements to be decreased  by 2-3 cm to reduce risk of cellulitis.    Time 6    Period Weeks    Status On-going      PT LONG TERM GOAL #2   Title PT to have a new compresion garment and mother to be able to don    Time 4    Period Weeks    Status On-going                    Plan - 05/10/21 1644     Clinical Impression Statement Measurement of Rt LE demonstrates good reduction of LE, slight increase at foot. Pt will continue to benefit from Total decongestive techniques as long as noted progress is being made.    Personal Factors and Comorbidities Fitness    Examination-Activity Limitations Dressing;Locomotion Level    PT Next Visit Plan Total decongestive techniques For Right LE, manual for Bil LE's.  Measure  Wednesdays.             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 [place], [place], [address] Phone: [phone]   Fax:  [phone]  Name: Scott Mendez MRN: [id number] Date of Birth: [birth year]

## 2021-05-11 ENCOUNTER — Ambulatory Visit (INDEPENDENT_AMBULATORY_CARE_PROVIDER_SITE_OTHER): Payer: Medicare Other | Admitting: Family Medicine

## 2021-05-11 ENCOUNTER — Encounter: Payer: Self-pay | Admitting: Family Medicine

## 2021-05-11 VITALS — BP 122/80 | HR 105 | Resp 16 | Ht 71.0 in | Wt 373.0 lb

## 2021-05-11 DIAGNOSIS — J302 Other seasonal allergic rhinitis: Secondary | ICD-10-CM

## 2021-05-11 DIAGNOSIS — F5104 Psychophysiologic insomnia: Secondary | ICD-10-CM

## 2021-05-11 DIAGNOSIS — R7301 Impaired fasting glucose: Secondary | ICD-10-CM

## 2021-05-11 DIAGNOSIS — I10 Essential (primary) hypertension: Secondary | ICD-10-CM

## 2021-05-11 DIAGNOSIS — Z23 Encounter for immunization: Secondary | ICD-10-CM

## 2021-05-11 DIAGNOSIS — R7303 Prediabetes: Secondary | ICD-10-CM | POA: Diagnosis not present

## 2021-05-11 NOTE — Patient Instructions (Addendum)
F/U In 5 months, call if you need me sooner  CMP and EGFR and hBa1C today'   Excellent blood pressure    Flu vaccine today  Covid vaccine as soon as possible at your pharmacy  Please cut out sweets and starchy foods, increase vegetables  Thanks for choosing [facility], we consider it a privelige to serve you.

## 2021-05-11 NOTE — Progress Notes (Signed)
Scott Mendez     MRN: [id number]      DOB: 09/30/1980   HPI Scott Mendez is here for follow up and re-evaluation of chronic medical conditions, medication management and review of any available recent lab and radiology data.  Preventive health is updated, specifically  Cancer screening and Immunization.   Resumed PT  three times weekly since  approx mid September PT denies any adverse reactions to current medications since the last visit.  There are no new concerns.  There are no specific complaints   ROS Denies recent fever or chills. Denies sinus pressure, nasal congestion, ear pain or sore throat. Denies chest congestion, productive cough or wheezing. Denies chest pains, palpitations and leg swelling Denies abdominal pain, nausea, vomiting,diarrhea or constipation.   Denies dysuria, frequency, hesitancy or incontinence. Denies joint pain, swelling and limitation in mobility. Denies headaches, seizures, numbness, or tingling. Denies depression, anxiety or insomnia. Denies skin break down or rash.   PE  BP 122/80   Pulse (!) 105   Resp 16   Ht 5\' 11"  (1.803 m)   Wt (!) 373 lb (169.2 kg)   SpO2 92%   BMI 52.02 kg/m   Patient alert and oriented and in no cardiopulmonary distress.  HEENT: No facial asymmetry, EOMI,     Neck supple .  Chest: Clear to auscultation bilaterally.  CVS: S1, S2 no murmurs, no S3.Regular rate.  ABD: Soft non tender.   Ext: No edema  MS: Adequate ROM spine, shoulders, hips and knees.  Skin: Intact, no ulcerations or rash noted.  Psych: Good eye contact, normal affect. Memory intact not anxious or depressed appearing.  CNS: CN 2-12 intact, power,  normal throughout.no focal deficits noted.   Assessment & Plan  Essential hypertension Controlled, no change in medication DASH diet and commitment to daily physical activity for a minimum of 30 minutes discussed and encouraged, as a part of hypertension management. The  importance of attaining a healthy weight is also discussed.  BP/Weight 05/11/2021 03/24/2021 12/20/2020 12/06/2020 11/08/2020 10/04/2020 06/23/2020  Systolic BP 122 132 134 134 148 138 132  Diastolic BP 80 85 89 82 87 90 83  Wt. (Lbs) 373 368 351 370 370 374 371  BMI 52.02 51.33 48.95 51.6 51.6 52.16 54.79  Some encounter information is confidential and restricted. Go to Review Flowsheets activity to see all data.       Prediabetes [name] Patient educated about the importance of limiting  Carbohydrate intake , the need to commit to daily physical activity for a minimum of 30 minutes , and to commit weight loss. The fact that changes in all these areas will reduce or eliminate all together the development of diabetes is stressed.   Diabetic Labs Latest Ref Rng & Units 05/11/2021 11/07/2020 11/04/2019 12/31/2018 07/07/2018  HbA1c 4.8 - 5.6 % 5.9(H) - 5.3 - -  Chol 100 - 199 mg/dL - 14/09/2017 948 546 -  HDL 270 mg/dL - >35) 40 00(X) -  Calc LDL 0 - 99 mg/dL - 82 92 87 -  Triglycerides 0 - 149 mg/dL - 38(H 91 79 -  Creatinine 0.76 - 1.27 mg/dL 829 9.37 1.69 6.78 9.38   BP/Weight 05/11/2021 03/24/2021 12/20/2020 12/06/2020 11/08/2020 10/04/2020 06/23/2020  Systolic BP 122 132 134 134 148 138 132  Diastolic BP 80 85 89 82 87 90 83  Wt. (Lbs) 373 368 351 370 370 374 371  BMI 52.02 51.33 48.95 51.6 51.6 52.16 54.79  Some encounter information is  confidential and restricted. Go to Review Flowsheets activity to see all data.   No flowsheet data found.    Morbid obesity  Patient re-educated about  the importance of commitment to a  minimum of 150 minutes of exercise per week as able.  The importance of healthy food choices with portion control discussed, as well as eating regularly and within a 12 hour window most days. The need to choose "clean , green" food 50 to 75% of the time is discussed, as well as to make water the primary drink and set a goal of 64 ounces water daily.    Weight /BMI 05/11/2021  03/24/2021 12/20/2020  WEIGHT 373 lb 368 lb 351 lb  HEIGHT 5\' 11"  5\' 11"  5\' 11"   BMI 52.02 kg/m2 51.33 kg/m2 48.95 kg/m2  Some encounter information is confidential and restricted. Go to Review Flowsheets activity to see all data.      Insomnia Controlled, no change in medication   Seasonal allergies Controlled, no change in medication

## 2021-05-12 ENCOUNTER — Ambulatory Visit (HOSPITAL_COMMUNITY): Payer: Medicare Other | Admitting: Physical Therapy

## 2021-05-12 ENCOUNTER — Other Ambulatory Visit: Payer: Self-pay

## 2021-05-12 DIAGNOSIS — I890 Lymphedema, not elsewhere classified: Secondary | ICD-10-CM | POA: Diagnosis not present

## 2021-05-12 LAB — CMP14+EGFR
ALT: 38 IU/L (ref 0–44)
AST: 26 IU/L (ref 0–40)
Albumin/Globulin Ratio: 2.2 (ref 1.2–2.2)
Albumin: 4.3 g/dL (ref 4.0–5.0)
Alkaline Phosphatase: 104 IU/L (ref 44–121)
BUN/Creatinine Ratio: 15 (ref 9–20)
BUN: 14 mg/dL (ref 6–20)
Bilirubin Total: 0.4 mg/dL (ref 0.0–1.2)
CO2: 24 mmol/L (ref 20–29)
Calcium: 9.1 mg/dL (ref 8.7–10.2)
Chloride: 100 mmol/L (ref 96–106)
Creatinine, Ser: 0.93 mg/dL (ref 0.76–1.27)
Globulin, Total: 2 g/dL (ref 1.5–4.5)
Glucose: 112 mg/dL — ABNORMAL HIGH (ref 70–99)
Potassium: 4.1 mmol/L (ref 3.5–5.2)
Sodium: 140 mmol/L (ref 134–144)
Total Protein: 6.3 g/dL (ref 6.0–8.5)
eGFR: 108 mL/min/{1.73_m2} (ref >59)

## 2021-05-12 LAB — HEMOGLOBIN A1C
Est. average glucose Bld gHb Est-mCnc: 123 mg/dL
Hgb A1c MFr Bld: 5.9 % — ABNORMAL HIGH (ref 4.8–5.6)

## 2021-05-12 NOTE — Therapy (Signed)
[facility] [address] [place], [place], [address] Phone: [phone]   Fax:  [phone]  Physical Therapy Treatment  Patient Details  Name: Scott Mendez MRN: [id number] Date of Birth: [birth year] Referring Provider (PT): Demetrius Revel   Encounter Date: 05/12/2021   PT End of Session - 05/12/21 1523     Visit Number 8    Number of Visits 12    Date for PT Re-Evaluation 05/24/21    Authorization Type medicare    Progress Note Due on Visit 10    PT Start Time 1525    PT Stop Time 1630    PT Time Calculation (min) 65 min    Activity Tolerance Patient tolerated treatment well             Past Medical History:  Diagnosis Date   Autism    Hypertension    states under control with meds., has been on med. x 8 yr.   Morbid obesity with BMI of 45.0-49.9, adult ([place])    Nonrestorable tooth 02/2018   x 2   Nonverbal    Sleep apnea    mild - no CPAP    Past Surgical History:  Procedure Laterality Date   CYST EXCISION Right 03/03/2002   mandible   MULTIPLE TOOTH EXTRACTIONS  03/03/2002   #1, 16, 17, 30, 32   TOENAIL EXCISION Bilateral    great toe   TOOTH EXTRACTION Right 09/03/2014   Procedure: EXTRACTION MOLAR LOWER RIGHT;  Surgeon: Gae Bon, DDS;  Location: [facility];  Service: Oral Surgery;  Laterality: Right;   TOOTH EXTRACTION N/A 03/06/2018   Procedure: EXTRACTION X 1;  Surgeon: Diona Browner, DDS;  Location: [facility];  Service: Oral Surgery;  Laterality: N/A;   TOOTH EXTRACTION N/A 12/20/2020   Procedure: DENTAL RESTORATION/EXTRACTIONS TEETH THIRTEEN AND NINETEEN.;  Surgeon: Diona Browner, DMD;  Location: [facility];  Service: Oral Surgery;  Laterality: N/A;    There were no vitals filed for this visit.   Subjective Assessment - 05/12/21 1523     Subjective PT is non verbal ; no facial expression of pain. Pt's mother states that she can tell a difference in her son's leg size.    Currently in  Pain? No/denies                               [facility] Adult PT Treatment/Exercise - 05/12/21 0001       Manual Therapy   Manual Therapy Manual Lymphatic Drainage (MLD);Compression Bandaging    Manual therapy comments completed seperate from all other aspects of treatment    Manual Lymphatic Drainage (MLD) to include supraclavicular, deep and superfical abdominal, B  inguinal/axillary anastomosis and B LE with postreior completed in prone.    Compression Bandaging For RT LE only/  using foam and multilayer compression bandages from foot to thigh level.                       PT Short Term Goals - 05/12/21 1634       PT SHORT TERM GOAL #1   Title PT Rt LE measurements at 30/20/and 10 cm to decrease  1cm to reduce risk of cellulitis.    Time 2    Period Weeks    Status Achieved    Target Date 05/08/21      PT SHORT TERM GOAL #2   Title  PT mother to state that they are completing the LE exercises at home at least 3x/wk to increase lymphatic circulation    Time 3    Status On-going   mother states she tries but her son gets stubborn.              PT Long Term Goals - 05/12/21 1634       PT LONG TERM GOAL #1   Title PT Rt LE at 30,20 and 10 cm measurements to be decreased  by 2-3 cm to reduce risk of cellulitis.    Time 6    Period Weeks    Status On-going      PT LONG TERM GOAL #2   Title PT to have a new compresion garment and mother to be able to don    Time 4    Period Weeks    Status Partially Met   mother has called and ordered, however, Comp[facility] outlet center does not have his size at this time.                  Plan - 05/12/21 1632     Clinical Impression Statement Pt with noted decreased induration on posterior aspect of Rt LE. PT continues to benefit from Total decongestive techniques and will be measured weekly    Personal Factors and Comorbidities Fitness    Examination-Activity Limitations Dressing;Locomotion  Level    Examination-Participation Restrictions Community Activity    Stability/Clinical Decision Making Evolving/Moderate complexity    Clinical Decision Making Moderate    Rehab Potential Good    PT Frequency 3x / week    PT Duration 4 weeks    PT Treatment/Interventions Manual techniques;Manual lymph drainage;Patient/family education    PT Next Visit Plan Total decongestive techniques For Right LE, manual for Bil LE's.  Measure  Wednesdays.             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

## 2021-05-13 ENCOUNTER — Encounter: Payer: Self-pay | Admitting: Family Medicine

## 2021-05-13 NOTE — Assessment & Plan Note (Signed)
Controlled, no change in medication DASH diet and commitment to daily physical activity for a minimum of 30 minutes discussed and encouraged, as a part of hypertension management. The importance of attaining a healthy weight is also discussed.  BP/Weight 05/11/2021 03/24/2021 12/20/2020 12/06/2020 11/08/2020 10/04/2020 06/23/2020  Systolic BP 122 132 134 134 148 138 132  Diastolic BP 80 85 89 82 87 90 83  Wt. (Lbs) 373 368 351 370 370 374 371  BMI 52.02 51.33 48.95 51.6 51.6 52.16 54.79  Some encounter information is confidential and restricted. Go to Review Flowsheets activity to see all data.

## 2021-05-13 NOTE — Assessment & Plan Note (Signed)
  Patient re-educated about  the importance of commitment to a  minimum of 150 minutes of exercise per week as able.  The importance of healthy food choices with portion control discussed, as well as eating regularly and within a 12 hour window most days. The need to choose "clean , green" food 50 to 75% of the time is discussed, as well as to make water the primary drink and set a goal of 64 ounces water daily.    Weight /BMI 05/11/2021 03/24/2021 12/20/2020  WEIGHT 373 lb 368 lb 351 lb  HEIGHT 5\' 11"  5\' 11"  5\' 11"   BMI 52.02 kg/m2 51.33 kg/m2 48.95 kg/m2  Some encounter information is confidential and restricted. Go to Review Flowsheets activity to see all data.

## 2021-05-13 NOTE — Assessment & Plan Note (Signed)
Controlled, no change in medication  

## 2021-05-13 NOTE — Assessment & Plan Note (Signed)
[name] Patient educated about the importance of limiting  Carbohydrate intake , the need to commit to daily physical activity for a minimum of 30 minutes , and to commit weight loss. The fact that changes in all these areas will reduce or eliminate all together the development of diabetes is stressed.   Diabetic Labs Latest Ref Rng & Units 05/11/2021 11/07/2020 11/04/2019 12/31/2018 07/07/2018  HbA1c 4.8 - 5.6 % 5.9(H) - 5.3 - -  Chol 100 - 199 mg/dL - 737 106 269 -  HDL >48 mg/dL - 54(O) 40 27(O) -  Calc LDL 0 - 99 mg/dL - 82 92 87 -  Triglycerides 0 - 149 mg/dL - 350 91 79 -  Creatinine 0.76 - 1.27 mg/dL 0.93 8.18 2.99 3.71 6.96   BP/Weight 05/11/2021 03/24/2021 12/20/2020 12/06/2020 11/08/2020 10/04/2020 06/23/2020  Systolic BP 122 132 134 134 148 138 132  Diastolic BP 80 85 89 82 87 90 83  Wt. (Lbs) 373 368 351 370 370 374 371  BMI 52.02 51.33 48.95 51.6 51.6 52.16 54.79  Some encounter information is confidential and restricted. Go to Review Flowsheets activity to see all data.   No flowsheet data found.

## 2021-05-15 NOTE — Progress Notes (Unsigned)
This encounter was created in error - please disregard.  This encounter was created in error - please disregard.

## 2021-05-16 ENCOUNTER — Other Ambulatory Visit: Payer: Self-pay

## 2021-05-16 ENCOUNTER — Encounter (HOSPITAL_COMMUNITY): Payer: Self-pay

## 2021-05-16 ENCOUNTER — Ambulatory Visit (HOSPITAL_COMMUNITY): Payer: Medicare Other

## 2021-05-16 DIAGNOSIS — I890 Lymphedema, not elsewhere classified: Secondary | ICD-10-CM

## 2021-05-16 NOTE — Therapy (Signed)
[facility] [address] [place], [place], [address] Phone: [phone]   Fax:  [phone]  Physical Therapy Treatment  Patient Details  Name: Scott Mendez MRN: [id number] Date of Birth: 1981-05-09 Referring Provider (PT): Demetrius Revel   Encounter Date: 05/16/2021   PT End of Session - 05/16/21 0900     Visit Number 9    Number of Visits 12    Date for PT Re-Evaluation 05/24/21    Authorization Type medicare    Progress Note Due on Visit 10    PT Start Time 0830    PT Stop Time 0858    PT Time Calculation (min) 28 min    Activity Tolerance Patient tolerated treatment well    Behavior During Therapy [facility] for tasks assessed/performed   no verbal            Past Medical History:  Diagnosis Date   Autism    Hypertension    states under control with meds., has been on med. x 8 yr.   Morbid obesity with BMI of 45.0-49.9, adult ([place])    Nonrestorable tooth 02/2018   x 2   Nonverbal    Sleep apnea    mild - no CPAP    Past Surgical History:  Procedure Laterality Date   CYST EXCISION Right 03/03/2002   mandible   MULTIPLE TOOTH EXTRACTIONS  03/03/2002   #1, 16, 17, 30, 32   TOENAIL EXCISION Bilateral    great toe   TOOTH EXTRACTION Right 09/03/2014   Procedure: EXTRACTION MOLAR LOWER RIGHT;  Surgeon: Gae Bon, DDS;  Location: [facility];  Service: Oral Surgery;  Laterality: Right;   TOOTH EXTRACTION N/A 03/06/2018   Procedure: EXTRACTION X 1;  Surgeon: Diona Browner, DDS;  Location: [facility];  Service: Oral Surgery;  Laterality: N/A;   TOOTH EXTRACTION N/A 12/20/2020   Procedure: DENTAL RESTORATION/EXTRACTIONS TEETH THIRTEEN AND NINETEEN.;  Surgeon: Diona Browner, DMD;  Location: [facility];  Service: Oral Surgery;  Laterality: N/A;    There were no vitals filed for this visit.   Subjective Assessment - 05/16/21 0859     Subjective PT is non verbal ; no facial expression of pain. Mother not present  for this session.    Currently in Pain? No/denies                               [facility] Adult PT Treatment/Exercise - 05/16/21 0001       Manual Therapy   Manual Therapy Compression Bandaging    Manual therapy comments completed seperate from all other aspects of treatment    Manual Lymphatic Drainage (MLD) no manual complete this session due to time restraints    Compression Bandaging For RT LE only/  using foam and multilayer compression bandages from toes to thigh level.                       PT Short Term Goals - 05/12/21 1634       PT SHORT TERM GOAL #1   Title PT Rt LE measurements at 30/20/and 10 cm to decrease  1cm to reduce risk of cellulitis.    Time 2    Period Weeks    Status Achieved    Target Date 05/08/21      PT SHORT TERM GOAL #2   Title PT mother to state that they are completing  the LE exercises at home at least 3x/wk to increase lymphatic circulation    Time 3    Status On-going   mother states she tries but her son gets stubborn.              PT Long Term Goals - 05/12/21 1634       PT LONG TERM GOAL #1   Title PT Rt LE at 30,20 and 10 cm measurements to be decreased  by 2-3 cm to reduce risk of cellulitis.    Time 6    Period Weeks    Status On-going      PT LONG TERM GOAL #2   Title PT to have a new compresion garment and mother to be able to don    Time 4    Period Weeks    Status Partially Met   mother has called and ordered, however, Compression outlet center does not have his size at this time.                  Plan - 05/16/21 0901     Clinical Impression Statement Application of multilayer short stretch bandages with 1/2in to Rt LE thigh high for edema control.  Time restraints, no manual technqiues complete this session.    Personal Factors and Comorbidities Fitness    Examination-Activity Limitations Dressing;Locomotion Level    Examination-Participation Restrictions Community Activity     Stability/Clinical Decision Making Evolving/Moderate complexity    Clinical Decision Making Moderate    Rehab Potential Good    PT Frequency 3x / week    PT Duration 4 weeks    PT Treatment/Interventions Manual techniques;Manual lymph drainage;Patient/family education    PT Next Visit Plan Total decongestive techniques For Right LE, manual for Bil LE's.  Measure  Wednesdays.    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

## 2021-05-18 ENCOUNTER — Encounter (HOSPITAL_COMMUNITY): Payer: Self-pay | Admitting: Physical Therapy

## 2021-05-23 ENCOUNTER — Telehealth (HOSPITAL_COMMUNITY): Payer: Self-pay | Admitting: Physical Therapy

## 2021-05-23 ENCOUNTER — Ambulatory Visit (HOSPITAL_COMMUNITY): Payer: Medicare Other | Admitting: Physical Therapy

## 2021-05-23 ENCOUNTER — Other Ambulatory Visit: Payer: Self-pay

## 2021-05-23 DIAGNOSIS — I890 Lymphedema, not elsewhere classified: Secondary | ICD-10-CM | POA: Diagnosis not present

## 2021-05-23 NOTE — Therapy (Signed)
[facility] [address] [place], [place], [address] Phone: [phone]   Fax:  [phone]  Physical Therapy Treatment  Patient Details  Name: Scott Mendez MRN: [id number] Date of Birth: 1981/03/31 Referring Provider (PT): Bjorn Pippin   Encounter Date: 05/23/2021   PT End of Session - 05/23/21 1513     Visit Number 10    Number of Visits 12    Date for PT Re-Evaluation 05/24/21    Authorization Type medicare    Progress Note Due on Visit 11    PT Start Time 1415    PT Stop Time 1505    PT Time Calculation (min) 50 min    Activity Tolerance Patient tolerated treatment well    Behavior During Therapy [facility] for tasks assessed/performed   no verbal            Past Medical History:  Diagnosis Date   Autism    Hypertension    states under control with meds., has been on med. x 8 yr.   Morbid obesity with BMI of 45.0-49.9, adult ([facility])    Nonrestorable tooth 02/2018   x 2   Nonverbal    Sleep apnea    mild - no CPAP    Past Surgical History:  Procedure Laterality Date   CYST EXCISION Right 03/03/2002   mandible   MULTIPLE TOOTH EXTRACTIONS  03/03/2002   #1, 16, 17, 30, 32   TOENAIL EXCISION Bilateral    great toe   TOOTH EXTRACTION Right 09/03/2014   Procedure: EXTRACTION MOLAR LOWER RIGHT;  Surgeon: Georgia Lopes, DDS;  Location[facility];  Service: Oral Surgery;  Laterality: Right;   TOOTH EXTRACTION N/A 03/06/2018   Procedure: EXTRACTION X 1;  Surgeon: Ocie Doyne, DDS;  Location: [facility];  Service: Oral Surgery;  Laterality: N/A;   TOOTH EXTRACTION N/A 12/20/2020   Procedure: DENTAL RESTORATION/EXTRACTIONS TEETH THIRTEEN AND NINETEEN.;  Surgeon: Ocie Doyne, DMD;  Location: [facility];  Service: Oral Surgery;  Laterality: N/A;    There were no vitals filed for this visit.   Subjective Assessment - 05/23/21 1514     Subjective PT mother states that she recieved the compression garments in the  mail.  THerapist noted that the garments had slid down,                      [facility] Adult PT Treatment/Exercise - 05/23/21 0001       Manual Therapy   Manual Therapy Compression Bandaging    Manual therapy comments completed seperate from all other aspects of treatment    Manual Lymphatic Drainage (MLD) to include supraclavicular, deep and superfical abdominal, B  inguinal/axillary anastomosis and B LE with postreior completed in prone.    Compression Bandaging For RT LE only/  using foam and multilayer compression bandages from foot to thigh level.                       PT Short Term Goals - 05/23/21 1517       PT SHORT TERM GOAL #1   Title PT Rt LE measurements at 30/20/and 10 cm to decrease  1cm to reduce risk of cellulitis.    Time 2    Period Weeks    Status Achieved    Target Date 05/08/21      PT SHORT TERM GOAL #2   Title PT mother to state that they are completing the  LE exercises at home at least 3x/wk to increase lymphatic circulation    Time 3    Status On-going   mother states she tries but her son gets stubborn.              PT Long Term Goals - 05/23/21 1517       PT LONG TERM GOAL #1   Title PT Rt LE at 30,20 and 10 cm measurements to be decreased  by 2-3 cm to reduce risk of cellulitis.    Time 6    Period Weeks    Status On-going      PT LONG TERM GOAL #2   Title PT to have a new compresion garment and mother to be able to don    Time 4    Period Weeks    Status Achieved   mother has called and ordered, however, Compression outlet center does not have his size at this time.                  Plan - 05/23/21 1514     Clinical Impression Statement PT with noted increased swelling.  PT has not been seen since Thursday and mother stated that she took the bandages off on Saturday.  Pt was not measured due to this will measure on Thursday when pt returns.  If pt compression socks have came down measure LE and see if pt  is in the correct size.    Personal Factors and Comorbidities Fitness    Examination-Activity Limitations Dressing;Locomotion Level    Examination-Participation Restrictions Community Activity    Stability/Clinical Decision Making Evolving/Moderate complexity    Clinical Decision Making Moderate    Rehab Potential Good    PT Frequency 3x / week    PT Duration 4 weeks    PT Treatment/Interventions Manual techniques;Manual lymph drainage;Patient/family education    PT Next Visit Plan If pt compression socks have came down measure LE and see if pt is in the correct size. 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 [place], [place], [address] Phone: [phone]   Fax:  [phone]  Name: DIONTA LARKE MRN: [id number] Date of Birth: [birth year]

## 2021-05-23 NOTE — Telephone Encounter (Signed)
Called re no show.  Left message that pt next appointment will be on Friday at 2:00  [place], PT CLT [phone]

## 2021-05-24 ENCOUNTER — Encounter (HOSPITAL_COMMUNITY): Payer: Self-pay

## 2021-05-25 ENCOUNTER — Ambulatory Visit (HOSPITAL_COMMUNITY): Payer: Medicare Other

## 2021-05-25 ENCOUNTER — Other Ambulatory Visit: Payer: Self-pay

## 2021-05-25 ENCOUNTER — Encounter (HOSPITAL_COMMUNITY): Payer: Self-pay

## 2021-05-25 DIAGNOSIS — I890 Lymphedema, not elsewhere classified: Secondary | ICD-10-CM | POA: Diagnosis not present

## 2021-05-25 NOTE — Therapy (Addendum)
[facility] [address] [place], [place], [address] Phone: [phone]   Fax:  [phone]  Physical Therapy Treatment  Patient Details  Name: Scott Mendez MRN: [id number] Date of Birth: June 06, 1981 Referring Provider (PT): Demetrius Revel   Encounter Date: 05/25/2021   PT End of Session - 05/25/21 1854     Visit Number 11    Number of Visits 12    Authorization Type medicare    Progress Note Due on Visit 11    PT Start Time 6962    PT Stop Time 1445    PT Time Calculation (min) 42 min    Activity Tolerance Patient tolerated treatment well    Behavior During Therapy [facility] for tasks assessed/performed             Past Medical History:  Diagnosis Date   Autism    Hypertension    states under control with meds., has been on med. x 8 yr.   Morbid obesity with BMI of 45.0-49.9, adult ([place])    Nonrestorable tooth 02/2018   x 2   Nonverbal    Sleep apnea    mild - no CPAP    Past Surgical History:  Procedure Laterality Date   CYST EXCISION Right 03/03/2002   mandible   MULTIPLE TOOTH EXTRACTIONS  03/03/2002   #1, 16, 17, 30, 32   TOENAIL EXCISION Bilateral    great toe   TOOTH EXTRACTION Right 09/03/2014   Procedure: EXTRACTION MOLAR LOWER RIGHT;  Surgeon: Gae Bon, DDS;  Location: [place];  Service: Oral Surgery;  Laterality: Right;   TOOTH EXTRACTION N/A 03/06/2018   Procedure: EXTRACTION X 1;  Surgeon: Diona Browner, DDS;  Location: [place];  Service: Oral Surgery;  Laterality: N/A;   TOOTH EXTRACTION N/A 12/20/2020   Procedure: DENTAL RESTORATION/EXTRACTIONS TEETH THIRTEEN AND NINETEEN.;  Surgeon: Diona Browner, DMD;  Location: [place];  Service: Oral Surgery;  Laterality: N/A;    There were no vitals filed for this visit.   Subjective Assessment - 05/25/21 1849     Subjective Pt non-verbal and mother sat out in waiting room for session.  No facial expression or moaning to simulate any pain  through session.    Currently in Pain? No/denies                   LYMPHEDEMA/ONCOLOGY QUESTIONNAIRE - 05/25/21 0001       Right Lower Extremity Lymphedema   20 cm Proximal to Suprapatella 77 cm   initial eval 9/19 was: 81.5   10 cm Proximal to Suprapatella 66.8 cm   initial eval 9/19 was: 72.4   At Midpatella/Popliteal Crease 52 cm   initial eval 9/19 was: 54   30 cm Proximal to Floor at Lateral Plantar Foot 45.7 cm   initial eval 9/19 was: 48   20 cm Proximal to Floor at Lateral Plantar Foot 43.7 1   initial eval 9/19 was: 45.5   10 cm Proximal to Floor at Lateral Malleoli 35.2 cm   initial eval 9/19 was: 37   Circumference of ankle/heel 44.3 cm.   initial eval 9/19 was: 45   5 cm Proximal to 1st MTP Joint 32 cm   initial eval 9/19 was: 31.5   Across MTP Joint 30 cm   initial eval 9/19 was: 29                       [facility] Adult  PT Treatment/Exercise - 05/25/21 0001       Manual Therapy   Manual Therapy Manual Lymphatic Drainage (MLD);Compression Bandaging    Manual therapy comments Manual complete separate than rest of tx    Manual Lymphatic Drainage (MLD) to include supraclavicular, deep and superfical abdominal, B  inguinal/axillary anastomosis Rt LE anterior only due to time restraints    Compression Bandaging For RT LE only/  using foam and multilayer compression bandages from foot to thigh level.                       PT Short Term Goals - 05/23/21 1517       PT SHORT TERM GOAL #1   Title PT Rt LE measurements at 30/20/and 10 cm to decrease  1cm to reduce risk of cellulitis.    Time 2    Period Weeks    Status Achieved    Target Date 05/08/21      PT SHORT TERM GOAL #2   Title PT mother to state that they are completing the LE exercises at home at least 3x/wk to increase lymphatic circulation    Time 3    Status On-going   mother states she tries but her son gets stubborn.              PT Long Term Goals - 05/25/21 1857        PT LONG TERM GOAL #1   Title PT Rt LE at 30,20 and 10 cm measurements to be decreased  by 2-3 cm to reduce risk of cellulitis.    Baseline 10/20: distal average .7-->1.8 reduction; proximal reduced 2-->5cm    Status Partially Met      PT LONG TERM GOAL #2   Title PT to have a new compresion garment and mother to be able to don    Status Achieved                   Plan - 05/25/21 1858     Clinical Impression Statement Measurements taken with average reduction of edema .7-->1.8cm distally and 2-->5cm reduction more proximal.  Pt non-verbal and mother sat in waiting room so unsure with compliance with HEP and use of pump.  Need to ask mother if compression socks are good fit or if he needs correct size.  If good size, anticipate DC to self care next couple sessions.    Personal Factors and Comorbidities Fitness    Examination-Activity Limitations Dressing;Locomotion Level    Examination-Participation Restrictions Community Activity    Stability/Clinical Decision Making Evolving/Moderate complexity    Clinical Decision Making Moderate    Rehab Potential Good    PT Frequency 3x / week    PT Duration 4 weeks    PT Treatment/Interventions Manual techniques;Manual lymph drainage;Patient/family education    PT Next Visit Plan F/U: If pt compression socks have came down measure LE and see if pt is in the correct size. 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

## 2021-05-26 ENCOUNTER — Encounter (HOSPITAL_COMMUNITY): Payer: Self-pay

## 2021-05-29 ENCOUNTER — Other Ambulatory Visit: Payer: Self-pay

## 2021-05-29 ENCOUNTER — Encounter (HOSPITAL_COMMUNITY): Payer: Self-pay | Admitting: Physical Therapy

## 2021-05-29 ENCOUNTER — Ambulatory Visit (HOSPITAL_COMMUNITY): Payer: Medicare Other | Admitting: Physical Therapy

## 2021-05-29 DIAGNOSIS — I890 Lymphedema, not elsewhere classified: Secondary | ICD-10-CM

## 2021-05-29 NOTE — Therapy (Signed)
[facility] [place], [place], [address] Phone: [phone]   Fax:  [phone]  Physical Therapy Treatment  Patient Details  Name: Scott Mendez MRN: [id number] Date of Birth: June 19, 1981 Referring Provider (PT): Demetrius Revel   Encounter Date: 05/29/2021   PT End of Session - 05/29/21 1701     Visit Number 12    Number of Visits 18    Date for PT Re-Evaluation 06/25/21    Authorization Type medicare    Progress Note Due on Visit 18    PT Start Time 1010    PT Stop Time 1050    PT Time Calculation (min) 40 min    Activity Tolerance Patient tolerated treatment well    Behavior During Therapy [facility] for tasks assessed/performed             Past Medical History:  Diagnosis Date   Autism    Hypertension    states under control with meds., has been on med. x 8 yr.   Morbid obesity with BMI of 45.0-49.9, adult ([place])    Nonrestorable tooth 02/2018   x 2   Nonverbal    Sleep apnea    mild - no CPAP    Past Surgical History:  Procedure Laterality Date   CYST EXCISION Right 03/03/2002   mandible   MULTIPLE TOOTH EXTRACTIONS  03/03/2002   #1, 16, 17, 30, 32   TOENAIL EXCISION Bilateral    great toe   TOOTH EXTRACTION Right 09/03/2014   Procedure: EXTRACTION MOLAR LOWER RIGHT;  Surgeon: Gae Bon, DDS;  Location: [place];  Service: Oral Surgery;  Laterality: Right;   TOOTH EXTRACTION N/A 03/06/2018   Procedure: EXTRACTION X 1;  Surgeon: Diona Browner, DDS;  Location: [place];  Service: Oral Surgery;  Laterality: N/A;   TOOTH EXTRACTION N/A 12/20/2020   Procedure: DENTAL RESTORATION/EXTRACTIONS TEETH THIRTEEN AND NINETEEN.;  Surgeon: Diona Browner, DMD;  Location: [place];  Service: Oral Surgery;  Laterality: N/A;    There were no vitals filed for this visit.   Subjective Assessment - 05/29/21 1658     Subjective Pt non-verbal and mother sat out in waiting room for session.  No facial  expression or moaning to simulate any pain through session.    Currently in Pain? No/denies                               [facility] Adult PT Treatment/Exercise - 05/29/21 0001       Manual Therapy   Manual Therapy Manual Lymphatic Drainage (MLD);Compression Bandaging    Manual therapy comments Manual complete separate than rest of tx    Manual Lymphatic Drainage (MLD) to include supraclavicular, deep and superfical abdominal, /rt  inguinal/axillary anastomosis Rt LE  only due to time restraints    Compression Bandaging For RT LE only/  using foam and multilayer compression bandages from foot to thigh level.                       PT Short Term Goals - 05/23/21 1517       PT SHORT TERM GOAL #1   Title PT Rt LE measurements at 30/20/and 10 cm to decrease  1cm to reduce risk of cellulitis.    Time 2    Period Weeks    Status Achieved    Target Date 05/08/21  PT SHORT TERM GOAL #2   Title PT mother to state that they are completing the LE exercises at home at least 3x/wk to increase lymphatic circulation    Time 3    Status On-going   mother states she tries but her son gets stubborn.              PT Long Term Goals - 05/25/21 1857       PT LONG TERM GOAL #1   Title PT Rt LE at 30,20 and 10 cm measurements to be decreased  by 2-3 cm to reduce risk of cellulitis.    Baseline 10/20: distal average .7-->1.8 reduction; proximal reduced 2-->5cm    Status Partially Met      PT LONG TERM GOAL #2   Title PT to have a new compresion garment and mother to be able to don    Status Achieved                   Plan - 05/29/21 1702     Clinical Impression Statement Due to 45 minute time spot pt treatment was focused on RT LE only.   Will measure next visit and compare measurement to measurement at last discharge.    Personal Factors and Comorbidities Fitness    Examination-Activity Limitations Dressing;Locomotion Level     Examination-Participation Restrictions Community Activity    Stability/Clinical Decision Making Evolving/Moderate complexity    Rehab Potential Good    PT Frequency 3x / week    PT Duration 4 weeks    PT Treatment/Interventions Manual techniques;Manual lymph drainage;Patient/family education    PT Next Visit Plan measure and check to see if compression garments are the correct size.    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 [place], [place], [address] Phone: [phone]   Fax:  [phone]  Name: Scott Mendez MRN: [id number] Date of Birth: [birth year]

## 2021-05-29 NOTE — Addendum Note (Signed)
Addended by: Bella Kennedy on: 05/29/2021 08:36 AM   Modules accepted: Orders

## 2021-05-31 ENCOUNTER — Encounter (HOSPITAL_COMMUNITY): Payer: Self-pay

## 2021-05-31 ENCOUNTER — Other Ambulatory Visit: Payer: Self-pay

## 2021-05-31 ENCOUNTER — Ambulatory Visit (HOSPITAL_COMMUNITY): Payer: Medicare Other

## 2021-05-31 ENCOUNTER — Other Ambulatory Visit: Payer: Self-pay | Admitting: Family Medicine

## 2021-05-31 ENCOUNTER — Other Ambulatory Visit: Payer: Self-pay | Admitting: Nurse Practitioner

## 2021-05-31 DIAGNOSIS — I10 Essential (primary) hypertension: Secondary | ICD-10-CM

## 2021-05-31 DIAGNOSIS — I890 Lymphedema, not elsewhere classified: Secondary | ICD-10-CM

## 2021-05-31 NOTE — Therapy (Addendum)
[facility] [address] [place], [place], [address] Phone: [phone]   Fax:  [phone]  Physical Therapy Treatment  Patient Details  Name: Scott Mendez MRN: [id number] Date of Birth: 1981/06/12 Referring Provider (PT): Demetrius Revel  PHYSICAL THERAPY DISCHARGE SUMMARY  Visits from Start of Care: 13  Current functional level related to goals / functional outcomes: See below   Remaining deficits: Continued edema    Education / Equipment: PT needs to obtain new compression garment as current garment is to large.    Patient agrees to discharge. Patient goals were partially met. Patient is being discharged due to being pleased with the current functional level.   Encounter Date: 05/31/2021   PT End of Session - 05/31/21 0936     Visit Number 13    Number of Visits 13   Date for PT Re-Evaluation 06/25/21    Authorization Type medicare    Progress Note Due on Visit 18    PT Start Time 0835    PT Stop Time 0935    PT Time Calculation (min) 60 min    Activity Tolerance Patient tolerated treatment well    Behavior During Therapy [facility] for tasks assessed/performed             Past Medical History:  Diagnosis Date   Autism    Hypertension    states under control with meds., has been on med. x 8 yr.   Morbid obesity with BMI of 45.0-49.9, adult ([place])    Nonrestorable tooth 02/2018   x 2   Nonverbal    Sleep apnea    mild - no CPAP    Past Surgical History:  Procedure Laterality Date   CYST EXCISION Right 03/03/2002   mandible   MULTIPLE TOOTH EXTRACTIONS  03/03/2002   #1, 16, 17, 30, 32   TOENAIL EXCISION Bilateral    great toe   TOOTH EXTRACTION Right 09/03/2014   Procedure: EXTRACTION MOLAR LOWER RIGHT;  Surgeon: Gae Bon, DDS;  Location: [facility];  Service: Oral Surgery;  Laterality: Right;   TOOTH EXTRACTION N/A 03/06/2018   Procedure: EXTRACTION X 1;  Surgeon: Diona Browner, DDS;  Location: [place];  Service: Oral Surgery;  Laterality: N/A;   TOOTH EXTRACTION N/A 12/20/2020   Procedure: DENTAL RESTORATION/EXTRACTIONS TEETH THIRTEEN AND NINETEEN.;  Surgeon: Diona Browner, DMD;  Location: [facility];  Service: Oral Surgery;  Laterality: N/A;    There were no vitals filed for this visit.   Subjective Assessment - 05/31/21 0932     Subjective Mother reports compliance with pump every day and son's ability to donn compression socks independently.  Mother reports the current compression socks ride down frequently and wonder if correct size.    Currently in Pain? No/denies                   LYMPHEDEMA/ONCOLOGY QUESTIONNAIRE - 05/31/21 0001       Right Lower Extremity Lymphedema   20 cm Proximal to Suprapatella 77.2 cm   initial eval 9/19 was: 81.5   10 cm Proximal to Suprapatella 67.8 cm   initial eval 9/19 was: 72.4   At Midpatella/Popliteal Crease 50 cm   initial eval 9/19 was: 54   30 cm Proximal to Floor at Lateral Plantar Foot 45.5 cm   initial eval 9/19 was: 48   20 cm Proximal to Floor at Lateral Plantar Foot 43 1   initial eval 9/19 was: 45.5  10 cm Proximal to Floor at Lateral Malleoli 34.2 cm   initial eval 9/19 was: 37   Circumference of ankle/heel 43 cm.   initial eval 9/19 was: 45   5 cm Proximal to 1st MTP Joint 31.1 cm   initial eval 9/19 was: 31.5   Across MTP Joint 29.1 cm   initial eval 9/19 was: 29                       [facility] Adult PT Treatment/Exercise - 05/31/21 0001       Manual Therapy   Manual Therapy Manual Lymphatic Drainage (MLD);Compression Bandaging;Other (comment)    Manual therapy comments Manual complete separate than rest of tx    Manual Lymphatic Drainage (MLD) to include supraclavicular, deep and superfical abdominal, /rt  inguinal/axillary anastomosis Rt LE  anterior and posterior    Compression Bandaging For RT LE only/  using foam and multilayer compression bandages from foot to thigh level.    Other Manual  Therapy Measurements taken as well as measurements complete for compression socks                       PT Short Term Goals - 05/23/21 1517       PT SHORT TERM GOAL #1   Title PT Rt LE measurements at 30/20/and 10 cm to decrease  1cm to reduce risk of cellulitis.    Time 2    Period Weeks    Status Achieved    Target Date 05/08/21      PT SHORT TERM GOAL #2   Title PT mother to state that they are completing the LE exercises at home at least 3x/wk to increase lymphatic circulation    Time 3    Status On-going   mother states she tries but her son gets stubborn.              PT Long Term Goals - 05/25/21 1857       PT LONG TERM GOAL #1   Title PT Rt LE at 30,20 and 10 cm measurements to be decreased  by 2-3 cm to reduce risk of cellulitis.    Baseline 10/20: distal average .7-->1.8 reduction; proximal reduced 2-->5cm    Status Partially Met      PT LONG TERM GOAL #2   Title PT to have a new compresion garment and mother to be able to don    Status Achieved                   Plan - 05/31/21 0937     Clinical Impression Statement Mother present through session, reoprts compression socks slide down.  Measurements taken this session with reduction 2-3 cm proximal.  Reviewed goals, mother reports pt attends center during the day and completes exercises there.  Has been compliant wiht pump daily and ability to donn compression socks independently without assistance required.    Personal Factors and Comorbidities Fitness    Examination-Activity Limitations Dressing;Locomotion Level    Examination-Participation Restrictions Community Activity    Stability/Clinical Decision Making Evolving/Moderate complexity    Clinical Decision Making Moderate    Rehab Potential Good    PT Frequency 3x / week    PT Duration 4 weeks    PT Treatment/Interventions Manual techniques;Manual lymph drainage;Patient/family education    PT Next Visit Plan DC to self care, pump  and compression socks.    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

## 2021-06-02 ENCOUNTER — Encounter (HOSPITAL_COMMUNITY): Payer: Medicare Other

## 2021-06-02 ENCOUNTER — Encounter (HOSPITAL_COMMUNITY): Payer: Self-pay

## 2021-06-05 ENCOUNTER — Encounter (HOSPITAL_COMMUNITY): Payer: Medicare Other | Admitting: Physical Therapy

## 2021-06-05 ENCOUNTER — Encounter (HOSPITAL_COMMUNITY): Payer: Self-pay | Admitting: Physical Therapy

## 2021-06-07 ENCOUNTER — Encounter (HOSPITAL_COMMUNITY): Payer: Medicare Other | Admitting: Physical Therapy

## 2021-06-07 ENCOUNTER — Encounter (HOSPITAL_COMMUNITY): Payer: Self-pay | Admitting: Physical Therapy

## 2021-06-09 ENCOUNTER — Encounter (HOSPITAL_COMMUNITY): Payer: Self-pay

## 2021-06-09 ENCOUNTER — Encounter (HOSPITAL_COMMUNITY): Payer: Medicare Other

## 2021-06-12 ENCOUNTER — Encounter (HOSPITAL_COMMUNITY): Payer: Medicare Other | Admitting: Physical Therapy

## 2021-06-12 ENCOUNTER — Encounter (HOSPITAL_COMMUNITY): Payer: Self-pay | Admitting: Physical Therapy

## 2021-06-14 ENCOUNTER — Encounter (HOSPITAL_COMMUNITY): Payer: Self-pay | Admitting: Physical Therapy

## 2021-06-14 ENCOUNTER — Encounter (HOSPITAL_COMMUNITY): Payer: Medicare Other | Admitting: Physical Therapy

## 2021-06-16 ENCOUNTER — Encounter (HOSPITAL_COMMUNITY): Payer: Medicare Other

## 2021-06-16 ENCOUNTER — Encounter (HOSPITAL_COMMUNITY): Payer: Self-pay

## 2021-06-17 ENCOUNTER — Other Ambulatory Visit: Payer: Self-pay | Admitting: Family Medicine

## 2021-06-21 ENCOUNTER — Encounter (HOSPITAL_COMMUNITY): Payer: Medicare Other | Admitting: Physical Therapy

## 2021-06-27 ENCOUNTER — Encounter: Payer: Medicare Other | Admitting: Family Medicine

## 2021-06-30 ENCOUNTER — Other Ambulatory Visit: Payer: Self-pay | Admitting: Family Medicine

## 2021-06-30 DIAGNOSIS — I10 Essential (primary) hypertension: Secondary | ICD-10-CM

## 2021-07-05 ENCOUNTER — Ambulatory Visit (INDEPENDENT_AMBULATORY_CARE_PROVIDER_SITE_OTHER): Payer: Medicare Other

## 2021-07-05 ENCOUNTER — Other Ambulatory Visit: Payer: Self-pay

## 2021-07-05 DIAGNOSIS — I10 Essential (primary) hypertension: Secondary | ICD-10-CM

## 2021-07-05 DIAGNOSIS — Z0000 Encounter for general adult medical examination without abnormal findings: Secondary | ICD-10-CM | POA: Diagnosis not present

## 2021-07-05 MED ORDER — OLMESARTAN MEDOXOMIL-HCTZ 40-12.5 MG PO TABS: 1.0000 | ORAL_TABLET | Freq: Every day | ORAL | 5 refills | Status: DC

## 2021-07-05 NOTE — Progress Notes (Signed)
I connected with  Scott Mendez on 07/05/21 by a audio enabled telemedicine application and verified that I am speaking with the correct person using two identifiers.  Patient Location: Home  Provider Location: Office/Clinic  I discussed the limitations of evaluation and management by telemedicine. The patient expressed understanding and agreed to proceed.   Subjective:   Scott Mendez is a 40 y.o. male who presents for Medicare Annual/Subsequent preventive examination.  Review of Systems     Cardiac Risk Factors include: male gender;obesity (BMI >30kg/m2);sedentary lifestyle     Objective:    Today's Vitals   07/05/21 1052  PainSc: 0-No pain   There is no height or weight on file to calculate BMI.  Advanced Directives 07/05/2021 04/24/2021 12/20/2020 07/07/2020 06/23/2020 07/07/2018 06/19/2018  Does Patient Have a Medical Advance Directive? No No No No No No No  Type of Advance Directive - - - - - - -  Does patient want to make changes to medical advance directive? - - - - - - -  Copy of Healthcare Power of Attorney in Chart? - - - - - - -  Would patient like information on creating a medical advance directive? Yes (ED - Information included in AVS) No - Patient declined - No - Patient declined No - Patient declined No - Patient declined No - Patient declined    Current Medications (verified) Outpatient Encounter Medications as of 07/05/2021  Medication Sig   acetaminophen (TYLENOL) 500 MG tablet Take 1,000 mg by mouth every 6 (six) hours as needed for moderate pain or headache.   furosemide (LASIX) 20 MG tablet TAKE 1 TABLET BY MOUTH 2 TIMES DAILY.   KLOR-CON M20 20 MEQ tablet TAKE 1 TABLET BY MOUTH TWICE A DAY   loratadine (CLARITIN) 10 MG tablet TAKE ONE TABLET BY MOUTH ONCE DAILY.   montelukast (SINGULAIR) 10 MG tablet TAKE ONE TABLET BY MOUTH AT BEDTIME   olmesartan-hydrochlorothiazide (BENICAR HCT) 40-12.5 MG tablet Take 1 tablet by mouth daily.    selenium sulfide (SELSUN) 1 % LOTN Wash  Hair daily with 12 cc shampoo for 3 weeks , then twice weekly (Patient taking differently: Apply 1 application topically 2 (two) times a week. Wash hair with 12 cc)   traZODone (DESYREL) 100 MG tablet TAKE 1 TABLET BY MOUTH AT BEDTIME.   Vitamin D, Ergocalciferol, (DRISDOL) 1.25 MG (50000 UNIT) CAPS capsule TAKE 1 CAPSULE BY MOUTH ONCE A WEEK.   [DISCONTINUED] olmesartan-hydrochlorothiazide (BENICAR HCT) 40-12.5 MG tablet TAKE ONE TABLET BY MOUTH ONCE DAILY.   No facility-administered encounter medications on file as of 07/05/2021.    Allergies (verified) Patient has no known allergies.   History: Past Medical History:  Diagnosis Date   Autism    Hypertension    states under control with meds., has been on med. x 8 yr.   Morbid obesity with BMI of 45.0-49.9, adult (HCC)    Nonrestorable tooth 02/2018   x 2   Nonverbal    Sleep apnea    mild - no CPAP   Past Surgical History:  Procedure Laterality Date   CYST EXCISION Right 03/03/2002   mandible   MULTIPLE TOOTH EXTRACTIONS  03/03/2002   #1, 16, 17, 30, 32   TOENAIL EXCISION Bilateral    great toe   TOOTH EXTRACTION Right 09/03/2014   Procedure: EXTRACTION MOLAR LOWER RIGHT;  Surgeon: Georgia Lopes, DDS;  Location: [facility] OR;  Service: Oral Surgery;  Laterality: Right;   TOOTH EXTRACTION N/A  03/06/2018   Procedure: EXTRACTION X 1;  Surgeon: Ocie Doyne, DDS;  Location: [facility];  Service: Oral Surgery;  Laterality: N/A;   TOOTH EXTRACTION N/A 12/20/2020   Procedure: DENTAL RESTORATION/EXTRACTIONS TEETH THIRTEEN AND NINETEEN.;  Surgeon: Ocie Doyne, DMD;  Location: [facility] OR;  Service: Oral Surgery;  Laterality: N/A;   Family History  Problem Relation Age of Onset   Hypertension Mother    Hypertension Father    Diabetes Father    Heart disease Maternal Grandfather    Diabetes Paternal Grandmother    Seizures Maternal Uncle    CVA Maternal Grandmother    Heart disease  Paternal Grandfather    Social History   Socioeconomic History   Marital status: Single    Spouse name: N/A   Number of children: 0   Years of education: Not on file   Highest education level: Not on file  Occupational History   Not on file  Tobacco Use   Smoking status: Never   Smokeless tobacco: Never  Vaping Use   Vaping Use: Never used  Substance and Sexual Activity   Alcohol use: No   Drug use: No   Sexual activity: Never  Other Topics Concern   Not on file  Social History Narrative   Lives with Mother and sister    Social Determinants of Health   Financial Resource Strain: Not on file  Food Insecurity: Not on file  Transportation Needs: Not on file  Physical Activity: Not on file  Stress: Not on file  Social Connections: Not on file    Tobacco Counseling Counseling given: Not Answered   Clinical Intake:  Pre-visit preparation completed: Yes  Pain : No/denies pain Pain Score: 0-No pain     Nutritional Status: BMI > 30  Obese  How often do you need to have someone help you when you read instructions, pamphlets, or other written materials from your doctor or pharmacy?: 5 - Always  Diabetic?no  Interpreter Needed?: No      Activities of Daily Living In your present state of health, do you have any difficulty performing the following activities: 07/05/2021 12/20/2020  Hearing? N -  Vision? N -  Difficulty concentrating or making decisions? N -  Walking or climbing stairs? N -  Dressing or bathing? N -  Doing errands, shopping? 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 N -  Using the Toilet? N -  In the past six months, have you accidently leaked urine? N -  Do you have problems with loss of bowel control? N -  Managing your Medications? Y -  Managing your Finances? Y -  Housekeeping or managing your Housekeeping? Y -  Some recent data might be hidden    Patient Care Team: Kerri Perches, MD as PCP - General Mike Craze, MD (Inactive) as  Consulting Physician (Psychiatry)  Indicate any recent Medical Services you may have received from other than [facility] providers in the past year (date may be approximate).     Assessment:   This is a routine wellness examination for Scott Mendez.  Hearing/Vision screen No results found.  Dietary issues and exercise activities discussed: Current Exercise Habits: The patient does not participate in regular exercise at present, Exercise limited by: psychological condition(s)   Goals Addressed             This Visit's Progress    Exercise 3x per week (30 min per time)   On track    Recommend starting  a routine exercise program at least 3 days a week for 30-45 minutes at a time as tolerated.         Depression Screen PHQ 2/9 Scores 03/24/2021 11/08/2020 10/04/2020 06/23/2020 06/23/2020 05/11/2020 12/02/2019  PHQ - 2 Score 0 0 0 0 0 0 0  PHQ- 9 Score - - - - - - 0  Exception Documentation - - - - - - -    Fall Risk Fall Risk  07/05/2021 05/11/2021 03/24/2021 12/06/2020 11/08/2020  Falls in the past year? 0 0 0 0 0  Number falls in past yr: 0 0 0 0 0  Injury with Fall? 0 0 0 0 0  Risk for fall due to : - - No Fall Risks - No Fall Risks  Follow up - - Falls evaluation completed - Falls evaluation completed    FALL RISK PREVENTION PERTAINING TO THE HOME:  Any stairs in or around the home? No  If so, are there any without handrails? No  Home free of loose throw rugs in walkways, pet beds, electrical cords, etc? Yes  Adequate lighting in your home to reduce risk of falls? Yes   ASSISTIVE DEVICES UTILIZED TO PREVENT FALLS:  Life alert? No  Use of a cane, walker or w/c? No  Grab bars in the bathroom? Yes  Shower chair or bench in shower? No  Elevated toilet seat or a handicapped toilet? No    Cognitive Function: MMSE - Mini Mental State Exam 03/13/2017  Not completed: Unable to complete        Immunizations Immunization History  Administered Date(s) Administered   H1N1 06/03/2008    Influenza Split 04/22/2012   Influenza Whole 05/07/2007, 05/02/2009, 04/17/2010   Influenza,inj,Quad PF,6+ Mos 05/18/2013, 07/20/2014, 04/04/2015, 04/04/2016, 04/10/2017, 05/05/2018, 04/20/2019, 04/13/2020, 05/11/2021   Moderna Sars-Covid-2 Vaccination 12/07/2019, 01/06/2020   Td 11/23/2002   Tdap 12/19/2011    TDAP status: Up to date  Flu vaccine complered  Pneumococcal vaccine status: Declined,  Education has been provided regarding the importance of this vaccine but patient still declined. Advised may receive this vaccine at local pharmacy or Health Dept. Aware to provide a copy of the vaccination record if obtained from local pharmacy or Health Dept. Verbalized acceptance and understanding.   Covid-19 vaccine status: Completed vaccines  Qualifies for Shingles Vaccine? No   Zostavax completed No   Shingrix Completed?: No.    Education has been provided regarding the importance of this vaccine. Patient has been advised to call insurance company to determine out of pocket expense if they have not yet received this vaccine. Advised may also receive vaccine at local pharmacy or Health Dept. Verbalized acceptance and understanding.  Screening Tests Health Maintenance  Topic Date Due   COVID-19 Vaccine (4 - Booster for Moderna series) 01/03/2021   TETANUS/TDAP  12/18/2021   INFLUENZA VACCINE  Completed   Hepatitis C Screening  Completed   HIV Screening  Completed   Pneumococcal Vaccine 59-42 Years old  Aged Out   HPV VACCINES  Aged Out    Health Maintenance  Health Maintenance Due  Topic Date Due   COVID-19 Vaccine (4 - Booster for Moderna series) 01/03/2021    Colorectal cancer screening: No longer required.   Lung Cancer Screening: (Low Dose CT Chest recommended if Age 81-80 years, 30 pack-year currently smoking OR have quit w/in 15years.) does not qualify.   Lung Cancer Screening Referral: na  Additional Screening:  Hepatitis C Screening: does qualify; Completed  yes  Vision Screening:  Recommended annual ophthalmology exams for early detection of glaucoma and other disorders of the eye. once Is the patient up to date with their annual eye exam?  Yes  Who is the provider or what is the name of the office in which the patient attends annual eye exams? Dr Nile Riggs If pt is not established with a provider, would they like to be referred to a provider to establish care? No .   Dental Screening: Recommended annual dental exams for proper oral hygiene  Community Resource Referral / Chronic Care Management: CRR required this visit?  No   CCM required this visit?  No      Plan:     I have personally reviewed and noted the following in the patient's chart:   Medical and social history Use of alcohol, tobacco or illicit drugs  Current medications and supplements including opioid prescriptions. Patient is currently taking opioid prescriptions. Information provided to patient regarding non-opioid alternatives. Patient advised to discuss non-opioid treatment plan with their provider. Functional ability and status Nutritional status Physical activity Advanced directives List of other physicians Hospitalizations, surgeries, and ER visits in previous 12 months Vitals Screenings to include cognitive, depression, and falls Referrals and appointments  In addition, I have reviewed and discussed with patient certain preventive protocols, quality metrics, and best practice recommendations. A written personalized care plan for preventive services as well as general preventive health recommendations were provided to patient.     Everitt Amber, LPN, LPN   [id number]   Nurse Notes:  Mr. Sheller , Thank you for taking time to come for your Medicare Wellness Visit. I appreciate your ongoing commitment to your health goals. Please review the following plan we discussed and let me know if I can assist you in the future.   These are the goals we discussed:   Goals      Exercise 3x per week (30 min per time)     Recommend starting a routine exercise program at least 3 days a week for 30-45 minutes at a time as tolerated.       Weight (lb) < 200 lb (90.7 kg)        This is a list of the screening recommended for you and due dates:  Health Maintenance  Topic Date Due   COVID-19 Vaccine (4 - Booster for Moderna series) 01/03/2021   Tetanus Vaccine  12/18/2021   Flu Shot  Completed   Hepatitis C Screening: USPSTF Recommendation to screen - Ages 18-79 yo.  Completed   HIV Screening  Completed   Pneumococcal Vaccination  Aged Out   HPV Vaccine  Aged Out

## 2021-07-05 NOTE — Patient Instructions (Addendum)
Scott Mendez , Thank you for taking time to come for your Medicare Wellness Visit. I appreciate your ongoing commitment to your health goals. Please review the following plan we discussed and let me know if I can assist you in the future.   These are the goals we discussed:  Goals      Exercise 3x per week (30 min per time)     Recommend starting a routine exercise program at least 3 days a week for 30-45 minutes at a time as tolerated.       Weight (lb) < 200 lb (90.7 kg)        This is a list of the screening recommended for you and due dates:  Health Maintenance  Topic Date Due   COVID-19 Vaccine (4 - Booster for Moderna series) 01/03/2021   Tetanus Vaccine  12/18/2021   Flu Shot  Completed   Hepatitis C Screening: USPSTF Recommendation to screen - Ages 18-79 yo.  Completed   HIV Screening  Completed   Pneumococcal Vaccination  Aged Out   HPV Vaccine  Aged Out      Health Maintenance, Male Adopting a healthy lifestyle and getting preventive care are important in promoting health and wellness. Ask your health care provider about: The right schedule for you to have regular tests and exams. Things you can do on your own to prevent diseases and keep yourself healthy. What should I know about diet, weight, and exercise? Eat a healthy diet  Eat a diet that includes plenty of vegetables, fruits, low-fat dairy products, and lean protein. Do not eat a lot of foods that are high in solid fats, added sugars, or sodium. Maintain a healthy weight Body mass index (BMI) is a measurement that can be used to identify possible weight problems. It estimates body fat based on height and weight. Your health care provider can help determine your BMI and help you achieve or maintain a healthy weight. Get regular exercise Get regular exercise. This is one of the most important things you can do for your health. Most adults should: Exercise for at least 150 minutes each week. The exercise  should increase your heart rate and make you sweat (moderate-intensity exercise). Do strengthening exercises at least twice a week. This is in addition to the moderate-intensity exercise. Spend less time sitting. Even light physical activity can be beneficial. Watch cholesterol and blood lipids Have your blood tested for lipids and cholesterol at 39 years of age, then have this test every 5 years. You may need to have your cholesterol levels checked more often if: Your lipid or cholesterol levels are high. You are older than 38 years of age. You are at high risk for heart disease. What should I know about cancer screening? Many types of cancers can be detected early and may often be prevented. Depending on your health history and family history, you may need to have cancer screening at various ages. This may include screening for: Colorectal cancer. Prostate cancer. Skin cancer. Lung cancer. What should I know about heart disease, diabetes, and high blood pressure? Blood pressure and heart disease High blood pressure causes heart disease and increases the risk of stroke. This is more likely to develop in people who have high blood pressure readings or are overweight. Talk with your health care provider about your target blood pressure readings. Have your blood pressure checked: Every 3-5 years if you are 64-107 years of age. Every year if you are 58 years old  or older. If you are between the ages of 67 and 23 and are a current or former smoker, ask your health care provider if you should have a one-time screening for abdominal aortic aneurysm (AAA). Diabetes Have regular diabetes screenings. This checks your fasting blood sugar level. Have the screening done: Once every three years after age 13 if you are at a normal weight and have a low risk for diabetes. More often and at a younger age if you are overweight or have a high risk for diabetes. What should I know about preventing  infection? Hepatitis B If you have a higher risk for hepatitis B, you should be screened for this virus. Talk with your health care provider to find out if you are at risk for hepatitis B infection. Hepatitis C Blood testing is recommended for: Everyone born from [birth year] through 1963. Anyone with known risk factors for hepatitis C. Sexually transmitted infections (STIs) You should be screened each year for STIs, including gonorrhea and chlamydia, if: You are sexually active and are younger than 38 years of age. You are older than 38 years of age and your health care provider tells you that you are at risk for this type of infection. Your sexual activity has changed since you were last screened, and you are at increased risk for chlamydia or gonorrhea. Ask your health care provider if you are at risk. Ask your health care provider about whether you are at high risk for HIV. Your health care provider may recommend a prescription medicine to help prevent HIV infection. If you choose to take medicine to prevent HIV, you should first get tested for HIV. You should then be tested every 3 months for as long as you are taking the medicine. Follow these instructions at home: Alcohol use Do not drink alcohol if your health care provider tells you not to drink. If you drink alcohol: Limit how much you have to 0-2 drinks a day. Know how much alcohol is in your drink. In the [place] one drink equals one 12 oz bottle of beer (355 mL), one 5 oz glass of wine (148 mL), or one 1 oz glass of hard liquor (44 mL). Lifestyle Do not use any products that contain nicotine or tobacco. These products include cigarettes, chewing tobacco, and vaping devices, such as e-cigarettes. If you need help quitting, ask your health care provider. Do not use street drugs. Do not share needles. Ask your health care provider for help if you need support or information about quitting drugs. General instructions Schedule regular health,  dental, and eye exams. Stay current with your vaccines. Tell your health care provider if: You often feel depressed. You have ever been abused or do not feel safe at home. Summary Adopting a healthy lifestyle and getting preventive care are important in promoting health and wellness. Follow your health care provider's instructions about healthy diet, exercising, and getting tested or screened for diseases. Follow your health care provider's instructions on monitoring your cholesterol and blood pressure. This information is not intended to replace advice given to you by your health care provider. Make sure you discuss any questions you have with your health care provider. Document Revised: 12/12/2020 Document Reviewed: 12/12/2020 Elsevier Patient Education  2022 [employer].

## 2021-07-11 ENCOUNTER — Other Ambulatory Visit: Payer: Self-pay | Admitting: Family Medicine

## 2021-07-11 DIAGNOSIS — E559 Vitamin D deficiency, unspecified: Secondary | ICD-10-CM

## 2021-07-18 ENCOUNTER — Other Ambulatory Visit: Payer: Self-pay | Admitting: Family Medicine

## 2021-08-03 ENCOUNTER — Other Ambulatory Visit: Payer: Self-pay | Admitting: Family Medicine

## 2021-09-06 ENCOUNTER — Other Ambulatory Visit: Payer: Self-pay | Admitting: Family Medicine

## 2021-09-18 ENCOUNTER — Other Ambulatory Visit: Payer: Self-pay | Admitting: Family Medicine

## 2021-10-04 ENCOUNTER — Other Ambulatory Visit: Payer: Self-pay | Admitting: Nurse Practitioner

## 2021-10-04 DIAGNOSIS — E559 Vitamin D deficiency, unspecified: Secondary | ICD-10-CM

## 2021-10-06 ENCOUNTER — Telehealth: Payer: Self-pay | Admitting: Family Medicine

## 2021-10-06 NOTE — Telephone Encounter (Signed)
Patient has changed pharmacies.  ?Patient would like all refills and new prescriptions to go to  [id number] [place], [place] - [address] ? ?Patient needs refills on  ? ?ALLERGY RELIEF 10 MG tablet  ? ?furosemide (LASIX) 20 MG tablet ? ?KLOR-CON M20 20 MEQ tablet  ? ?montelukast (SINGULAIR) 10 MG tablet ? ?traZODone (DESYREL) 100 MG tablet  ? ?Vitamin D, Ergocalciferol, (DRISDOL) 1.25 MG (50000 UNIT) CAPS capsule  ? ?olmesartan-hydrochlorothiazide (BENICAR HCT) 40-12.5 MG tablet ?

## 2021-10-09 ENCOUNTER — Other Ambulatory Visit: Payer: Self-pay

## 2021-10-09 DIAGNOSIS — I10 Essential (primary) hypertension: Secondary | ICD-10-CM

## 2021-10-09 DIAGNOSIS — E559 Vitamin D deficiency, unspecified: Secondary | ICD-10-CM

## 2021-10-09 MED ORDER — OLMESARTAN MEDOXOMIL-HCTZ 40-12.5 MG PO TABS: 1.0000 | ORAL_TABLET | Freq: Every day | ORAL | 5 refills | Status: DC

## 2021-10-09 MED ORDER — VITAMIN D (ERGOCALCIFEROL) 1.25 MG (50000 UNIT) PO CAPS: 50000.0000 [IU] | ORAL_CAPSULE | ORAL | 1 refills | Status: DC

## 2021-10-09 MED ORDER — LORATADINE 10 MG PO TABS: 10.0000 mg | ORAL_TABLET | Freq: Every day | ORAL | 1 refills | Status: DC

## 2021-10-09 MED ORDER — MONTELUKAST SODIUM 10 MG PO TABS: 10.0000 mg | ORAL_TABLET | Freq: Every day | ORAL | 0 refills | Status: DC

## 2021-10-09 MED ORDER — TRAZODONE HCL 100 MG PO TABS: 100.0000 mg | ORAL_TABLET | Freq: Every day | ORAL | 1 refills | Status: DC

## 2021-10-09 MED ORDER — POTASSIUM CHLORIDE CRYS ER 20 MEQ PO TBCR: 20.0000 meq | EXTENDED_RELEASE_TABLET | Freq: Two times a day (BID) | ORAL | 1 refills | Status: DC

## 2021-10-09 MED ORDER — FUROSEMIDE 20 MG PO TABS: 20.0000 mg | ORAL_TABLET | Freq: Two times a day (BID) | ORAL | 0 refills | Status: DC

## 2021-10-09 NOTE — Telephone Encounter (Signed)
Refill sent.

## 2021-10-13 ENCOUNTER — Other Ambulatory Visit: Payer: Self-pay

## 2021-10-13 ENCOUNTER — Encounter: Payer: Self-pay | Admitting: Family Medicine

## 2021-10-13 ENCOUNTER — Ambulatory Visit (INDEPENDENT_AMBULATORY_CARE_PROVIDER_SITE_OTHER): Payer: Medicare Other | Admitting: Family Medicine

## 2021-10-13 VITALS — BP 128/82 | HR 106 | Temp 97.6°F | Resp 16 | Ht 71.0 in | Wt 376.0 lb

## 2021-10-13 DIAGNOSIS — L600 Ingrowing nail: Secondary | ICD-10-CM

## 2021-10-13 DIAGNOSIS — I10 Essential (primary) hypertension: Secondary | ICD-10-CM

## 2021-10-13 DIAGNOSIS — J302 Other seasonal allergic rhinitis: Secondary | ICD-10-CM

## 2021-10-13 DIAGNOSIS — R7303 Prediabetes: Secondary | ICD-10-CM | POA: Diagnosis not present

## 2021-10-13 MED ORDER — PREDNISONE 5 MG PO TABS: 5.0000 mg | ORAL_TABLET | Freq: Two times a day (BID) | ORAL | 0 refills | Status: AC

## 2021-10-13 NOTE — Patient Instructions (Addendum)
Annual exam in office with MD in 7 weeks, call if you need me before ? ?You are  referred to podiatry  for ingrown toenail ? ? ? ?Prednisone prescribed for cough ? ?Fasting lipid, cmp and EGFR, HBA1c and Vit D in next 1 week ? ?Thanks for choosing [facility], we consider it a privelige to serve you. ? ?

## 2021-10-15 ENCOUNTER — Encounter: Payer: Self-pay | Admitting: Family Medicine

## 2021-10-15 DIAGNOSIS — L600 Ingrowing nail: Secondary | ICD-10-CM | POA: Insufficient documentation

## 2021-10-15 NOTE — Assessment & Plan Note (Signed)
Patient educated about the importance of limiting  Carbohydrate intake , the need to commit to daily physical activity for a minimum of 30 minutes , and to commit weight loss. ?The fact that changes in all these areas will reduce or eliminate all together the development of diabetes is stressed.  ? ?Diabetic Labs Latest Ref Rng & Units 05/11/2021 11/07/2020 11/04/2019 12/31/2018 07/07/2018  ?HbA1c 4.8 - 5.6 % 5.9(H) - 5.3 - -  ?Chol 100 - 199 mg/dL - 169 450 388 -  ?HDL >39 mg/dL - 82(C) 40 00(L) -  ?Calc LDL 0 - 99 mg/dL - 82 92 87 -  ?Triglycerides 0 - 149 mg/dL - 491 91 79 -  ?Creatinine 0.76 - 1.27 mg/dL 7.91 5.05 6.97 9.48 0.16  ? ?BP/Weight 10/13/2021 05/11/2021 03/24/2021 12/20/2020 12/06/2020 11/08/2020 10/04/2020  ?Systolic BP 128 122 132 134 134 148 138  ?Diastolic BP 82 80 85 89 82 87 90  ?Wt. (Lbs) 376 373 368 351 370 370 374  ?BMI 52.44 52.02 51.33 48.95 51.6 51.6 52.16  ?Some encounter information is confidential and restricted. Go to Review Flowsheets activity to see all data.  ? ?No flowsheet data found. ? ?Updated lab needed at/ before next visit. ? ?

## 2021-10-15 NOTE — Assessment & Plan Note (Signed)
Refer Podiatry for management ?

## 2021-10-15 NOTE — Assessment & Plan Note (Signed)
?  Patient re-educated about  the importance of commitment to a  minimum of 150 minutes of exercise per week as able. ? ?The importance of healthy food choices with portion control discussed, as well as eating regularly and within a 12 hour window most days. ?The need to choose "clean , green" food 50 to 75% of the time is discussed, as well as to make water the primary drink and set a goal of 64 ounces water daily. ? ?  ?Weight /BMI 10/13/2021 05/11/2021 03/24/2021  ?WEIGHT 376 lb 373 lb 368 lb  ?HEIGHT 5\' 11"  5\' 11"  5\' 11"   ?BMI 52.44 kg/m2 52.02 kg/m2 51.33 kg/m2  ?Some encounter information is confidential and restricted. Go to Review Flowsheets activity to see all data.  ? ? ? ?

## 2021-10-15 NOTE — Assessment & Plan Note (Signed)
Uncontrolled, short course of prednisone added ?

## 2021-10-15 NOTE — Assessment & Plan Note (Signed)
Controlled, no change in medication ?DASH diet and commitment to daily physical activity for a minimum of 30 minutes discussed and encouraged, as a part of hypertension management. ?The importance of attaining a healthy weight is also discussed. ? ?BP/Weight 10/13/2021 05/11/2021 03/24/2021 12/20/2020 12/06/2020 11/08/2020 10/04/2020  ?Systolic BP 128 122 132 134 134 148 138  ?Diastolic BP 82 80 85 89 82 87 90  ?Wt. (Lbs) 376 373 368 351 370 370 374  ?BMI 52.44 52.02 51.33 48.95 51.6 51.6 52.16  ?Some encounter information is confidential and restricted. Go to Review Flowsheets activity to see all data.  ? ? ? ? ?

## 2021-10-15 NOTE — Progress Notes (Signed)
? Scott Mendez     MRN: [id number]      DOB: 07/02/1981 ? ? ?HPI ?Mother is historian, pt autistic ?[name] is here for follow up and re-evaluation of chronic medical conditions, medication management and review of any available recent lab and radiology data.  ?Preventive health is updated, specifically  Cancer screening and Immunization.   ?C/o painful great toe from ingrown nail ?C/o dry cough and runny nose x 2 weeks, no fevr or chills ?Normal activity and appetite ? ?ROS ?Denies recent fever or chills. ?Denies  leg swelling ?Denies abdominal pain, nausea, vomiting,diarrhea or constipation.   ?Denies dysuria, frequency, hesitancy or incontinence. ?Denies joint pain, swelling has limitation in mobility.because of size ?Denies , seizures, . ?Denies uncontrolled  anxiety or insomnia. ?Denies skin break down or rash. ? ? ?PE ? ?BP 128/82   Pulse (!) 106   Temp 97.6 ?F (36.4 ?C) (Temporal)   Resp 16   Ht 5\' 11"  (1.803 m)   Wt (!) 376 lb (170.6 kg)   SpO2 93%   BMI 52.44 kg/m?  ? ?Patient  in no cardiopulmonary distress. ? ?HEENT: No facial asymmetry, EOMI,     Neck supple . ? ?Chest: Clear to auscultation bilaterally. ? ?CVS: S1, S2 no murmurs, no S3.Regular rate. ? ?ABD: Soft non tender.  ? ?Ext: No edema ? ?MS: Adequate ROM spine, shoulders, hips and knees. ? ?Skin: Intact, no ulcerations or rash noted.toe not examined ? ?Psych: Good eye contact, normal affect. Memory intact not anxious or depressed appearing. ? ?CNS: CN 2-12 intact, power,  normal throughout.no focal deficits noted. ? ? ?Assessment & Plan ? ?Essential hypertension ?Controlled, no change in medication ?DASH diet and commitment to daily physical activity for a minimum of 30 minutes discussed and encouraged, as a part of hypertension management. ?The importance of attaining a healthy weight is also discussed. ? ?BP/Weight 10/13/2021 05/11/2021 03/24/2021 12/20/2020 12/06/2020 11/08/2020 10/04/2020  ?Systolic BP 0000000 123XX123 Q000111Q Q000111Q 134 148 138   ?Diastolic BP 82 80 85 89 82 87 90  ?Wt. (Lbs) 376 373 368 351 370 370 374  ?BMI 52.44 52.02 51.33 48.95 51.6 51.6 52.16  ?Some encounter information is confidential and restricted. Go to Review Flowsheets activity to see all data.  ? ? ? ? ? ?Morbid obesity ? ?Patient re-educated about  the importance of commitment to a  minimum of 150 minutes of exercise per week as able. ? ?The importance of healthy food choices with portion control discussed, as well as eating regularly and within a 12 hour window most days. ?The need to choose "clean , green" food 50 to 75% of the time is discussed, as well as to make water the primary drink and set a goal of 64 ounces water daily. ? ?  ?Weight /BMI 10/13/2021 05/11/2021 03/24/2021  ?WEIGHT 376 lb 373 lb 368 lb  ?HEIGHT 5\' 11"  5\' 11"  5\' 11"   ?BMI 52.44 kg/m2 52.02 kg/m2 51.33 kg/m2  ?Some encounter information is confidential and restricted. Go to Review Flowsheets activity to see all data.  ? ? ? ? ?Prediabetes ?Patient educated about the importance of limiting  Carbohydrate intake , the need to commit to daily physical activity for a minimum of 30 minutes , and to commit weight loss. ?The fact that changes in all these areas will reduce or eliminate all together the development of diabetes is stressed.  ? ?Diabetic Labs Latest Ref Rng & Units 05/11/2021 11/07/2020 11/04/2019 12/31/2018 07/07/2018  ?HbA1c 4.8 -  5.6 % 5.9(H) - 5.3 - -  ?Chol 100 - 199 mg/dL - 142 151 143 -  ?HDL >39 mg/dL - 37(L) 40 39(L) -  ?Calc LDL 0 - 99 mg/dL - 82 92 87 -  ?Triglycerides 0 - 149 mg/dL - 127 91 79 -  ?Creatinine 0.76 - 1.27 mg/dL 0.93 1.10 1.01 1.01 0.86  ? ?BP/Weight 10/13/2021 05/11/2021 03/24/2021 12/20/2020 12/06/2020 11/08/2020 10/04/2020  ?Systolic BP 0000000 123XX123 Q000111Q Q000111Q 134 148 138  ?Diastolic BP 82 80 85 89 82 87 90  ?Wt. (Lbs) 376 373 368 351 370 370 374  ?BMI 52.44 52.02 51.33 48.95 51.6 51.6 52.16  ?Some encounter information is confidential and restricted. Go to Review Flowsheets activity to see all  data.  ? ?No flowsheet data found. ? ?Updated lab needed at/ before next visit. ? ? ?Seasonal allergies ?Uncontrolled, short course of prednisone added ? ?Ingrown toenail without infection ?Refer Podiatry for management ? ? ?

## 2021-10-19 ENCOUNTER — Other Ambulatory Visit: Payer: Self-pay

## 2021-10-19 DIAGNOSIS — I10 Essential (primary) hypertension: Secondary | ICD-10-CM

## 2021-10-19 DIAGNOSIS — Z13220 Encounter for screening for lipoid disorders: Secondary | ICD-10-CM | POA: Diagnosis not present

## 2021-10-19 DIAGNOSIS — E559 Vitamin D deficiency, unspecified: Secondary | ICD-10-CM | POA: Diagnosis not present

## 2021-10-19 DIAGNOSIS — R7303 Prediabetes: Secondary | ICD-10-CM | POA: Diagnosis not present

## 2021-10-20 LAB — CMP14+EGFR
ALT: 24 IU/L (ref 0–44)
AST: 23 IU/L (ref 0–40)
Albumin/Globulin Ratio: 2 (ref 1.2–2.2)
Albumin: 4.4 g/dL (ref 4.0–5.0)
Alkaline Phosphatase: 100 IU/L (ref 44–121)
BUN/Creatinine Ratio: 15 (ref 9–20)
BUN: 15 mg/dL (ref 6–20)
Bilirubin Total: 0.4 mg/dL (ref 0.0–1.2)
CO2: 28 mmol/L (ref 20–29)
Calcium: 9.1 mg/dL (ref 8.7–10.2)
Chloride: 101 mmol/L (ref 96–106)
Creatinine, Ser: 1.02 mg/dL (ref 0.76–1.27)
Globulin, Total: 2.2 g/dL (ref 1.5–4.5)
Glucose: 95 mg/dL (ref 70–99)
Potassium: 4.1 mmol/L (ref 3.5–5.2)
Sodium: 141 mmol/L (ref 134–144)
Total Protein: 6.6 g/dL (ref 6.0–8.5)
eGFR: 96 mL/min/{1.73_m2} (ref >59)

## 2021-10-20 LAB — LIPID PANEL
Chol/HDL Ratio: 3.6 ratio (ref 0.0–5.0)
Cholesterol, Total: 147 mg/dL (ref 100–199)
HDL: 41 mg/dL (ref >39)
LDL Chol Calc (NIH): 83 mg/dL (ref 0–99)
Triglycerides: 127 mg/dL (ref 0–149)
VLDL Cholesterol Cal: 23 mg/dL (ref 5–40)

## 2021-10-20 LAB — VITAMIN D 25 HYDROXY (VIT D DEFICIENCY, FRACTURES): Vit D, 25-Hydroxy: 37.2 ng/mL (ref 30.0–100.0)

## 2021-10-20 LAB — HEMOGLOBIN A1C
Est. average glucose Bld gHb Est-mCnc: 123 mg/dL
Hgb A1c MFr Bld: 5.9 % — ABNORMAL HIGH (ref 4.8–5.6)

## 2021-10-26 ENCOUNTER — Other Ambulatory Visit: Payer: Self-pay

## 2021-10-26 ENCOUNTER — Ambulatory Visit (INDEPENDENT_AMBULATORY_CARE_PROVIDER_SITE_OTHER): Payer: Medicare Other | Admitting: Podiatry

## 2021-10-26 DIAGNOSIS — L603 Nail dystrophy: Secondary | ICD-10-CM | POA: Diagnosis not present

## 2021-10-30 ENCOUNTER — Encounter: Payer: Self-pay | Admitting: Podiatry

## 2021-10-30 NOTE — Progress Notes (Signed)
?  Subjective:  ?Patient ID: Scott Mendez, male    DOB: 04-20-1981,  MRN: [id number] ? ?Chief Complaint  ?Patient presents with  ? Ingrown Toenail  ?   NP  Ingrown toenail R  ? ? ?41 y.o. male presents with the above complaint. History confirmed with patient.  He presents here today with his mother.  He has autism and is largely nonverbal.  They noticed the fourth toenail was bleeding the other day, she went to make sure it was an ingrown or an issue ? ?Objective:  ?Physical Exam: ?warm, good capillary refill, no trophic changes or ulcerative lesions, normal DP and PT pulses, normal sensory exam, and left fourth toenail is dystrophic and has some dried blood. ? ?Assessment:  ? ?1. Nail dystrophy   ? ? ? ?Plan:  ?Patient was evaluated and treated and all questions answered. ? ?Discussed with his mother that the toenail is dystrophic and thickened probably caught on his sock or shoe gear.  Does not have any active bleeding currently.  I debrided the nail as a courtesy today.  I will see him back as needed. ? ?Return if symptoms worsen or fail to improve.  ? ?

## 2021-10-31 ENCOUNTER — Other Ambulatory Visit: Payer: Self-pay | Admitting: Family Medicine

## 2021-11-23 ENCOUNTER — Other Ambulatory Visit: Payer: Self-pay | Admitting: Family Medicine

## 2021-12-07 ENCOUNTER — Encounter: Payer: Self-pay | Admitting: Family Medicine

## 2021-12-07 ENCOUNTER — Other Ambulatory Visit: Payer: Self-pay | Admitting: Family Medicine

## 2021-12-07 ENCOUNTER — Ambulatory Visit (INDEPENDENT_AMBULATORY_CARE_PROVIDER_SITE_OTHER): Payer: Medicare Other | Admitting: Family Medicine

## 2021-12-07 VITALS — BP 136/82 | HR 101 | Resp 16 | Ht 71.0 in | Wt 373.0 lb

## 2021-12-07 DIAGNOSIS — Z0001 Encounter for general adult medical examination with abnormal findings: Secondary | ICD-10-CM | POA: Diagnosis not present

## 2021-12-07 DIAGNOSIS — J302 Other seasonal allergic rhinitis: Secondary | ICD-10-CM | POA: Diagnosis not present

## 2021-12-07 MED ORDER — DEXTROMETHORPHAN HBR 15 MG/5ML PO SYRP: ORAL_SOLUTION | ORAL | 0 refills | Status: DC

## 2021-12-07 MED ORDER — SEMAGLUTIDE-WEIGHT MANAGEMENT 0.25 MG/0.5ML ~~LOC~~ SOAJ: 0.2500 mg | SUBCUTANEOUS | 0 refills | Status: DC

## 2021-12-07 NOTE — Progress Notes (Signed)
? ?  Scott Mendez     MRN: [id number]      DOB: February 28, 1981 ? ? ?HPI: ?Patient is in for annual physical exam. ?C/o uncontrolled allergies x 1 month and wants help with weight loss. ? ? ? ?PE; ?BP 136/82   Pulse (!) 101   Resp 16   Ht 5\' 11"  (1.803 m)   Wt (!) 373 lb (169.2 kg)   SpO2 93%   BMI 52.02 kg/m?  ? ?Pleasant male,autistic , in no cardio-pulmonary distress. ?Afebrile. ?HEENT ?No facial trauma or asymetry. Sinuses non tender. ?EOMI ?External ears normal,  ?Neck: supple, no adenopathy,JVD or thyromegaly.No bruits. ? ?Chest: ?Clear to ascultation bilaterally.No crackles or wheezes. ?Non tender to palpation ? ?Cardiovascular system; ?Heart sounds normal,  S1 and  S2 ,no S3.  No murmur, or thrill. ?Apical beat not displaced ?Peripheral pulses normal. ? ?Abdomen: ?Soft, non tender, no organomegaly or masses. ?No bruits. ?Bowel sounds normal. ?No guarding, tenderness or rebound. ? ? ? ?Musculoskeletal exam: ?Full ROM of spine, hips , shoulders and knees. ?No deformity ,swelling or crepitus noted. ?No muscle wasting or atrophy.  ? ?Neurologic: ?No focal deficits noted ?No disturbance in gait. No tremor. ? ?Skin: ?Intact, no ulceration, erythema , scaling or rash noted. ?Pigmentation normal throughout ? ? ? ? ?Assessment & Plan:  ?Encounter for Medicare annual examination with abnormal findings ?Annual exam as documented. ?Counseling done  re healthy lifestyle involving commitment to 150 minutes exercise per week, heart healthy diet, and attaining healthy weight.The importance of adequate sleep also discussed. ?Regular seat belt use and home safety, is also discussed. ?Changes in health habits are decided on by the patient with goals and time frames  set for achieving them. ?Immunization and cancer screening needs are specifically addressed at this visit. ? ? ?Seasonal allergies ?Uncontrolled withcough, start dextromethorphan as needed ? ?Morbid obesity ? ?Patient re-educated about  the importance of  commitment to a  minimum of 150 minutes of exercise per week as able. ? ?The importance of healthy food choices with portion control discussed, as well as eating regularly and within a 12 hour window most days. ?The need to choose "clean , green" food 50 to 75% of the time is discussed, as well as to make water the primary drink and set a goal of 64 ounces water daily. ? ?  ? ?  12/07/2021  ?  3:16 PM 10/13/2021  ?  3:29 PM 05/11/2021  ?  2:38 PM  ?Weight /BMI  ?Weight 373 lb 376 lb 373 lb  ?Height 5\' 11"  (1.803 m) 5\' 11"  (1.803 m) 5\' 11"  (1.803 m)  ?BMI 52.02 kg/m2 52.44 kg/m2 52.02 kg/m2  ? ? ?semaglutide prescribed ? ?

## 2021-12-07 NOTE — Assessment & Plan Note (Signed)

## 2021-12-07 NOTE — Assessment & Plan Note (Signed)
Uncontrolled withcough, start dextromethorphan as needed ?

## 2021-12-07 NOTE — Assessment & Plan Note (Signed)
?  Patient re-educated about  the importance of commitment to a  minimum of 150 minutes of exercise per week as able. ? ?The importance of healthy food choices with portion control discussed, as well as eating regularly and within a 12 hour window most days. ?The need to choose "clean , green" food 50 to 75% of the time is discussed, as well as to make water the primary drink and set a goal of 64 ounces water daily. ? ?  ? ?  12/07/2021  ?  3:16 PM 10/13/2021  ?  3:29 PM 05/11/2021  ?  2:38 PM  ?Weight /BMI  ?Weight 373 lb 376 lb 373 lb  ?Height 5\' 11"  (1.803 m) 5\' 11"  (1.803 m) 5\' 11"  (1.803 m)  ?BMI 52.02 kg/m2 52.44 kg/m2 52.02 kg/m2  ? ? ?semaglutide prescribed ?

## 2021-12-07 NOTE — Patient Instructions (Signed)
F/U in  ?End August, re eval weight ? ?PLEASE stop sweets, cakes, coo`kies, change to fruit ? ?Cough syrup at bedtime as needed  prescribed ? ?New  weekly injection to help weight wnd blood sugarm, pls let us know if you cannot get it ? ?Marland KitchenThanks [facility] choosing [facility], [facility] consider [facility] a privelige to serve you. ? ? ? ?

## 2021-12-21 ENCOUNTER — Other Ambulatory Visit: Payer: Self-pay | Admitting: Family Medicine

## 2022-01-04 ENCOUNTER — Other Ambulatory Visit: Payer: Self-pay | Admitting: Family Medicine

## 2022-01-13 ENCOUNTER — Other Ambulatory Visit: Payer: Self-pay | Admitting: Family Medicine

## 2022-01-24 ENCOUNTER — Telehealth: Payer: Self-pay

## 2022-01-24 NOTE — Telephone Encounter (Signed)
Patient mother dropped off Medication order request forms When forms are complete call mother Burna Mortimer at  [phone] and fax to [phone] attn: Josefa Half  Copied Noted sleeved

## 2022-02-06 ENCOUNTER — Other Ambulatory Visit: Payer: Self-pay | Admitting: Family Medicine

## 2022-02-12 ENCOUNTER — Telehealth: Payer: Self-pay | Admitting: Family Medicine

## 2022-03-03 ENCOUNTER — Other Ambulatory Visit: Payer: Self-pay | Admitting: Family Medicine

## 2022-03-13 ENCOUNTER — Other Ambulatory Visit: Payer: Self-pay | Admitting: Family Medicine

## 2022-03-13 DIAGNOSIS — E559 Vitamin D deficiency, unspecified: Secondary | ICD-10-CM

## 2022-03-31 ENCOUNTER — Other Ambulatory Visit: Payer: Self-pay | Admitting: Family Medicine

## 2022-04-01 ENCOUNTER — Other Ambulatory Visit: Payer: Self-pay | Admitting: Family Medicine

## 2022-04-05 ENCOUNTER — Encounter: Payer: Self-pay | Admitting: Family Medicine

## 2022-04-05 ENCOUNTER — Ambulatory Visit (INDEPENDENT_AMBULATORY_CARE_PROVIDER_SITE_OTHER): Payer: Medicare Other | Admitting: Family Medicine

## 2022-04-05 VITALS — BP 132/83 | HR 87 | Resp 16 | Ht 71.0 in | Wt 385.0 lb

## 2022-04-05 DIAGNOSIS — Z23 Encounter for immunization: Secondary | ICD-10-CM

## 2022-04-05 DIAGNOSIS — R7303 Prediabetes: Secondary | ICD-10-CM

## 2022-04-05 DIAGNOSIS — J302 Other seasonal allergic rhinitis: Secondary | ICD-10-CM | POA: Diagnosis not present

## 2022-04-05 DIAGNOSIS — I10 Essential (primary) hypertension: Secondary | ICD-10-CM | POA: Diagnosis not present

## 2022-04-05 DIAGNOSIS — F5104 Psychophysiologic insomnia: Secondary | ICD-10-CM

## 2022-04-05 NOTE — Assessment & Plan Note (Signed)
Controlled, no change in medication  

## 2022-04-05 NOTE — Progress Notes (Signed)
Scott Mendez     MRN: [id number]      DOB: [birth year]   HPI Scott Mendez is here for follow up and re-evaluation of chronic medical conditions, medication management and review of any available recent lab and radiology data.  Preventive health is updated, specifically  Cancer screening and Immunization.   Questions or concerns regarding consultations or procedures which the PT has had in the interim are  addressed. The PT denies any adverse reactions to current medications since the last visit.  C/o weight gain from snacking on unhealthy food over the Summer holidays  ROS History from mother, Scott Mendez is autistic Denies recent fever or chills. Denies sinus pressure, nasal congestion, ear pain or sore throat. Denies chest congestion, productive cough or wheezing. Denies chest pains, palpitations and leg swelling Denies abdominal pain, nausea, vomiting,diarrhea or constipation.   Denies dysuria, frequency, hesitancy or incontinence. Mild joint pain, swelling and limitation in mobility. Denies headaches, seizures, numbness, or tingling. Denies uncontrolled depression, anxiety or insomnia. Denies skin break down or rash.   PE  BP 132/83   Pulse 87   Resp 16   Ht 5\' 11"  (1.803 m)   Wt (!) 385 lb (174.6 kg)   SpO2 92%   BMI 53.70 kg/m  Alert  and in no cardiopulmonary distress.  HEENT: No facial asymmetry, EOMI,     Neck supple .  Chest: Clear to auscultation bilaterally.  CVS: S1, S2 no murmurs, no S3.Regular rate.  ABD: Soft non tender.   Ext: No edema  MS: decreased  ROM spine, shoulders, hips and knees.  Skin: Intact, no ulcerations or rash noted.  Psych: t not anxious or depressed appearing.  CNS: CN 2-12 intact, power,  normal throughout.no focal deficits noted.   Assessment & Plan  Essential hypertension Controlled, no change in medication DASH diet and commitment to daily physical activity for a minimum of 30 minutes discussed and encouraged, as  a part of hypertension management. The importance of attaining a healthy weight is also discussed.     04/05/2022    3:50 PM 12/07/2021    3:16 PM 10/13/2021    3:29 PM 05/11/2021    2:38 PM 03/24/2021    9:50 AM 12/20/2020    1:45 PM 12/20/2020    1:30 PM  BP/Weight  Systolic BP 132 136 128 122 132 134 130  Diastolic BP 83 82 82 80 85 89 93  Wt. (Lbs) 385 373 376 373 368    BMI 53.7 kg/m2 52.02 kg/m2 52.44 kg/m2 52.02 kg/m2 51.33 kg/m2         Morbid obesity dteriorated  Patient re-educated about  the importance of commitment to a  minimum of 150 minutes of exercise per week as able.  The importance of healthy food choices with portion control discussed, as well as eating regularly and within a 12 hour window most days. The need to choose "clean , green" food 50 to 75% of the time is discussed, as well as to make water the primary drink and set a goal of 64 ounces water daily.       04/05/2022    3:50 PM 12/07/2021    3:16 PM 10/13/2021    3:29 PM  Weight /BMI  Weight 385 lb 373 lb 376 lb  Height 5\' 11"  (1.803 m) 5\' 11"  (1.803 m) 5\' 11"  (1.803 m)  BMI 53.7 kg/m2 52.02 kg/m2 52.44 kg/m2      Prediabetes Patient educated about the importance of limiting  Carbohydrate intake , the need to commit to daily physical activity for a minimum of 30 minutes , and to commit weight loss. The fact that changes in all these areas will reduce or eliminate all together the development of diabetes is stressed.      Latest Ref Rng & Units 10/19/2021    9:34 AM 05/11/2021    3:29 PM 11/07/2020    9:18 AM 11/04/2019    8:41 AM 12/31/2018    8:56 AM  Diabetic Labs  HbA1c 4.8 - 5.6 % 5.9  5.9   5.3    Chol 100 - 199 mg/dL 595   638  756  433   HDL >39 mg/dL 41   37  40  39   Calc LDL 0 - 99 mg/dL 83   82  92  87   Triglycerides 0 - 149 mg/dL 295   188  91  79   Creatinine 0.76 - 1.27 mg/dL 4.16  6.06  3.01  6.01  1.01       04/05/2022    3:50 PM 12/07/2021    3:16 PM 10/13/2021    3:29 PM  05/11/2021    2:38 PM 03/24/2021    9:50 AM 12/20/2020    1:45 PM 12/20/2020    1:30 PM  BP/Weight  Systolic BP 132 136 128 122 132 134 130  Diastolic BP 83 82 82 80 85 89 93  Wt. (Lbs) 385 373 376 373 368    BMI 53.7 kg/m2 52.02 kg/m2 52.44 kg/m2 52.02 kg/m2 51.33 kg/m2         No data to display            Seasonal allergies Controlled, no change in medication   Insomnia Controlled, no change in medication

## 2022-04-05 NOTE — Patient Instructions (Addendum)
F/U in end Jan / early  feb, call if you need me before  Flu vaccine today  Please get covid booster when available   Need TdaP at pharmacy also  CBC, TSH. Chem7 and EGFr and HBA1C today  Cot out sweet drinks and snacks PLEASE  Thanks for choosing [facility], we consider it a privelige to serve you.

## 2022-04-05 NOTE — Assessment & Plan Note (Signed)
Controlled, no change in medication DASH diet and commitment to daily physical activity for a minimum of 30 minutes discussed and encouraged, as a part of hypertension management. The importance of attaining a healthy weight is also discussed.     04/05/2022    3:50 PM 12/07/2021    3:16 PM 10/13/2021    3:29 PM 05/11/2021    2:38 PM 03/24/2021    9:50 AM 12/20/2020    1:45 PM 12/20/2020    1:30 PM  BP/Weight  Systolic BP 132 136 128 122 132 134 130  Diastolic BP 83 82 82 80 85 89 93  Wt. (Lbs) 385 373 376 373 368    BMI 53.7 kg/m2 52.02 kg/m2 52.44 kg/m2 52.02 kg/m2 51.33 kg/m2

## 2022-04-05 NOTE — Assessment & Plan Note (Signed)
Patient educated about the importance of limiting  Carbohydrate intake , the need to commit to daily physical activity for a minimum of 30 minutes , and to commit weight loss. The fact that changes in all these areas will reduce or eliminate all together the development of diabetes is stressed.      Latest Ref Rng & Units 10/19/2021    9:34 AM 05/11/2021    3:29 PM 11/07/2020    9:18 AM 11/04/2019    8:41 AM 12/31/2018    8:56 AM  Diabetic Labs  HbA1c 4.8 - 5.6 % 5.9  5.9   5.3    Chol 100 - 199 mg/dL 557   322  025  427   HDL >39 mg/dL 41   37  40  39   Calc LDL 0 - 99 mg/dL 83   82  92  87   Triglycerides 0 - 149 mg/dL 062   376  91  79   Creatinine 0.76 - 1.27 mg/dL 2.83  1.51  7.61  6.07  1.01       04/05/2022    3:50 PM 12/07/2021    3:16 PM 10/13/2021    3:29 PM 05/11/2021    2:38 PM 03/24/2021    9:50 AM 12/20/2020    1:45 PM 12/20/2020    1:30 PM  BP/Weight  Systolic BP 132 136 128 122 132 134 130  Diastolic BP 83 82 82 80 85 89 93  Wt. (Lbs) 385 373 376 373 368    BMI 53.7 kg/m2 52.02 kg/m2 52.44 kg/m2 52.02 kg/m2 51.33 kg/m2         No data to display

## 2022-04-05 NOTE — Assessment & Plan Note (Signed)
dteriorated  Patient re-educated about  the importance of commitment to a  minimum of 150 minutes of exercise per week as able.  The importance of healthy food choices with portion control discussed, as well as eating regularly and within a 12 hour window most days. The need to choose "clean , green" food 50 to 75% of the time is discussed, as well as to make water the primary drink and set a goal of 64 ounces water daily.       04/05/2022    3:50 PM 12/07/2021    3:16 PM 10/13/2021    3:29 PM  Weight /BMI  Weight 385 lb 373 lb 376 lb  Height 5\' 11"  (1.803 m) 5\' 11"  (1.803 m) 5\' 11"  (1.803 m)  BMI 53.7 kg/m2 52.02 kg/m2 52.44 kg/m2

## 2022-04-11 ENCOUNTER — Other Ambulatory Visit: Payer: Self-pay | Admitting: Family Medicine

## 2022-04-13 DIAGNOSIS — R7303 Prediabetes: Secondary | ICD-10-CM | POA: Diagnosis not present

## 2022-04-13 DIAGNOSIS — I10 Essential (primary) hypertension: Secondary | ICD-10-CM | POA: Diagnosis not present

## 2022-04-14 LAB — BMP8+EGFR
BUN/Creatinine Ratio: 14 (ref 9–20)
BUN: 14 mg/dL (ref 6–20)
CO2: 24 mmol/L (ref 20–29)
Calcium: 9.1 mg/dL (ref 8.7–10.2)
Chloride: 100 mmol/L (ref 96–106)
Creatinine, Ser: 0.99 mg/dL (ref 0.76–1.27)
Glucose: 176 mg/dL — ABNORMAL HIGH (ref 70–99)
Potassium: 4.1 mmol/L (ref 3.5–5.2)
Sodium: 141 mmol/L (ref 134–144)
eGFR: 99 mL/min/{1.73_m2} (ref >59)

## 2022-04-14 LAB — CBC
Hematocrit: 42.3 % (ref 37.5–51.0)
Hemoglobin: 14 g/dL (ref 13.0–17.7)
MCH: 29.5 pg (ref 26.6–33.0)
MCHC: 33.1 g/dL (ref 31.5–35.7)
MCV: 89 fL (ref 79–97)
Platelets: 242 10*3/uL (ref 150–450)
RBC: 4.75 x10E6/uL (ref 4.14–5.80)
RDW: 12.3 % (ref 11.6–15.4)
WBC: 8.3 10*3/uL (ref 3.4–10.8)

## 2022-04-14 LAB — HEMOGLOBIN A1C
Est. average glucose Bld gHb Est-mCnc: 123 mg/dL
Hgb A1c MFr Bld: 5.9 % — ABNORMAL HIGH (ref 4.8–5.6)

## 2022-04-14 LAB — TSH: TSH: 3.11 u[IU]/mL (ref 0.450–4.500)

## 2022-04-19 ENCOUNTER — Other Ambulatory Visit: Payer: Self-pay | Admitting: Family Medicine

## 2022-04-19 DIAGNOSIS — I10 Essential (primary) hypertension: Secondary | ICD-10-CM

## 2022-05-11 ENCOUNTER — Other Ambulatory Visit: Payer: Self-pay | Admitting: Family Medicine

## 2022-06-08 ENCOUNTER — Other Ambulatory Visit: Payer: Self-pay | Admitting: Family Medicine

## 2022-08-23 ENCOUNTER — Other Ambulatory Visit: Payer: Self-pay | Admitting: Family Medicine

## 2022-08-23 DIAGNOSIS — E559 Vitamin D deficiency, unspecified: Secondary | ICD-10-CM

## 2022-08-31 ENCOUNTER — Telehealth: Payer: Self-pay | Admitting: Family Medicine

## 2022-08-31 NOTE — Telephone Encounter (Signed)
[facility], [phone], fax # [phone]   Needs form signed about update med list

## 2022-09-03 NOTE — Telephone Encounter (Signed)
In Dr's office for signature

## 2022-09-05 ENCOUNTER — Ambulatory Visit: Payer: Medicare Other | Admitting: Family Medicine

## 2022-09-11 ENCOUNTER — Ambulatory Visit (INDEPENDENT_AMBULATORY_CARE_PROVIDER_SITE_OTHER): Payer: 59 | Admitting: Family Medicine

## 2022-09-11 ENCOUNTER — Encounter: Payer: Self-pay | Admitting: Family Medicine

## 2022-09-11 VITALS — BP 130/85 | HR 96 | Resp 16 | Ht 71.0 in | Wt 396.0 lb

## 2022-09-11 DIAGNOSIS — I10 Essential (primary) hypertension: Secondary | ICD-10-CM

## 2022-09-11 DIAGNOSIS — F5104 Psychophysiologic insomnia: Secondary | ICD-10-CM

## 2022-09-11 DIAGNOSIS — E559 Vitamin D deficiency, unspecified: Secondary | ICD-10-CM | POA: Diagnosis not present

## 2022-09-11 DIAGNOSIS — L210 Seborrhea capitis: Secondary | ICD-10-CM

## 2022-09-11 DIAGNOSIS — L03115 Cellulitis of right lower limb: Secondary | ICD-10-CM

## 2022-09-11 DIAGNOSIS — R7303 Prediabetes: Secondary | ICD-10-CM

## 2022-09-11 MED ORDER — CEPHALEXIN 500 MG PO CAPS: 500.0000 mg | ORAL_CAPSULE | Freq: Two times a day (BID) | ORAL | 0 refills | Status: DC

## 2022-09-11 NOTE — Patient Instructions (Addendum)
Annual exam in May, call if you need me sooner  HBA1C, chem 7 , and vit D this week [facility]  Keflex prescribed for 5 days for left leg pain  Form returned today  Thanks for choosing [facility], we consider it a privelige to serve you.

## 2022-09-11 NOTE — Progress Notes (Signed)
Scott Mendez     MRN: [id number]      DOB: January 07, 1981   HPI Scott Mendez is here runnny nose and cough this past weekend Fever up to 100.2 yeasterday am, relieved with tylenol, vomit two times yesterday what he just ate, no  vomit today, sick child exposure similar symptoms in past 2 weeks 4 day h/o left leg pain and mild redness per mother, no drainage or skin breakdown ROS Denies recent fever or chills.  Denies chest pains, palpitations and leg swelling .   Denies dysuria, frequency, hesitancy or incontinence. Denies joint pain, swelling and limitation in mobility. Denies headaches, seizures, numbness, or tingling. Denies depression, anxiety or insomnia. Marland Kitchen   PE  BP 130/85   Pulse 96   Resp 16   Ht 5' 11"$  (1.803 m)   Wt (!) 396 lb (179.6 kg)   SpO2 92%   BMI 55.23 kg/m   Patient ad in no cardiopulmonary distress.Autistic  HEENT: No facial asymmetry, EOMI,     Neck supple .  Chest: Clear to auscultation bilaterally.  CVS: S1, S2 no murmurs, no S3.Regular rate.  ABD: Soft non tender.   Ext: trace  edema  MS: Adequate ROM spine, shoulders, hips and knees.  Skin: Intact, mild warmth of left  leg.Unable to visualize leg in office  Psych: not anxious or depressed appearing.  CNS: CN 2-12 intact, power,  normal throughout.no focal deficits noted.   Assessment & Plan  Essential hypertension Controlled, no change in medication DASH diet and commitment to daily physical activity for a minimum of 30 minutes discussed and encouraged, as a part of hypertension management. The importance of attaining a healthy weight is also discussed.     09/11/2022    3:32 PM 04/05/2022    3:50 PM 12/07/2021    3:16 PM 10/13/2021    3:29 PM 05/11/2021    2:38 PM 03/24/2021    9:50 AM 12/20/2020    1:45 PM  BP/Weight  Systolic BP [id number] Q000111Q [id number] 0000000 123XX123 Q000111Q Q000111Q  Diastolic BP 85 83 82 82 80 85 89  Wt. (Lbs) 396 385 373 376 373 368   BMI 55.23 kg/m2 53.7 kg/m2 52.02 kg/m2  52.44 kg/m2 52.02 kg/m2 51.33 kg/m2      Controlled, no change in medication    Prediabetes Patient educated about the importance of limiting  Carbohydrate intake , the need to commit to daily physical activity for a minimum of 30 minutes , and to commit weight loss. The fact that changes in all these areas will reduce or eliminate all together the development of diabetes is stressed.  Unchanged    Latest Ref Rng & Units 04/13/2022    3:37 PM 10/19/2021    9:34 AM 05/11/2021    3:29 PM 11/07/2020    9:18 AM 11/04/2019    8:41 AM  Diabetic Labs  HbA1c 4.8 - 5.6 % 5.9  5.9  5.9   5.3   Chol 100 - 199 mg/dL  147   142  151   HDL >39 mg/dL  41   37  40   Calc LDL 0 - 99 mg/dL  83   82  92   Triglycerides 0 - 149 mg/dL  127   127  91   Creatinine 0.76 - 1.27 mg/dL 0.99  1.02  0.93  1.10  1.01       09/11/2022    3:32 PM 04/05/2022    3:50 PM 12/07/2021  3:16 PM 10/13/2021    3:29 PM 05/11/2021    2:38 PM 03/24/2021    9:50 AM 12/20/2020    1:45 PM  BP/Weight  Systolic BP [id number] Q000111Q [id number] 0000000 123XX123 Q000111Q Q000111Q  Diastolic BP 85 83 82 82 80 85 89  Wt. (Lbs) 396 385 373 376 373 368   BMI 55.23 kg/m2 53.7 kg/m2 52.02 kg/m2 52.44 kg/m2 52.02 kg/m2 51.33 kg/m2        No data to display            Morbid obesity Worsened Patient re-educated about  the importance of commitment to a  minimum of 150 minutes of exercise per week as able.  The importance of healthy food choices with portion control discussed, as well as eating regularly and within a 12 hour window most days. The need to choose "clean , green" food 50 to 75% of the time is discussed, as well as to make water the primary drink and set a goal of 64 ounces water daily.       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

## 2022-09-13 NOTE — Assessment & Plan Note (Signed)
Patient educated about the importance of limiting  Carbohydrate intake , the need to commit to daily physical activity for a minimum of 30 minutes , and to commit weight loss. The fact that changes in all these areas will reduce or eliminate all together the development of diabetes is stressed.  Unchanged    Latest Ref Rng & Units 04/13/2022    3:37 PM 10/19/2021    9:34 AM 05/11/2021    3:29 PM 11/07/2020    9:18 AM 11/04/2019    8:41 AM  Diabetic Labs  HbA1c 4.8 - 5.6 % 5.9  5.9  5.9   5.3   Chol 100 - 199 mg/dL  147   142  151   HDL >39 mg/dL  41   37  40   Calc LDL 0 - 99 mg/dL  83   82  92   Triglycerides 0 - 149 mg/dL  127   127  91   Creatinine 0.76 - 1.27 mg/dL 0.99  1.02  0.93  1.10  1.01       09/11/2022    3:32 PM 04/05/2022    3:50 PM 12/07/2021    3:16 PM 10/13/2021    3:29 PM 05/11/2021    2:38 PM 03/24/2021    9:50 AM 12/20/2020    1:45 PM  BP/Weight  Systolic BP 660 630 160 109 323 557 322  Diastolic BP 85 83 82 82 80 85 89  Wt. (Lbs) 396 385 373 376 373 368   BMI 55.23 kg/m2 53.7 kg/m2 52.02 kg/m2 52.44 kg/m2 52.02 kg/m2 51.33 kg/m2        No data to display

## 2022-09-13 NOTE — Assessment & Plan Note (Addendum)
Worsened Patient re-educated about  the importance of commitment to a  minimum of 150 minutes of exercise per week as able.  The importance of healthy food choices with portion control discussed, as well as eating regularly and within a 12 hour window most days. The need to choose "clean , green" food 50 to 75% of the time is discussed, as well as to make water the primary drink and set a goal of 64 ounces water daily.       09/11/2022    3:32 PM 04/05/2022    3:50 PM 12/07/2021    3:16 PM  Weight /BMI  Weight 396 lb 385 lb 373 lb  Height 5' 11"$  (1.803 m) 5' 11"$  (1.803 m) 5' 11"$  (1.803 m)  BMI 55.23 kg/m2 53.7 kg/m2 52.02 kg/m2

## 2022-09-13 NOTE — Assessment & Plan Note (Addendum)
Controlled, no change in medication DASH diet and commitment to daily physical activity for a minimum of 30 minutes discussed and encouraged, as a part of hypertension management. The importance of attaining a healthy weight is also discussed.     09/11/2022    3:32 PM 04/05/2022    3:50 PM 12/07/2021    3:16 PM 10/13/2021    3:29 PM 05/11/2021    2:38 PM 03/24/2021    9:50 AM 12/20/2020    1:45 PM  BP/Weight  Systolic BP [id number] Q000111Q [id number] 0000000 [id number] Q000111Q Q000111Q  Diastolic BP 85 83 82 82 80 85 89  Wt. (Lbs) 396 385 373 376 373 368   BMI 55.23 kg/m2 53.7 kg/m2 52.02 kg/m2 52.44 kg/m2 52.02 kg/m2 51.33 kg/m2      Controlled, no change in medication

## 2022-09-14 DIAGNOSIS — R7303 Prediabetes: Secondary | ICD-10-CM | POA: Diagnosis not present

## 2022-09-14 DIAGNOSIS — I10 Essential (primary) hypertension: Secondary | ICD-10-CM | POA: Diagnosis not present

## 2022-09-14 DIAGNOSIS — E559 Vitamin D deficiency, unspecified: Secondary | ICD-10-CM | POA: Diagnosis not present

## 2022-09-15 ENCOUNTER — Encounter: Payer: Self-pay | Admitting: Family Medicine

## 2022-09-15 LAB — VITAMIN D 25 HYDROXY (VIT D DEFICIENCY, FRACTURES): Vit D, 25-Hydroxy: 31.5 ng/mL (ref 30.0–100.0)

## 2022-09-15 LAB — HEMOGLOBIN A1C
Est. average glucose Bld gHb Est-mCnc: 131 mg/dL
Hgb A1c MFr Bld: 6.2 % — ABNORMAL HIGH (ref 4.8–5.6)

## 2022-09-15 LAB — BMP8+EGFR
BUN/Creatinine Ratio: 20 (ref 9–20)
BUN: 20 mg/dL (ref 6–20)
CO2: 23 mmol/L (ref 20–29)
Calcium: 9.1 mg/dL (ref 8.7–10.2)
Chloride: 102 mmol/L (ref 96–106)
Creatinine, Ser: 0.98 mg/dL (ref 0.76–1.27)
Glucose: 143 mg/dL — ABNORMAL HIGH (ref 70–99)
Potassium: 4.2 mmol/L (ref 3.5–5.2)
Sodium: 141 mmol/L (ref 134–144)
eGFR: 101 mL/min/{1.73_m2} (ref >59)

## 2022-09-15 NOTE — Assessment & Plan Note (Signed)
Mild , right leg , 5 day course of keflex

## 2022-09-15 NOTE — Assessment & Plan Note (Signed)
Controlled, no change in medication  

## 2022-09-17 ENCOUNTER — Telehealth: Payer: Self-pay | Admitting: Family Medicine

## 2022-09-17 NOTE — Telephone Encounter (Signed)
Pt returning call

## 2022-09-17 NOTE — Telephone Encounter (Signed)
Spoke with patient's guardian.

## 2022-09-25 ENCOUNTER — Ambulatory Visit (INDEPENDENT_AMBULATORY_CARE_PROVIDER_SITE_OTHER): Payer: 59 | Admitting: Internal Medicine

## 2022-09-25 ENCOUNTER — Other Ambulatory Visit: Payer: Self-pay | Admitting: Family Medicine

## 2022-09-25 ENCOUNTER — Encounter: Payer: Self-pay | Admitting: Internal Medicine

## 2022-09-25 DIAGNOSIS — Z0000 Encounter for general adult medical examination without abnormal findings: Secondary | ICD-10-CM | POA: Diagnosis not present

## 2022-09-25 NOTE — Patient Instructions (Signed)
  [name] ,  Thank you for taking time to come for your Medicare Wellness Visit. I appreciate your ongoing commitment to your health goals. Please review the following plan we discussed and let me know if I can assist you in the future.   These are the goals we discussed: Trying to work on being more independent on ADL's.    This is a list of the screening recommended for you and due dates:  Health Maintenance  Topic Date Due   DTaP/Tdap/Td vaccine (3 - Td or Tdap) 12/18/2021   COVID-19 Vaccine (5 - 2023-24 season) 04/06/2022   Medicare Annual Wellness Visit  12/08/2022   Flu Shot  Completed   Hepatitis C Screening: USPSTF Recommendation to screen - Ages 18-79 yo.  Completed   HIV Screening  Completed   HPV Vaccine  Aged Out

## 2022-09-25 NOTE — Progress Notes (Addendum)
Subjective:  This is a telephone encounter between [employer] and Scott Mendez on 09/25/2022 for AWV. The visit was conducted with the patient's legal guardian/mother located at home and Scott Mendez at [facility]. The patient's mother's identity was confirmed using the patient's DOB and current address. The patient mother/legal guardian has consented to being evaluated through a telephone encounter and understands the associated risks (an examination cannot be done and the patient may need to come in for an appointment) / benefits (allows the patient to remain at home, decreasing exposure to coronavirus).     Scott Mendez is a 42 y.o. male who presents for Medicare Annual/Subsequent preventive examination.  Review of Systems    Review of Systems  All other systems reviewed and are negative.    Objective:    There were no vitals filed for this visit. There is no height or weight on file to calculate BMI.     07/05/2021   11:04 AM 07/05/2021   10:28 AM 04/24/2021    9:43 AM 12/20/2020    9:49 AM 07/07/2020   11:02 AM 06/23/2020    1:42 PM 07/07/2018    5:20 PM  Advanced Directives  Does Patient Have a Medical Advance Directive? No No No No No No No  Would patient like information on creating a medical advance directive? Yes (ED - Information included in AVS) Yes (ED - Information included in AVS) No - Patient declined  No - Patient declined No - Patient declined No - Patient declined    Current Medications (verified) Outpatient Encounter Medications as of 09/25/2022  Medication Sig   acetaminophen (TYLENOL) 500 MG tablet Take 1,000 mg by mouth every 6 (six) hours as needed for moderate pain or headache.   cephALEXin (KEFLEX) 500 MG capsule Take 1 capsule (500 mg total) by mouth 2 (two) times daily.   furosemide (LASIX) 20 MG tablet TAKE 1 TABLET BY MOUTH TWICE A DAY   KLOR-CON M20 20 MEQ tablet TAKE 1 TABLET BY MOUTH TWICE A DAY   loratadine (CLARITIN) 10 MG tablet  TAKE 1 TABLET BY MOUTH EVERY DAY   montelukast (SINGULAIR) 10 MG tablet TAKE 1 TABLET BY MOUTH EVERYDAY AT BEDTIME   olmesartan-hydrochlorothiazide (BENICAR HCT) 40-12.5 MG tablet TAKE 1 TABLET BY MOUTH EVERY DAY   selenium sulfide (SELSUN) 1 % LOTN Wash  Hair daily with 12 cc shampoo for 3 weeks , then twice weekly (Patient taking differently: Apply 1 application  topically 2 (two) times a week. Wash hair with 12 cc)   traZODone (DESYREL) 100 MG tablet TAKE 1 TABLET BY MOUTH EVERYDAY AT BEDTIME   Vitamin D, Ergocalciferol, (DRISDOL) 1.25 MG (50000 UNIT) CAPS capsule TAKE 1 CAPSULE BY MOUTH ONE TIME PER WEEK   No facility-administered encounter medications on file as of 09/25/2022.    Allergies (verified) Patient has no known allergies.   History: Past Medical History:  Diagnosis Date   Autism    Hypertension    states under control with meds., has been on med. x 8 yr.   Morbid obesity with BMI of 45.0-49.9, adult ([facility])    Nonrestorable tooth 02/2018   x 2   Nonverbal    Sleep apnea    mild - no CPAP   Past Surgical History:  Procedure Laterality Date   CYST EXCISION Right 03/03/2002   mandible   MULTIPLE TOOTH EXTRACTIONS  03/03/2002   #1, 16, 17, 30, 32   TOENAIL EXCISION Bilateral    great toe  TOOTH EXTRACTION Right 09/03/2014   Procedure: EXTRACTION MOLAR LOWER RIGHT;  Surgeon: Gae Bon, DDS;  Location: [place];  Service: Oral Surgery;  Laterality: Right;   TOOTH EXTRACTION N/A 03/06/2018   Procedure: EXTRACTION X 1;  Surgeon: Diona Browner, DDS;  Location: [place];  Service: Oral Surgery;  Laterality: N/A;   TOOTH EXTRACTION N/A 12/20/2020   Procedure: DENTAL RESTORATION/EXTRACTIONS TEETH THIRTEEN AND NINETEEN.;  Surgeon: Diona Browner, DMD;  Location: [place];  Service: Oral Surgery;  Laterality: N/A;   Family History  Problem Relation Age of Onset   Hypertension Mother    Hypertension Father    Diabetes Father    Heart disease Maternal Grandfather     Diabetes Paternal Grandmother    Seizures Maternal Uncle    CVA Maternal Grandmother    Heart disease Paternal Grandfather    Social History   Socioeconomic History   Marital status: Single    Spouse name: N/A   Number of children: 0   Years of education: Not on file   Highest education level: Not on file  Occupational History   Not on file  Tobacco Use   Smoking status: Never   Smokeless tobacco: Never  Vaping Use   Vaping Use: Never used  Substance and Sexual Activity   Alcohol use: No   Drug use: No   Sexual activity: Never  Other Topics Concern   Not on file  Social History Narrative   Lives with Mother and sister    Social Determinants of Health   Financial Resource Strain: Low Risk  (06/23/2020)   Overall Financial Resource Strain (CARDIA)    Difficulty of Paying Living Expenses: Not hard at all  Food Insecurity: No Food Insecurity (06/23/2020)   Hunger Vital Sign    Worried About Running Out of Food in the Last Year: Never true    [place] in the Last Year: Never true  Transportation Needs: No Transportation Needs (06/23/2020)   PRAPARE - Hydrologist (Medical): No    Lack of Transportation (Non-Medical): No  Physical Activity: Insufficiently Active (06/23/2020)   Exercise Vital Sign    Days of Exercise per Week: 4 days    Minutes of Exercise per Session: 30 min  Stress: No Stress Concern Present (06/23/2020)   [place]    Feeling of Stress : Not at all  Social Connections: Socially Isolated (06/23/2020)   Social Connection and Isolation Panel [NHANES]    Frequency of Communication with Friends and Family: Never    Frequency of Social Gatherings with Friends and Family: Twice a week    Attends Religious Services: More than 4 times per year    Active Member of [employer] or Organizations: No    Attends Music therapist: Never    Marital  Status: Never married    Tobacco Counseling Counseling given: Not Answered   Clinical Intake:  Pre-visit preparation completed: Yes  Pain : No/denies pain        How often do you need to have someone help you when you read instructions, pamphlets, or other written materials from your doctor or pharmacy?: 5 - Always  Diabetic?No    Activities of Daily Living    09/25/2022    3:24 PM  In your present state of health, do you have any difficulty performing the following activities:  Hearing? 0  Vision? 0  Difficulty concentrating or making  decisions? 1  Walking or climbing stairs? 1  Dressing or bathing? 1  Doing errands, shopping? 1    Patient Care Team: Fayrene Helper, MD as PCP - General Gilford Rile Arville Go, MD (Inactive) as Consulting Physician (Psychiatry)  Indicate any recent Medical Services you may have received from other than [facility] providers in the past year (date may be approximate).     Assessment:   This is a routine wellness examination for [place].  Hearing/Vision screen No results found.  Dietary issues and exercise activities discussed:     Goals Addressed   None    Depression Screen    09/25/2022    3:24 PM 09/11/2022    3:34 PM 03/24/2021    9:52 AM 11/08/2020   10:51 AM 10/04/2020    8:47 AM 06/23/2020    1:43 PM 06/23/2020    1:40 PM  PHQ 2/9 Scores  PHQ - 2 Score 0 0 0 0 0 0 0    Fall Risk    09/25/2022    3:24 PM 09/11/2022    3:33 PM 04/05/2022    3:52 PM 12/07/2021    3:17 PM 07/05/2021   10:54 AM  [place] in the past year? 0 0 0 0 0  Number falls in past yr: 0 0 0 0 0  Injury with Fall? 0 0 0 0 0    FALL RISK PREVENTION PERTAINING TO THE HOME:  Any stairs in or around the home? No  If so, are there any without handrails? No  Home free of loose throw rugs in walkways, pet beds, electrical cords, etc? Yes  Adequate lighting in your home to reduce risk of falls? Yes   ASSISTIVE DEVICES UTILIZED TO PREVENT  FALLS:  Life alert? No  Use of a cane, walker or w/c? No  Grab bars in the bathroom? No  Shower chair or bench in shower? No  Elevated toilet seat or a handicapped toilet? No     Cognitive Function:    03/13/2017    7:41 PM  MMSE - Mini Mental State Exam  Not completed: Unable to complete        Immunizations Immunization History  Administered Date(s) Administered   H1N1 06/03/2008   Influenza Split 04/22/2012   Influenza Whole 05/07/2007, 05/02/2009, 04/17/2010   Influenza,inj,Quad PF,6+ Mos 05/18/2013, 07/20/2014, 04/04/2015, 04/04/2016, 04/10/2017, 05/05/2018, 04/20/2019, 04/13/2020, 05/11/2021, 04/05/2022   Moderna Sars-Covid-2 Vaccination 12/07/2019, 01/06/2020   Td 11/23/2002   Tdap 12/19/2011    TDAP status: Due, Education has been provided regarding the importance of this vaccine. Advised may receive this vaccine at local pharmacy or Health Dept. Aware to provide a copy of the vaccination record if obtained from local pharmacy or Health Dept. Verbalized acceptance and understanding.  Flu Vaccine status: Up to date  Covid-19 vaccine status: Information provided on how to obtain vaccines.   Qualifies for Shingles Vaccine? No   Zostavax completed No   Shingrix Completed?: No.    Education has been provided regarding the importance of this vaccine. Patient has been advised to call insurance company to determine out of pocket expense if they have not yet received this vaccine. Advised may also receive vaccine at local pharmacy or Health Dept. Verbalized acceptance and understanding.  Screening Tests Health Maintenance  Topic Date Due   DTaP/Tdap/Td (3 - Td or Tdap) 12/18/2021   COVID-19 Vaccine (5 - 2023-24 season) 04/06/2022   Medicare Annual Wellness (AWV)  12/08/2022   INFLUENZA VACCINE  Completed   Hepatitis C Screening  Completed   HIV Screening  Completed   HPV VACCINES  Aged Out    Health Maintenance  Health Maintenance Due  Topic Date Due    DTaP/Tdap/Td (3 - Td or Tdap) 12/18/2021   COVID-19 Vaccine (5 - 2023-24 season) 04/06/2022    Additional Screening:  Hepatitis C Screening: does qualify; Completed 11/07/2020  Vision Screening: Recommended annual ophthalmology exams for early detection of glaucoma and other disorders of the eye. Is the patient up to date with their annual eye exam?  Yes  Who is the provider or what is the name of the office in which the patient attends annual eye exams? N/a, vision check in PCP office have been normal If pt is not established with a provider, would they like to be referred to a provider to establish care? No .   Dental Screening: Recommended annual dental exams for proper oral hygiene  Community Resource Referral / Chronic Care Management: CRR required this visit?  No   CCM required this visit?  No      Plan:     I have personally reviewed and noted the following in the patient's chart:   Medical and social history Use of alcohol, tobacco or illicit drugs  Current medications and supplements including opioid prescriptions. Patient is not currently taking opioid prescriptions. Functional ability and status Nutritional status Physical activity Advanced directives List of other physicians Hospitalizations, surgeries, and ER visits in previous 12 months Vitals Screenings to include cognitive, depression, and falls Referrals and appointments  In addition, I have reviewed and discussed with patient certain preventive protocols, quality metrics, and best practice recommendations. A written personalized care plan for preventive services as well as general preventive health recommendations were provided to patient.     Scott Dy, MD   09/25/2022

## 2022-10-06 ENCOUNTER — Other Ambulatory Visit: Payer: Self-pay | Admitting: Family Medicine

## 2022-10-14 ENCOUNTER — Other Ambulatory Visit: Payer: Self-pay | Admitting: Family Medicine

## 2022-10-14 DIAGNOSIS — I10 Essential (primary) hypertension: Secondary | ICD-10-CM

## 2022-12-05 ENCOUNTER — Other Ambulatory Visit: Payer: Self-pay | Admitting: Family Medicine

## 2022-12-13 ENCOUNTER — Ambulatory Visit (INDEPENDENT_AMBULATORY_CARE_PROVIDER_SITE_OTHER): Payer: 59 | Admitting: Family Medicine

## 2022-12-13 ENCOUNTER — Encounter: Payer: Self-pay | Admitting: Family Medicine

## 2022-12-13 VITALS — BP 139/86 | HR 105 | Ht 71.0 in | Wt 396.1 lb

## 2022-12-13 DIAGNOSIS — E559 Vitamin D deficiency, unspecified: Secondary | ICD-10-CM

## 2022-12-13 DIAGNOSIS — Z13220 Encounter for screening for lipoid disorders: Secondary | ICD-10-CM

## 2022-12-13 DIAGNOSIS — Z23 Encounter for immunization: Secondary | ICD-10-CM

## 2022-12-13 DIAGNOSIS — I10 Essential (primary) hypertension: Secondary | ICD-10-CM

## 2022-12-13 DIAGNOSIS — E1159 Type 2 diabetes mellitus with other circulatory complications: Secondary | ICD-10-CM

## 2022-12-13 DIAGNOSIS — R7989 Other specified abnormal findings of blood chemistry: Secondary | ICD-10-CM

## 2022-12-13 DIAGNOSIS — R7303 Prediabetes: Secondary | ICD-10-CM

## 2022-12-13 DIAGNOSIS — M7989 Other specified soft tissue disorders: Secondary | ICD-10-CM

## 2022-12-13 DIAGNOSIS — Z0001 Encounter for general adult medical examination with abnormal findings: Secondary | ICD-10-CM | POA: Diagnosis not present

## 2022-12-13 MED ORDER — UNABLE TO FIND: 0 refills | Status: DC

## 2022-12-13 NOTE — Patient Instructions (Addendum)
F/U in 4 months, call if you need me sooner  Nurse please refer to weight loss center in [facility] today, cmp and EGFr, lipid panel, hBA1C and TSH   Nurse pls print TdAP script, remind Mom to take [name] to get his tetanus shot which is overdue  Thanks for choosing [facility], we consider it a privelige to serve you.

## 2022-12-14 ENCOUNTER — Other Ambulatory Visit: Payer: Self-pay

## 2022-12-14 DIAGNOSIS — R7989 Other specified abnormal findings of blood chemistry: Secondary | ICD-10-CM

## 2022-12-14 LAB — CMP14+EGFR
ALT: 30 IU/L (ref 0–44)
AST: 22 IU/L (ref 0–40)
Albumin/Globulin Ratio: 1.8 (ref 1.2–2.2)
Albumin: 4.2 g/dL (ref 4.1–5.1)
Alkaline Phosphatase: 108 IU/L (ref 44–121)
BUN/Creatinine Ratio: 18 (ref 9–20)
BUN: 17 mg/dL (ref 6–20)
Bilirubin Total: 0.4 mg/dL (ref 0.0–1.2)
CO2: 24 mmol/L (ref 20–29)
Calcium: 9.2 mg/dL (ref 8.7–10.2)
Chloride: 99 mmol/L (ref 96–106)
Creatinine, Ser: 0.93 mg/dL (ref 0.76–1.27)
Globulin, Total: 2.3 g/dL (ref 1.5–4.5)
Glucose: 126 mg/dL — ABNORMAL HIGH (ref 70–99)
Potassium: 3.9 mmol/L (ref 3.5–5.2)
Sodium: 140 mmol/L (ref 134–144)
Total Protein: 6.5 g/dL (ref 6.0–8.5)
eGFR: 107 mL/min/{1.73_m2} (ref >59)

## 2022-12-14 LAB — HEMOGLOBIN A1C
Est. average glucose Bld gHb Est-mCnc: 143 mg/dL
Hgb A1c MFr Bld: 6.6 % — ABNORMAL HIGH (ref 4.8–5.6)

## 2022-12-14 LAB — LIPID PANEL
Chol/HDL Ratio: 3.8 ratio (ref 0.0–5.0)
Cholesterol, Total: 140 mg/dL (ref 100–199)
HDL: 37 mg/dL — ABNORMAL LOW (ref >39)
LDL Chol Calc (NIH): 71 mg/dL (ref 0–99)
Triglycerides: 189 mg/dL — ABNORMAL HIGH (ref 0–149)
VLDL Cholesterol Cal: 32 mg/dL (ref 5–40)

## 2022-12-14 LAB — TSH: TSH: 5.46 u[IU]/mL — ABNORMAL HIGH (ref 0.450–4.500)

## 2022-12-16 ENCOUNTER — Encounter: Payer: Self-pay | Admitting: Family Medicine

## 2022-12-16 NOTE — Assessment & Plan Note (Signed)
Chronic and unchanged , has compression hose which he wears

## 2022-12-16 NOTE — Progress Notes (Signed)
Scott Mendez     MRN: [id number]      DOB: 01/06/1981  Chief Complaint  Patient presents with   Annual Exam    CPE mother concerned with leg possibly swelling     HPI: Patient is in for annual physical exam. Labs drawn on day of visit are reviewed, he is referred to Endocrinology and weight loss clinic. C/o mild leg swelling Immunization is reviewed .    PE; BP 139/86 (BP Location: Left Arm, Patient Position: Sitting, Cuff Size: Large)   Pulse (!) 105   Ht 5\' 11"  (1.803 m)   Wt (!) 396 lb 1.3 oz (179.7 kg)   SpO2 91%   BMI 55.24 kg/m  Autistic male, morbidly obese Afebrile. HEENT No facial trauma or asymetry. Sinuses non tender. EOMI External ears normal,  Neck: supple, no adenopathy,JVD or thyromegaly.No bruits.  Chest: Clear to ascultation bilaterally.No crackles or wheezes. Non tender to palpation  Cardiovascular system; Heart sounds normal,  S1 and  S2 ,no S3.  No murmur,Peripheral pulses normal.  Abdomen: Soft, non tender  Musculoskeletal exam: Decreased though adequate  ROM of spine, hips , shoulders and knees. No deformity ,swelling or crepitus noted. No muscle wasting or atrophy.   Neurologic: Power, tone ,sensation  normal throughout.  disturbance in gait. No tremor.  Skin: Intact, no ulceration, erythema , scaling or rash noted. Pigmentation normal throughout  Psych; Normal mood and affect. Judgement and concentration normal   Assessment & Plan:  Encounter for Medicare annual examination with abnormal findings Annual exam as documented. Counseling done  re healthy lifestyle involving commitment to 150 minutes exercise per week, heart healthy diet, and attaining healthy weight.The importance of adequate sleep also discussed. Immunization  needs are specifically addressed at this visit.   Essential hypertension DASH diet and commitment to daily physical activity for a minimum of 30 minutes discussed and encouraged, as a part of  hypertension management. The importance of attaining a healthy weight is also discussed.     12/13/2022    3:20 PM 12/13/2022    3:19 PM 09/11/2022    3:32 PM 04/05/2022    3:50 PM 12/07/2021    3:16 PM 10/13/2021    3:29 PM 05/11/2021    2:38 PM  BP/Weight  Systolic BP 139 144 130 132 136 128 122  Diastolic BP 86 89 85 83 82 82 80  Wt. (Lbs)  396.08 396 385 373 376 373  BMI  55.24 kg/m2 55.23 kg/m2 53.7 kg/m2 52.02 kg/m2 52.44 kg/m2 52.02 kg/m2       Prediabetes Patient educated about the importance of limiting  Carbohydrate intake , the need to commit to daily physical activity for a minimum of 30 minutes , and to commit weight loss. The fact that changes in all these areas will reduce or eliminate all together the development of diabetes is stressed.  Now diabetic with ,morbid obesity refer Endocrin, also abn TSH     Latest Ref Rng & Units 12/13/2022    4:17 PM 09/14/2022    3:27 PM 04/13/2022    3:37 PM 10/19/2021    9:34 AM 05/11/2021    3:29 PM  Diabetic Labs  HbA1c 4.8 - 5.6 % 6.6  6.2  5.9  5.9  5.9   Chol 100 - 199 mg/dL 409    811    HDL >91 mg/dL 37    41    Calc LDL 0 - 99 mg/dL 71    83  Triglycerides 0 - 149 mg/dL 161    096    Creatinine 0.76 - 1.27 mg/dL 0.45  4.09  8.11  9.14  0.93       12/13/2022    3:20 PM 12/13/2022    3:19 PM 09/11/2022    3:32 PM 04/05/2022    3:50 PM 12/07/2021    3:16 PM 10/13/2021    3:29 PM 05/11/2021    2:38 PM  BP/Weight  Systolic BP 139 144 130 132 136 128 122  Diastolic BP 86 89 85 83 82 82 80  Wt. (Lbs)  396.08 396 385 373 376 373  BMI  55.24 kg/m2 55.23 kg/m2 53.7 kg/m2 52.02 kg/m2 52.44 kg/m2 52.02 kg/m2       No data to display            Right leg swelling Chronic and unchanged , has compression hose which he wears

## 2022-12-16 NOTE — Assessment & Plan Note (Signed)
Patient educated about the importance of limiting  Carbohydrate intake , the need to commit to daily physical activity for a minimum of 30 minutes , and to commit weight loss. The fact that changes in all these areas will reduce or eliminate all together the development of diabetes is stressed.  Now diabetic with ,morbid obesity refer Endocrin, also abn TSH     Latest Ref Rng & Units 12/13/2022    4:17 PM 09/14/2022    3:27 PM 04/13/2022    3:37 PM 10/19/2021    9:34 AM 05/11/2021    3:29 PM  Diabetic Labs  HbA1c 4.8 - 5.6 % 6.6  6.2  5.9  5.9  5.9   Chol 100 - 199 mg/dL 295    621    HDL >30 mg/dL 37    41    Calc LDL 0 - 99 mg/dL 71    83    Triglycerides 0 - 149 mg/dL 865    784    Creatinine 0.76 - 1.27 mg/dL 6.96  2.95  2.84  1.32  0.93       12/13/2022    3:20 PM 12/13/2022    3:19 PM 09/11/2022    3:32 PM 04/05/2022    3:50 PM 12/07/2021    3:16 PM 10/13/2021    3:29 PM 05/11/2021    2:38 PM  BP/Weight  Systolic BP 139 144 130 132 136 128 122  Diastolic BP 86 89 85 83 82 82 80  Wt. (Lbs)  396.08 396 385 373 376 373  BMI  55.24 kg/m2 55.23 kg/m2 53.7 kg/m2 52.02 kg/m2 52.44 kg/m2 52.02 kg/m2       No data to display

## 2022-12-16 NOTE — Assessment & Plan Note (Signed)
Annual exam as documented. Counseling done  re healthy lifestyle involving commitment to 150 minutes exercise per week, heart healthy diet, and attaining healthy weight.The importance of adequate sleep also discussed. . Immunization  needs are specifically addressed at this visit.  

## 2022-12-16 NOTE — Assessment & Plan Note (Signed)
DASH diet and commitment to daily physical activity for a minimum of 30 minutes discussed and encouraged, as a part of hypertension management. The importance of attaining a healthy weight is also discussed.     12/13/2022    3:20 PM 12/13/2022    3:19 PM 09/11/2022    3:32 PM 04/05/2022    3:50 PM 12/07/2021    3:16 PM 10/13/2021    3:29 PM 05/11/2021    2:38 PM  BP/Weight  Systolic BP 139 144 130 132 136 128 122  Diastolic BP 86 89 85 83 82 82 80  Wt. (Lbs)  396.08 396 385 373 376 373  BMI  55.24 kg/m2 55.23 kg/m2 53.7 kg/m2 52.02 kg/m2 52.44 kg/m2 52.02 kg/m2

## 2022-12-17 ENCOUNTER — Other Ambulatory Visit: Payer: Self-pay | Admitting: Family Medicine

## 2022-12-18 LAB — T4, FREE: Free T4: 1.25 ng/dL (ref 0.82–1.77)

## 2022-12-18 LAB — SPECIMEN STATUS REPORT

## 2022-12-18 LAB — T3, FREE: T3, Free: 3.1 pg/mL (ref 2.0–4.4)

## 2022-12-29 ENCOUNTER — Other Ambulatory Visit: Payer: Self-pay | Admitting: Family Medicine

## 2022-12-29 DIAGNOSIS — E559 Vitamin D deficiency, unspecified: Secondary | ICD-10-CM

## 2023-01-10 ENCOUNTER — Encounter: Payer: Self-pay | Admitting: Family Medicine

## 2023-01-10 ENCOUNTER — Ambulatory Visit (INDEPENDENT_AMBULATORY_CARE_PROVIDER_SITE_OTHER): Payer: 59 | Admitting: Family Medicine

## 2023-01-10 VITALS — BP 125/83 | HR 109 | Ht 69.0 in | Wt 394.0 lb

## 2023-01-10 DIAGNOSIS — M7989 Other specified soft tissue disorders: Secondary | ICD-10-CM

## 2023-01-10 NOTE — Patient Instructions (Signed)
        Great to see you today.   - Please take medications as prescribed. - Follow up with your primary health provider if any health concerns arises. - If symptoms worsen please contact your primary care provider and/or visit the emergency department.  

## 2023-01-10 NOTE — Progress Notes (Signed)
Patient Office Visit   Subjective   Patient ID: Scott Mendez, male    DOB: 1980-10-11  Age: 42 y.o. MRN: [id number]  CC:  Chief Complaint  Patient presents with   Leg Swelling    Patient is here for L leg swelling starting 4 days ago.     HPI Scott Mendez 42 year old male, presents to the clinic for bilateral leg swelling on Lasix 20 mg twice daily with no improvement.  He  has a past medical history of Autism, Hypertension, Morbid obesity with BMI of 45.0-49.9, adult ([facility]), Nonrestorable tooth (02/2018), Nonverbal, and Sleep apnea.For the details of today's visit, please refer to assessment and plan.   HPI    Outpatient Encounter Medications as of 01/10/2023  Medication Sig   acetaminophen (TYLENOL) 500 MG tablet Take 1,000 mg by mouth every 6 (six) hours as needed for moderate pain or headache.   furosemide (LASIX) 20 MG tablet TAKE 1 TABLET BY MOUTH TWICE A DAY   KLOR-CON M20 20 MEQ tablet TAKE 1 TABLET BY MOUTH TWICE A DAY   loratadine (CLARITIN) 10 MG tablet TAKE 1 TABLET BY MOUTH EVERY DAY   montelukast (SINGULAIR) 10 MG tablet TAKE 1 TABLET BY MOUTH EVERYDAY AT BEDTIME   olmesartan-hydrochlorothiazide (BENICAR HCT) 40-12.5 MG tablet TAKE 1 TABLET BY MOUTH EVERY DAY   selenium sulfide (SELSUN) 1 % LOTN Wash  Hair daily with 12 cc shampoo for 3 weeks , then twice weekly (Patient taking differently: Apply 1 application  topically 2 (two) times a week. Wash hair with 12 cc)   traZODone (DESYREL) 100 MG tablet TAKE 1 TABLET BY MOUTH EVERYDAY AT BEDTIME   UNABLE TO FIND Med Name: TDAP vaccine   Vitamin D, Ergocalciferol, (DRISDOL) 1.25 MG (50000 UNIT) CAPS capsule TAKE 1 CAPSULE BY MOUTH ONE TIME PER WEEK   No facility-administered encounter medications on file as of 01/10/2023.    Past Surgical History:  Procedure Laterality Date   CYST EXCISION Right 03/03/2002   mandible   MULTIPLE TOOTH EXTRACTIONS  03/03/2002   #1, 16, 17, 30, 32   TOENAIL EXCISION  Bilateral    great toe   TOOTH EXTRACTION Right 09/03/2014   Procedure: EXTRACTION MOLAR LOWER RIGHT;  Surgeon: [place] Lopes, DDS;  Location: [facility];  Service: Oral Surgery;  Laterality: Right;   TOOTH EXTRACTION N/A 03/06/2018   Procedure: EXTRACTION X 1;  Surgeon: Ocie Doyne, DDS;  Location: [facility];  Service: Oral Surgery;  Laterality: N/A;   TOOTH EXTRACTION N/A 12/20/2020   Procedure: DENTAL RESTORATION/EXTRACTIONS TEETH THIRTEEN AND NINETEEN.;  Surgeon: Ocie Doyne, DMD;  Location: [facility];  Service: Oral Surgery;  Laterality: N/A;    Review of Systems  Constitutional:  Negative for chills and fever.  Eyes:  Negative for blurred vision.  Respiratory:  Negative for shortness of breath.   Cardiovascular:  Negative for chest pain.  Gastrointestinal:  Negative for abdominal pain.  Genitourinary:  Negative for dysuria.  Neurological:  Negative for dizziness and headaches.      Objective    BP 125/83   Pulse (!) 109   Ht 5\' 9"  (1.753 m)   Wt (!) 394 lb (178.7 kg)   SpO2 90%   BMI 58.18 kg/m   Physical Exam Vitals reviewed.  Constitutional:      General: He is not in acute distress.    Appearance: Normal appearance. He is not ill-appearing, toxic-appearing or diaphoretic.  HENT:  Head: Normocephalic.  Eyes:     General:        Right eye: No discharge.        Left eye: No discharge.     Conjunctiva/sclera: Conjunctivae normal.  Cardiovascular:     Rate and Rhythm: Normal rate.     Pulses: Normal pulses.     Heart sounds: Normal heart sounds.  Pulmonary:     Effort: Pulmonary effort is normal. No respiratory distress.     Breath sounds: Normal breath sounds.  Musculoskeletal:        General: Swelling and tenderness present. Normal range of motion.     Cervical back: Normal range of motion.     Right lower leg: Edema present.     Left lower leg: Edema present.  Skin:    General: Skin is warm and dry.     Capillary Refill: Capillary refill takes  less than 2 seconds.  Neurological:     General: No focal deficit present.     Mental Status: He is alert and oriented to person, place, and time.     Coordination: Coordination normal.     Gait: Gait normal.  Psychiatric:        Mood and Affect: Mood normal.        Behavior: Behavior normal.       Assessment & Plan:  Leg swelling Assessment & Plan: Chronic leg swelling worsening- unknown cause  Patient taking Lasix 20 mg twice daily, currently has on compression stocking DVT vs early PAD Ordered US Venous Doppler STAT- Awaiting results will treat accordingly.  Referral please to vascular surgery for further evaluation   Orders: -     Ambulatory referral to Vascular Surgery -     VAS [place] LOWER EXTREMITY VENOUS (DVT); Future    Return if symptoms worsen or fail to improve.   Cruzita Lederer Newman Nip, FNP

## 2023-01-10 NOTE — Assessment & Plan Note (Signed)
Chronic leg swelling worsening- unknown cause  Patient taking Lasix 20 mg twice daily, currently has on compression stocking DVT vs early PAD Ordered US Venous Doppler STAT- Awaiting results will treat accordingly.  Referral please to vascular surgery for further evaluation

## 2023-02-12 ENCOUNTER — Other Ambulatory Visit: Payer: Self-pay | Admitting: *Deleted

## 2023-02-12 DIAGNOSIS — M7989 Other specified soft tissue disorders: Secondary | ICD-10-CM

## 2023-02-28 ENCOUNTER — Encounter: Payer: Self-pay | Admitting: Vascular Surgery

## 2023-02-28 ENCOUNTER — Ambulatory Visit (HOSPITAL_COMMUNITY)
Admission: RE | Admit: 2023-02-28 | Discharge: 2023-02-28 | Disposition: A | Discharge status: Home / Self Care | Payer: 59 | Source: Ambulatory Visit | Attending: Physician Assistant | Admitting: Physician Assistant

## 2023-02-28 ENCOUNTER — Ambulatory Visit: Payer: MEDICAID | Admitting: Vascular Surgery

## 2023-02-28 VITALS — BP 123/85 | HR 100 | Temp 97.6°F | Resp 18 | Ht 71.0 in | Wt 390.0 lb

## 2023-02-28 DIAGNOSIS — M7989 Other specified soft tissue disorders: Secondary | ICD-10-CM | POA: Diagnosis not present

## 2023-02-28 DIAGNOSIS — I890 Lymphedema, not elsewhere classified: Secondary | ICD-10-CM

## 2023-02-28 NOTE — Progress Notes (Signed)
Patient ID: Scott Mendez, male   DOB: 1981/07/16, 42 y.o.   MRN: [id number]  Reason for Consult: No chief complaint on file.   Referred by Kerri Perches, MD  Subjective:     HPI:  Scott Mendez is a 42 y.o. male with history of right greater than left lower extremity swelling.  Patient has autism presents with his mother today.  He did have a wound on his right leg that is healed and this was healed at his last visit 2 and half years ago.  He is wearing knee-high compression stockings and she states that he also has lymphedema pumps at home.  Patient's weight has been stable with morbid obesity.  According to mom he does not elevate his legs is much as recommended.  Past Medical History:  Diagnosis Date   Autism    Hypertension    states under control with meds., has been on med. x 8 yr.   Morbid obesity with BMI of 45.0-49.9, adult (HCC)    Nonrestorable tooth 02/2018   x 2   Nonverbal    Sleep apnea    mild - no CPAP   Family History  Problem Relation Age of Onset   Hypertension Mother    Hypertension Father    Diabetes Father    Heart disease Maternal Grandfather    Diabetes Paternal Grandmother    Seizures Maternal Uncle    CVA Maternal Grandmother    Heart disease Paternal Grandfather    Past Surgical History:  Procedure Laterality Date   CYST EXCISION Right 03/03/2002   mandible   MULTIPLE TOOTH EXTRACTIONS  03/03/2002   #1, 16, 17, 30, 32   TOENAIL EXCISION Bilateral    great toe   TOOTH EXTRACTION Right 09/03/2014   Procedure: EXTRACTION MOLAR LOWER RIGHT;  Surgeon: Georgia Lopes, DDS;  Location: [facility] OR;  Service: Oral Surgery;  Laterality: Right;   TOOTH EXTRACTION N/A 03/06/2018   Procedure: EXTRACTION X 1;  Surgeon: Ocie Doyne, DDS;  Location: [facility];  Service: Oral Surgery;  Laterality: N/A;   TOOTH EXTRACTION N/A 12/20/2020   Procedure: DENTAL RESTORATION/EXTRACTIONS TEETH THIRTEEN AND NINETEEN.;  Surgeon: Ocie Doyne, DMD;  Location: [facility] OR;  Service: Oral Surgery;  Laterality: N/A;    Short Social History:  Social History   Tobacco Use   Smoking status: Never   Smokeless tobacco: Never  Substance Use Topics   Alcohol use: No    No Known Allergies  Current Outpatient Medications  Medication Sig Dispense Refill   acetaminophen (TYLENOL) 500 MG tablet Take 1,000 mg by mouth every 6 (six) hours as needed for moderate pain or headache.     furosemide (LASIX) 20 MG tablet TAKE 1 TABLET BY MOUTH TWICE A DAY 180 tablet 1   KLOR-CON M20 20 MEQ tablet TAKE 1 TABLET BY MOUTH TWICE A DAY 180 tablet 1   loratadine (CLARITIN) 10 MG tablet TAKE 1 TABLET BY MOUTH EVERY DAY 90 tablet 1   montelukast (SINGULAIR) 10 MG tablet TAKE 1 TABLET BY MOUTH EVERYDAY AT BEDTIME 90 tablet 1   olmesartan-hydrochlorothiazide (BENICAR HCT) 40-12.5 MG tablet TAKE 1 TABLET BY MOUTH EVERY DAY 90 tablet 1   selenium sulfide (SELSUN) 1 % LOTN Wash  Hair daily with 12 cc shampoo for 3 weeks , then twice weekly (Patient taking differently: Apply 1 application  topically 2 (two) times a week. Wash hair with 12 cc) 420 mL 3   traZODone (DESYREL)  100 MG tablet TAKE 1 TABLET BY MOUTH EVERYDAY AT BEDTIME 90 tablet 1   UNABLE TO FIND Med Name: TDAP vaccine 1 each 0   Vitamin D, Ergocalciferol, (DRISDOL) 1.25 MG (50000 UNIT) CAPS capsule TAKE 1 CAPSULE BY MOUTH ONE TIME PER WEEK 12 capsule 1   No current facility-administered medications for this visit.    REVIEW OF SYSTEMS  Unable to obtain secondary to patient limitations    Objective:  Objective   Vitals:   02/28/23 1006  BP: 123/85  Pulse: 100  Resp: 18  Temp: 97.6 F (36.4 C)  SpO2: 98%     Physical Exam Constitutional:      Appearance: He is obese.  HENT:     Nose: Nose normal.     Mouth/Throat:     Mouth: Mucous membranes are moist.  Eyes:     Pupils: Pupils are equal, round, and reactive to light.  Musculoskeletal:     Right lower leg: Edema present.      Left lower leg: Edema present.     Comments: Dark discolored skin right shin and ankle  Skin:    General: Skin is warm.     Capillary Refill: Capillary refill takes less than 2 seconds.  Neurological:     Mental Status: He is alert.     Data: Venous Reflux Times  +-------------+---------+------+---------+------------+--------------------  ---+  RIGHT       Reflux NoReflux Reflux  Diameter cmsComments                                         Yes    Time                                         +-------------+---------+------+---------+------------+--------------------  ---+  CFV                   yes  >1 second                                      +-------------+---------+------+---------+------------+--------------------  ---+  FV mid       no                                                            +-------------+---------+------+---------+------------+--------------------  ---+  Popliteal   no                                                            +-------------+---------+------+---------+------------+--------------------  ---+  GSV at [facility]                                       age indeterminate  thrombus                  +-------------+---------+------+---------+------------+--------------------  ---+  GSV prox                                         age indeterminate         thigh                                            thrombus                  +-------------+---------+------+---------+------------+--------------------  ---+  GSV mid thigh                                    age indeterminate                                                          thrombus                  +-------------+---------+------+---------+------------+--------------------  ---+  GSV dist                                         age  indeterminate         thigh                                            thrombus                  +-------------+---------+------+---------+------------+--------------------  ---+  GSV at knee                                      age indeterminate                                                          thrombus                  +-------------+---------+------+---------+------------+--------------------  ---+  GSV prox calf                                    age indeterminate                                                          thrombus                  +-------------+---------+------+---------+------------+--------------------  ---+  GSV mid calf                                     age indeterminate                                                          thrombus                  +-------------+---------+------+---------+------------+--------------------  ---+  GSV dist calf                            .171                              +-------------+---------+------+---------+------------+--------------------  ---+  SSV Pop Fossano                          .212                              +-------------+---------+------+---------+------------+--------------------  ---+         Summary:  Right:  - No evidence of superficial venous reflux seen in the right short  saphenous vein.  - Color duplex evaluation of the right lower extremity shows there is  thrombus in the saphero-femoral junction, proximal greater saphenous vein  and distal greater saphenous vein.      Assessment/Plan:    42 year old male with morbid obesity with lymphedema and healed wound on the right leg.  I recommended continued compression stockings and to use his lymphedema pumps at home as directed as well as leg elevation.  He also needs extreme weight loss.  He can see [place] on an as-needed basis although the GSV appears thrombosed on  today's evaluation and unlikely would be a candidate for any venous procedures in the future.     Maeola Harman MD [facility]

## 2023-03-19 ENCOUNTER — Encounter: Payer: Self-pay | Admitting: "Endocrinology

## 2023-03-19 ENCOUNTER — Ambulatory Visit (INDEPENDENT_AMBULATORY_CARE_PROVIDER_SITE_OTHER): Payer: 59 | Admitting: "Endocrinology

## 2023-03-19 ENCOUNTER — Other Ambulatory Visit: Payer: Self-pay | Admitting: Family Medicine

## 2023-03-19 DIAGNOSIS — I10 Essential (primary) hypertension: Secondary | ICD-10-CM | POA: Diagnosis not present

## 2023-03-19 DIAGNOSIS — E118 Type 2 diabetes mellitus with unspecified complications: Secondary | ICD-10-CM | POA: Insufficient documentation

## 2023-03-19 DIAGNOSIS — E039 Hypothyroidism, unspecified: Secondary | ICD-10-CM | POA: Insufficient documentation

## 2023-03-19 DIAGNOSIS — E1169 Type 2 diabetes mellitus with other specified complication: Secondary | ICD-10-CM

## 2023-03-19 DIAGNOSIS — E785 Hyperlipidemia, unspecified: Secondary | ICD-10-CM

## 2023-03-19 DIAGNOSIS — Z7984 Long term (current) use of oral hypoglycemic drugs: Secondary | ICD-10-CM | POA: Diagnosis not present

## 2023-03-19 LAB — POCT GLYCOSYLATED HEMOGLOBIN (HGB A1C): HbA1c, POC (controlled diabetic range): 6.2 % (ref 0.0–7.0)

## 2023-03-19 MED ORDER — EMPAGLIFLOZIN 10 MG PO TABS
10.0000 mg | ORAL_TABLET | Freq: Every day | ORAL | 1 refills | Status: DC
Start: 2023-03-19 — End: 2023-06-18

## 2023-03-19 MED ORDER — LEVOTHYROXINE SODIUM 88 MCG PO TABS: 88.0000 ug | ORAL_TABLET | Freq: Every day | ORAL | 3 refills | Status: DC

## 2023-03-19 NOTE — Patient Instructions (Signed)

## 2023-03-19 NOTE — Progress Notes (Signed)
Endocrinology Consult Note       03/19/2023, 5:56 PM   Subjective:    Patient ID: Scott Mendez, male    DOB: 07/23/1981.  Scott Mendez is being seen in consultation for management of currently uncontrolled symptomatic diabetes requested by  Kerri Perches, MD.   Past Medical History:  Diagnosis Date   Autism    Hypertension    states under control with meds., has been on med. x 8 yr.   Morbid obesity with BMI of 45.0-49.9, adult (HCC)    Nonrestorable tooth 02/2018   x 2   Nonverbal    Sleep apnea    mild - no CPAP    Past Surgical History:  Procedure Laterality Date   CYST EXCISION Right 03/03/2002   mandible   MULTIPLE TOOTH EXTRACTIONS  03/03/2002   #1, 16, 17, 30, 32   TOENAIL EXCISION Bilateral    great toe   TOOTH EXTRACTION Right 09/03/2014   Procedure: EXTRACTION MOLAR LOWER RIGHT;  Surgeon: Georgia Lopes, DDS;  Location: [facility] OR;  Service: Oral Surgery;  Laterality: Right;   TOOTH EXTRACTION N/A 03/06/2018   Procedure: EXTRACTION X 1;  Surgeon: Ocie Doyne, DDS;  Location: [facility];  Service: Oral Surgery;  Laterality: N/A;   TOOTH EXTRACTION N/A 12/20/2020   Procedure: DENTAL RESTORATION/EXTRACTIONS TEETH THIRTEEN AND NINETEEN.;  Surgeon: Ocie Doyne, DMD;  Location: [facility] OR;  Service: Oral Surgery;  Laterality: N/A;    Social History   Socioeconomic History   Marital status: Single    Spouse name: N/A   Number of children: 0   Years of education: Not on file   Highest education level: Not on file  Occupational History   Not on file  Tobacco Use   Smoking status: Never   Smokeless tobacco: Never  Vaping Use   Vaping status: Never Used  Substance and Sexual Activity   Alcohol use: No   Drug use: No   Sexual activity: Never  Other Topics Concern   Not on file  Social History Narrative   Lives with Mother and sister    Social  Determinants of Health   Financial Resource Strain: Low Risk  (06/23/2020)   Overall Financial Resource Strain (CARDIA)    Difficulty of Paying Living Expenses: Not hard at all  Food Insecurity: No Food Insecurity (06/23/2020)   Hunger Vital Sign    Worried About Running Out of Food in the Last Year: Never true    Ran Out of Food in the Last Year: Never true  Transportation Needs: No Transportation Needs (06/23/2020)   PRAPARE - Administrator, Civil Service (Medical): No    Lack of Transportation (Non-Medical): No  Physical Activity: Insufficiently Active (06/23/2020)   Exercise Vital Sign    Days of Exercise per Week: 4 days    Minutes of Exercise per Session: 30 min  Stress: No Stress Concern Present (06/23/2020)   [employer] of Occupational Health - Occupational Stress Questionnaire    Feeling of Stress : Not at all  Social Connections: Socially Isolated (06/23/2020)   Social Connection and Isolation  Panel [NHANES]    Frequency of Communication with Friends and Family: Never    Frequency of Social Gatherings with Friends and Family: Twice a week    Attends Religious Services: More than 4 times per year    Active Member of [employer] or Organizations: No    Attends Engineer, structural: Never    Marital Status: Never married    Family History  Problem Relation Age of Onset   Hypertension Mother    Hypertension Father    Diabetes Father    Heart disease Maternal Grandfather    Diabetes Paternal Grandmother    Seizures Maternal Uncle    CVA Maternal Grandmother    Heart disease Paternal Grandfather     Outpatient Encounter Medications as of 03/19/2023  Medication Sig   empagliflozin (JARDIANCE) 10 MG TABS tablet Take 1 tablet (10 mg total) by mouth daily before breakfast.   levothyroxine (SYNTHROID) 88 MCG tablet Take 1 tablet (88 mcg total) by mouth daily before breakfast.   acetaminophen (TYLENOL) 500 MG tablet Take 1,000 mg by mouth every 6 (six)  hours as needed for moderate pain or headache.   furosemide (LASIX) 20 MG tablet TAKE 1 TABLET BY MOUTH TWICE A DAY   KLOR-CON M20 20 MEQ tablet TAKE 1 TABLET BY MOUTH TWICE A DAY   loratadine (CLARITIN) 10 MG tablet TAKE 1 TABLET BY MOUTH EVERY DAY   montelukast (SINGULAIR) 10 MG tablet TAKE 1 TABLET BY MOUTH EVERYDAY AT BEDTIME   olmesartan-hydrochlorothiazide (BENICAR HCT) 40-12.5 MG tablet TAKE 1 TABLET BY MOUTH EVERY DAY   selenium sulfide (SELSUN) 1 % LOTN Wash  Hair daily with 12 cc shampoo for 3 weeks , then twice weekly (Patient taking differently: Apply 1 application  topically 2 (two) times a week. Wash hair with 12 cc)   traZODone (DESYREL) 100 MG tablet TAKE 1 TABLET BY MOUTH EVERYDAY AT BEDTIME (Patient taking differently: at bedtime as needed.)   UNABLE TO FIND Med Name: TDAP vaccine   Vitamin D, Ergocalciferol, (DRISDOL) 1.25 MG (50000 UNIT) CAPS capsule TAKE 1 CAPSULE BY MOUTH ONE TIME PER WEEK   No facility-administered encounter medications on file as of 03/19/2023.    ALLERGIES: No Known Allergies  VACCINATION STATUS: Immunization History  Administered Date(s) Administered   H1N1 06/03/2008   Influenza Split 04/22/2012   Influenza Whole 05/07/2007, 05/02/2009, 04/17/2010   Influenza,inj,Quad PF,6+ Mos 05/18/2013, 07/20/2014, 04/04/2015, 04/04/2016, 04/10/2017, 05/05/2018, 04/20/2019, 04/13/2020, 05/11/2021, 04/05/2022   Moderna Sars-Covid-2 Vaccination 12/07/2019, 01/06/2020   Td 11/23/2002   Tdap 12/19/2011    Diabetes He presents for his initial diabetic visit. He has type 2 diabetes mellitus. Onset time: No diagnosis at age 42. His disease course has been fluctuating. Hypoglycemia symptoms include confusion. Pertinent negatives for hypoglycemia include no headaches, pallor or seizures. There are no diabetic associated symptoms. Pertinent negatives for diabetes include no chest pain, no fatigue, no polydipsia, no polyphagia, no polyuria and no weakness. There are no  hypoglycemic complications. Symptoms are stable. (His type 2 diabetes is complicated by developmental deficit with autism, hyperlipidemia, hypertension, obesity.) Risk factors for coronary artery disease include family history, dyslipidemia, hypertension, male sex, obesity and sedentary lifestyle. When asked about current treatments, none were reported. His weight is decreasing steadily. He is following a generally unhealthy diet. When asked about meal planning, he reported none. He has not had a previous visit with a dietitian. He never participates in exercise. (He did not bring any logs nor meter with him.  His  recent A1c of 6.6%, point-of-care A1c today 6.2%.) An ACE inhibitor/angiotensin II receptor blocker is being taken.  Hyperlipidemia This is a chronic problem. The current episode started more than 1 year ago. The problem is uncontrolled. Exacerbating diseases include diabetes, hypothyroidism and obesity. Pertinent negatives include no chest pain, myalgias or shortness of breath. He is currently on no antihyperlipidemic treatment. Risk factors for coronary artery disease include diabetes mellitus, dyslipidemia, hypertension, male sex, a sedentary lifestyle, family history and obesity.  Hypertension This is a chronic problem. The current episode started more than 1 year ago. Pertinent negatives include no chest pain, headaches, neck pain, palpitations or shortness of breath. Risk factors for coronary artery disease include dyslipidemia, family history, diabetes mellitus, male gender, obesity and sedentary lifestyle. Past treatments include angiotensin blockers.     Review of Systems  Constitutional:  Negative for chills, fatigue, fever and unexpected weight change.  HENT:  Negative for dental problem, mouth sores and trouble swallowing.   Eyes:  Negative for visual disturbance.  Respiratory:  Negative for cough, choking, chest tightness, shortness of breath and wheezing.   Cardiovascular:   Negative for chest pain, palpitations and leg swelling.  Gastrointestinal:  Negative for abdominal distention, abdominal pain, constipation, diarrhea, nausea and vomiting.  Endocrine: Negative for polydipsia, polyphagia and polyuria.  Genitourinary:  Negative for dysuria, flank pain, hematuria and urgency.  Musculoskeletal:  Negative for back pain, gait problem, myalgias and neck pain.  Skin:  Negative for pallor, rash and wound.  Neurological:  Negative for seizures, syncope, weakness, numbness and headaches.  Psychiatric/Behavioral:  Positive for agitation and confusion. Negative for dysphoric mood.        Patient with autistic disorder, nonverbal.    Objective:       03/19/2023    2:40 PM 02/28/2023   10:06 AM 01/10/2023    3:37 PM  Vitals with BMI  Height 5\' 11"  5\' 11"  5\' 9"   Weight 293 lbs 390 lbs 394 lbs  BMI 40.88 54.42 58.16  Systolic 130 123 536  Diastolic 86 85 83  Pulse 68 100 109    BP 130/86   Pulse 68   Ht 5\' 11"  (1.803 m)   Wt 293 lb (132.9 kg)   BMI 40.87 kg/m   Wt Readings from Last 3 Encounters:  03/19/23 293 lb (132.9 kg)  02/28/23 (!) 390 lb (176.9 kg)  01/10/23 (!) 394 lb (178.7 kg)     Physical Exam Constitutional:      General: He is not in acute distress.    Appearance: He is well-developed.     Comments: Nonverbal.  Being assisted by his mother.   HENT:     Head: Normocephalic and atraumatic.  Neck:     Thyroid: No thyromegaly.     Trachea: No tracheal deviation.  Cardiovascular:     Rate and Rhythm: Normal rate.     Pulses:          Dorsalis pedis pulses are 1+ on the right side and 1+ on the left side.       Posterior tibial pulses are 1+ on the right side and 1+ on the left side.     Heart sounds: Normal heart sounds, S1 normal and S2 normal. No murmur heard.    No gallop.  Pulmonary:     Effort: No respiratory distress.     Breath sounds: Normal breath sounds. No wheezing.  Abdominal:     General: Bowel sounds are normal. There is  no distension.  Palpations: Abdomen is soft.     Tenderness: There is no abdominal tenderness. There is no guarding.     Comments: Obese.  Musculoskeletal:     Right shoulder: No swelling or deformity.     Cervical back: Normal range of motion and neck supple.  Skin:    General: Skin is warm and dry.     Findings: No rash.     Nails: There is no clubbing.     Comments: Acanthosis nigricans.  Neurological:     Mental Status: He is alert and oriented to person, place, and time.     Cranial Nerves: No cranial nerve deficit.     Sensory: No sensory deficit.     Gait: Gait normal.     Deep Tendon Reflexes: Reflexes are normal and symmetric.  Psychiatric:        Speech: Speech normal.        Behavior: Behavior normal. Behavior is cooperative.        Thought Content: Thought content normal.        Judgment: Judgment normal.       CMP ( most recent) CMP     Component Value Date/Time   NA 140 12/13/2022 1617   K 3.9 12/13/2022 1617   CL 99 12/13/2022 1617   CO2 24 12/13/2022 1617   GLUCOSE 126 (H) 12/13/2022 1617   GLUCOSE 88 11/04/2019 0841   BUN 17 12/13/2022 1617   CREATININE 0.93 12/13/2022 1617   CREATININE 1.01 11/04/2019 0841   CALCIUM 9.2 12/13/2022 1617   PROT 6.5 12/13/2022 1617   ALBUMIN 4.2 12/13/2022 1617   AST 22 12/13/2022 1617   ALT 30 12/13/2022 1617   ALKPHOS 108 12/13/2022 1617   BILITOT 0.4 12/13/2022 1617   EGFR 107 12/13/2022 1617   GFRNONAA 95 11/04/2019 0841     Diabetic Labs (most recent): Lab Results  Component Value Date   HGBA1C 6.2 03/19/2023   HGBA1C 6.6 (H) 12/13/2022   HGBA1C 6.2 (H) 09/14/2022     Lipid Panel ( most recent) Lipid Panel     Component Value Date/Time   CHOL 140 12/13/2022 1617   TRIG 189 (H) 12/13/2022 1617   HDL 37 (L) 12/13/2022 1617   CHOLHDL 3.8 12/13/2022 1617   CHOLHDL 3.8 11/04/2019 0841   VLDL 19 09/17/2016 0834   LDLCALC 71 12/13/2022 1617   LDLCALC 92 11/04/2019 0841   LABVLDL 32 12/13/2022  1617      Lab Results  Component Value Date   TSH 5.460 (H) 12/13/2022   TSH 3.110 04/13/2022   TSH 2.630 11/07/2020   TSH 1.81 11/04/2019   TSH 2.59 12/31/2018   TSH 1.85 09/09/2017   TSH 2.43 09/17/2016   TSH 1.00 03/23/2016   TSH 1.954 03/14/2015   TSH 1.326 03/05/2014   FREET4 1.25 12/13/2022       Assessment & Plan:   1. Type 2 diabetes mellitus with morbid obesity (HCC)  - Shermon L Emond has currently uncontrolled symptomatic type 2 DM since  42 years of age. He did not bring any logs nor meter with him.  His recent A1c of 6.6%, point-of-care A1c today 6.2%. Recent labs reviewed. -Patient is autistic, nonverbal.  His care plan was discussed with his mother.  -his diabetes is complicated by comorbid hyperlipidemia, hypertension, morbid obesity, developmental deficit and he remains at a high risk for more acute and chronic complications which include CAD, CVA, CKD, retinopathy, and neuropathy. These are all discussed in detail with  him.  - I discussed all available options of managing his diabetes including de-escalation of medications. I have counseled him on Food as Medicine by adopting a Whole Food , Plant Predominant  ( WFPP) nutrition as recommended by [employer] of Lifestyle Medicine. Patient is encouraged to switch to  unprocessed or minimally processed  complex starch, adequate protein intake (mainly plant source), minimal liquid fat, plenty of fruits, and vegetables. -  he is advised to stick to a routine mealtimes to eat 3 complete meals a day and snack only when necessary ( to snack only to correct hypoglycemia BG <70 day time or <100 at night).  - he will be scheduled with Norm Salt, RDN, CDE for individualized diabetes education.  - I have approached him with the following individualized plan to manage  his diabetes and patient agrees:   -Even though his point-of-care A1c is 6.2%, improving from 6.6%, this patient will benefit from pharmaceutical  agents which will help him keep diabetes under control and help him achieve some weight loss.  The family is hesitant to go on injectable options.  I discussed and prescribed Jardiance 10 mg p.o. daily at breakfast . Side effects and precautions discussed with the family. He would have benefited more from GLP-1 receptor agonist, however reportedly he gets anxious with injections. - Specific targets for  A1c;  LDL, HDL,  and Triglycerides were discussed with the patient.  2) Blood Pressure /Hypertension:  his blood pressure is  controlled to target.   he is advised to continue his current medications including Benicar HCT 40-12.5 mg p.o. daily with breakfast .  3) Lipids/Hyperlipidemia:   Review of his recent lipid panel showed  uncontrolled  LDL at 92 .  -He will not be started on statins for now.  He will be treated for hypothyroidism-see below.   4)  Weight/Diet:  Body mass index is 40.87 kg/m.  -   clearly complicating his diabetes care.   he is  a candidate for weight loss.  A single most important intervention for this patient would be weight loss.  If he does not respond to SGLT2 inhibitors , he will be considered for GLP-1 receptor agonist.  I discussed with him the fact that loss of 5 - 10% of his  current body weight will have the most impact on his diabetes management.  The above detailed  ACLM recommendations for nutrition, exercise, sleep, social life, avoidance of risky substances, the need for restorative sleep   information will also detailed on discharge instructions.   5) hypothyroidism: New diagnosis.  He will benefit from early initiation of levothyroxine.  I discussed initiated levothyroxine 25 mg p.o. daily before breakfast.   - We discussed about the correct intake of his thyroid hormone, on empty stomach at fasting, with water, separated by at least 30 minutes from breakfast and other medications,  and separated by more than 4 hours from calcium, iron, multivitamins, acid  reflux medications (PPIs). -Patient is made aware of the fact that thyroid hormone replacement is needed for life, dose to be adjusted by periodic monitoring of thyroid function tests.  6) Chronic Care/Health Maintenance:  -he  is on ARB medications and  is encouraged to initiate and continue to follow up with Ophthalmology, Dentist,  Podiatrist at least yearly or according to recommendations, and advised to   stay away from smoking. I have recommended yearly flu vaccine and pneumonia vaccine at least every 5 years; moderate intensity exercise for up to 150  minutes weekly; and  sleep for 7- 9 hours a day.  - he is  advised to maintain close follow up with Kerri Perches, MD for primary care needs, as well as his other providers for optimal and coordinated care.   Thank you for involving me in the care of this pleasant patient.  I spent  63  minutes in the care of the patient today including review of labs from CMP, Lipids, Thyroid Function, Hematology (current and previous including abstractions from other facilities); face-to-face time discussing  his blood glucose readings/logs, discussing hypoglycemia and hyperglycemia episodes and symptoms, medications doses, his options of short and long term treatment based on the latest standards of care / guidelines;  discussion about incorporating lifestyle medicine;  and documenting the encounter. Risk reduction counseling performed per USPSTF guidelines to reduce  obesity and cardiovascular risk factors.      Please refer to Patient Instructions for Blood Glucose Monitoring and Insulin/Medications Dosing Guide"  in media tab for additional information. Please  also refer to " Patient Self Inventory" in the Media  tab for reviewed elements of pertinent patient history.  Kourosh L Maguire participated in the discussions, expressed understanding, and voiced agreement with the above plans.  All questions were answered to his satisfaction. he is  encouraged to contact clinic should he have any questions or concerns prior to his return visit.   Follow up plan: - Return in about 3 months (around 06/19/2023) for Fasting Labs  in AM B4 8, A1c -NV.  Marquis Lunch, MD [facility] [address] [place], [place] [address] Phone: [phone]  Fax: [phone]    03/19/2023, 5:56 PM  This note was partially dictated with voice recognition software. Similar sounding words can be transcribed inadequately or may not  be corrected upon review.

## 2023-03-20 ENCOUNTER — Other Ambulatory Visit: Payer: Self-pay | Admitting: "Endocrinology

## 2023-03-20 ENCOUNTER — Telehealth: Payer: Self-pay

## 2023-03-20 ENCOUNTER — Other Ambulatory Visit: Payer: Self-pay | Admitting: Family Medicine

## 2023-03-20 MED ORDER — BLOOD GLUCOSE MONITOR KIT: 1.0000 | PACK | Freq: Two times a day (BID) | 0 refills | Status: DC

## 2023-03-20 MED ORDER — GLUCOSE BLOOD VI STRP
ORAL_STRIP | 2 refills | Status: DC
Start: 2023-03-20 — End: 2023-10-17

## 2023-03-20 MED ORDER — LANCETS MISC
2 refills | Status: DC
Start: 2023-03-20 — End: 2023-10-17

## 2023-03-20 NOTE — Telephone Encounter (Signed)
Pt's mother called asking how often pt needs to check his glucose.

## 2023-03-20 NOTE — Telephone Encounter (Signed)
Spoke with pt's mother advising her to check pt's blood glucose before breakfast and before bedtime. Rx's for glucose monitor, test strips and lancets sent to pharmacy.

## 2023-03-25 ENCOUNTER — Other Ambulatory Visit: Payer: Self-pay | Admitting: Family Medicine

## 2023-03-25 ENCOUNTER — Other Ambulatory Visit: Payer: Self-pay | Admitting: "Endocrinology

## 2023-03-25 DIAGNOSIS — I10 Essential (primary) hypertension: Secondary | ICD-10-CM

## 2023-04-09 ENCOUNTER — Encounter: Payer: 59 | Attending: "Endocrinology | Admitting: Nutrition

## 2023-04-09 ENCOUNTER — Encounter: Payer: Self-pay | Admitting: Nutrition

## 2023-04-09 DIAGNOSIS — Z6841 Body Mass Index (BMI) 40.0 and over, adult: Secondary | ICD-10-CM | POA: Insufficient documentation

## 2023-04-09 DIAGNOSIS — E1169 Type 2 diabetes mellitus with other specified complication: Secondary | ICD-10-CM | POA: Insufficient documentation

## 2023-04-09 DIAGNOSIS — Z713 Dietary counseling and surveillance: Secondary | ICD-10-CM | POA: Insufficient documentation

## 2023-04-09 DIAGNOSIS — I10 Essential (primary) hypertension: Secondary | ICD-10-CM

## 2023-04-09 NOTE — Patient Instructions (Signed)
Goals  Get rid of junk food of sweets, cakes and desserts, chips Talk to PCP about his trazodone and see if he can come off of it and see if it will help him stay up later, so he can get better sleep schedule. Increase fruits and vegetables. Keep drinking water Test blood sugars once a day; am or bedtime rotate. Walk 15-30 minutes a day Lose 1-2 lbs per week.

## 2023-04-09 NOTE — Progress Notes (Signed)
Medical Nutrition Therapy  Appointment Start time:  1300  Appointment End time:  1400  Primary concerns today: DM Type 2, severe obesity Referral diagnosis: E11.8, E66.01 Preferred learning style:  Mom: no preference  Learning readiness: Ready    NUTRITION ASSESSMENT  42 yr old bmale referred for Type 2 DM and severe obesity- BMI 53. Here with his mom. He is non verbal. PMH: HTN, Obesity, DM Type 2, Hypothryoidism, Autism.   Eats 3 meals per day. LIves with his mom. She does all the meal cookig. [facility] 4 [facility]. Testing blood sugars a few times per week by his mom. FBS 160's. Night time 140's on occasstion.  Had been sneaking a lot of food at night and mom has locked the cabinets so he can't get certain foods. He tends to go to bed everynight at 5 pm and then gets up at 1:30. He is on trazadone to help him sleep. Mom wants to see if he can come off of it and see if he will sleep more normal hours. Advised to talk to PCP about that next visit.  He has lost about 8 lbs since July. Error weight August listed at 293 lbs, should have been 393 lbs per his mother.  He doesn't like salads. Drinks mostly water. His mom says they do have junk food in the house for grandkids, but she is willing to remove them from the house and provider healthier food choices for everyone.  His mom is willing to start walking with him some daily and work in better portions.     Anthropometrics  Wt Readings from Last 3 Encounters:  04/09/23 (!) 382 lb (173.3 kg)  03/19/23 293 lb (132.9 kg)  02/28/23 (!) 390 lb (176.9 kg)   Ht Readings from Last 3 Encounters:  04/09/23 5\' 11"  (1.803 m)  03/19/23 5\' 11"  (1.803 m)  02/28/23 5\' 11"  (1.803 m)   Body mass index is 53.28 kg/m. @BMIFA @ Facility age limit for growth %iles is 20 years. Facility age limit for growth %iles is 20 years.    Clinical Medical Hx: See chart Medications: Jardiance See chart Labs:  Lab Results   Component Value Date   HGBA1C 6.2 03/19/2023      Latest Ref Rng & Units 12/13/2022    4:17 PM 09/14/2022    3:27 PM 04/13/2022    3:37 PM  CMP  Glucose 70 - 99 mg/dL 409  811  914   BUN 6 - 20 mg/dL 17  20  14    Creatinine 0.76 - 1.27 mg/dL 7.82  9.56  2.13   Sodium 134 - 144 mmol/L 140  141  141   Potassium 3.5 - 5.2 mmol/L 3.9  4.2  4.1   Chloride 96 - 106 mmol/L 99  102  100   CO2 20 - 29 mmol/L 24  23  24    Calcium 8.7 - 10.2 mg/dL 9.2  9.1  9.1   Total Protein 6.0 - 8.5 g/dL 6.5     Total Bilirubin 0.0 - 1.2 mg/dL 0.4     Alkaline Phos 44 - 121 IU/L 108     AST 0 - 40 IU/L 22     ALT 0 - 44 IU/L 30      Lipid Panel     Component Value Date/Time   CHOL 140 12/13/2022 1617   TRIG 189 (H) 12/13/2022 1617   HDL 37 (L) 12/13/2022 1617   CHOLHDL 3.8  12/13/2022 1617   CHOLHDL 3.8 11/04/2019 0841   VLDL 19 09/17/2016 0834   LDLCALC 71 12/13/2022 1617   LDLCALC 92 11/04/2019 0841   LABVLDL 32 12/13/2022 1617     Notable Signs/Symptoms: None  Lifestyle & Dietary Hx LIves with mother now in [place]  Estimated daily fluid intake: 40 oz Supplements:  Sleep: goes to bed 5 pm and gets up at 1:30 am Stress / self-care:  \ Current average weekly physical activity: ADL  24-Hr Dietary Recall First Meal: Cherrios honey nut, water Snack:  Second Meal: [place] and cheese wheat bread, water Snack: fruit Third Meal: Chicken rotisserie, rice, butter beans, water Snack:  Beverages: water  Estimated Energy Needs Calories: 1800-2000 Carbohydrate: 225g Protein: 150g Fat: 56g   NUTRITION DIAGNOSIS  NI-1.7 Predicted excessive energy intake As related to high calorie diet and excessive food intake.  As evidenced by A1C 6.6% and BMI 53.Marland Kitchen   NUTRITION INTERVENTION  Nutrition education (E-1) on the following topics:  Nutrition and Diabetes education provided on My Plate, CHO counting, meal planning, portion sizes, timing of meals, avoiding snacks between meals unless having a  low blood sugar, target ranges for A1C and blood sugars, signs/symptoms and treatment of hyper/hypoglycemia, monitoring blood sugars, taking medications as prescribed, benefits of exercising 30 minutes per day and prevention of complications of DM.    Handouts Provided Include  My Plate Diabetes Instructions.  Learning Style & Readiness for Change Teaching method utilized: Visual & Auditory  Demonstrated degree of understanding via: Teach Back  Barriers to learning/adherence to lifestyle change: Mother- none  Goals Established by Pt Goals  Get rid of junk food of sweets, cakes and desserts, chips Talk to PCP about his trazodone and see if he can come off of it and see if it will help him stay up later, so he can get better sleep schedule. Increase fruits and vegetables. Keep drinking water Test blood sugars once a day; am or bedtime rotate. Walk 15-30 minutes a day Lose 1-2 lbs per week.   MONITORING & EVALUATION Dietary intake, weekly physical activity, and BS and weight in 3 months.  Next Steps  Patient is to work on eating more foods from a garden and cut out processed foods.Marland Kitchen

## 2023-04-16 ENCOUNTER — Ambulatory Visit: Payer: 59

## 2023-04-16 ENCOUNTER — Encounter: Payer: Self-pay | Admitting: Family Medicine

## 2023-04-16 ENCOUNTER — Ambulatory Visit (INDEPENDENT_AMBULATORY_CARE_PROVIDER_SITE_OTHER): Payer: 59 | Admitting: Family Medicine

## 2023-04-16 VITALS — BP 128/86 | HR 85 | Ht 69.0 in | Wt 381.1 lb

## 2023-04-16 DIAGNOSIS — I10 Essential (primary) hypertension: Secondary | ICD-10-CM | POA: Diagnosis not present

## 2023-04-16 DIAGNOSIS — E1159 Type 2 diabetes mellitus with other circulatory complications: Secondary | ICD-10-CM | POA: Diagnosis not present

## 2023-04-16 DIAGNOSIS — E1169 Type 2 diabetes mellitus with other specified complication: Secondary | ICD-10-CM | POA: Diagnosis not present

## 2023-04-16 DIAGNOSIS — Z23 Encounter for immunization: Secondary | ICD-10-CM | POA: Diagnosis not present

## 2023-04-16 DIAGNOSIS — F5104 Psychophysiologic insomnia: Secondary | ICD-10-CM

## 2023-04-16 DIAGNOSIS — L210 Seborrhea capitis: Secondary | ICD-10-CM

## 2023-04-16 NOTE — Patient Instructions (Addendum)
F/U in early January, call if you need me sooner  Diabetic retinal screen in office today  Urine ACR today  Reduce trazodone to half tab at bedtime for 1 week, then half tab at bedtime for 3 days the following week, then stop.  If he has problems with sleep once you have stopped the trazodone then call , otherwise he will not continue this medication anymore  Foot exam done today  Keep up good work with weight loss and healthy food choice  Need TdAp and covid vaccines,both are at your pharmacy  Thanks for choosing [facility], we consider it a privelige to serve you.

## 2023-04-17 LAB — MICROALBUMIN / CREATININE URINE RATIO
Creatinine, Urine: 132.3 mg/dL
Microalb/Creat Ratio: 7 mg/g{creat} (ref 0–29)
Microalbumin, Urine: 9.2 ug/mL

## 2023-04-18 ENCOUNTER — Encounter: Payer: Self-pay | Admitting: Family Medicine

## 2023-04-18 NOTE — Progress Notes (Signed)
Scott Mendez     MRN: [id number]      DOB: 1980-11-25  Chief Complaint  Patient presents with   Leg Swelling    F/u causing pain: foot exam today    Weight Loss    F/u Needs Diabetic Kidney evaluation needed. Also, needs ophthalmology exam scheduled.    Medication Refill    Questions about Trazadone   Immunizations    Consent to flu shot today,.     HPI [name] is here for follow up and re-evaluation of chronic medical conditions, medication management and review of any available recent lab and radiology data.  Preventive health is updated, specifically  Cancer screening and Immunization.   Questions or concerns regarding consultations or procedures which the PT has had in the interim are  addressed.Doing very well since starting with Endo, new dx of diabetes and being treated for hypothyroidism Wants rial of discontinuing trazodone per rec of endo ROS History from Mother, autistic Denies recent fever or chills. Denies sinus pressure, nasal congestion, . Denies chest congestion, productive cough or wheezing. Denies  leg swelling Denies abdominal pain, nausea, vomiting,diarrhea or constipation.   Denies  incontinence. Denies joint pain, swelling and limitation in mobility. Denies headaches, seizures, numbness, or tingling. Denies depression, anxiety or insomnia. Denies skin break down or rash.   PE  BP 128/86 (BP Location: Right Arm, Patient Position: Sitting, Cuff Size: Large)   Pulse 85   Ht 5\' 9"  (1.753 m)   Wt (!) 381 lb 1.9 oz (172.9 kg)   SpO2 93%   BMI 56.28 kg/m   Patient alert and in no cardiopulmonary distress.  HEENT: No facial asymmetry, EOMI,     Neck supple .  Chest: Clear to auscultation bilaterally.  CVS: S1, S2 no murmurs, no S3.Regular rate.  ABD: Soft non tender.   Ext: No edema  MS: Adequate ROM spine, shoulders, hips and knees.  Skin: Intact, no ulcerations or rash noted.  Psych:  not anxious or depressed  appearing.  CNS: CN 2-12 intact, power,  normal throughout.no focal deficits noted.   Assessment & Plan  Essential hypertension, benign Controlled, no change in medication DASH diet and commitment to daily physical activity for a minimum of 30 minutes discussed and encouraged, as a part of hypertension management. The importance of attaining a healthy weight is also discussed.     04/16/2023    2:42 PM 04/09/2023    1:11 PM 03/19/2023    2:40 PM 02/28/2023   10:06 AM 01/10/2023    3:37 PM 12/13/2022    3:20 PM 12/13/2022    3:19 PM  BP/Weight  Systolic BP 128  784 123 125 139 144  Diastolic BP 86  86 85 83 86 89  Wt. (Lbs) 381.12 382 293 390 394  396.08  BMI 56.28 kg/m2 53.28 kg/m2 40.87 kg/m2 54.39 kg/m2 58.18 kg/m2  55.24 kg/m2       Morbid obesity Improved  Patient re-educated about  the importance of commitment to a  minimum of 150 minutes of exercise per week as able.  The importance of healthy food choices with portion control discussed, as well as eating regularly and within a 12 hour window most days. The need to choose "clean , green" food 50 to 75% of the time is discussed, as well as to make water the primary drink and set a goal of 64 ounces water daily.       04/16/2023    2:42 PM 04/09/2023  1:11 PM 03/19/2023    2:40 PM  Weight /BMI  Weight 381 lb 1.9 oz 382 lb 293 lb  Height 5\' 9"  (1.753 m) 5\' 11"  (1.803 m) 5\' 11"  (1.803 m)  BMI 56.28 kg/m2 53.28 kg/m2 40.87 kg/m2      Type 2 diabetes mellitus with morbid obesity [facility]) [name] is reminded of the importance of commitment to daily physical activity for 30 minutes or more, as able and the need to limit carbohydrate intake to 30 to 60 grams per meal to help with blood sugar control.   The need to take medication as prescribed, test blood sugar as directed, and to call between visits if there is a concern that blood sugar is uncontrolled is also discussed.   [name] is reminded of the importance  of daily foot exam, annual eye examination, and good blood sugar, blood pressure and cholesterol control.     Latest Ref Rng & Units 04/16/2023    3:49 PM 03/19/2023    4:33 PM 12/13/2022    4:17 PM 09/14/2022    3:27 PM 04/13/2022    3:37 PM  Diabetic Labs  HbA1c 0.0 - 7.0 %  6.2  6.6  6.2  5.9   Micro/Creat Ratio 0 - 29 mg/g creat 7       Chol 100 - 199 mg/dL   244     HDL >01 mg/dL   37     Calc LDL 0 - 99 mg/dL   71     Triglycerides 0 - 149 mg/dL   027     Creatinine 2.53 - 1.27 mg/dL   6.64  4.03  4.74       04/16/2023    2:42 PM 04/09/2023    1:11 PM 03/19/2023    2:40 PM 02/28/2023   10:06 AM 01/10/2023    3:37 PM 12/13/2022    3:20 PM 12/13/2022    3:19 PM  BP/Weight  Systolic BP 128  259 123 125 139 144  Diastolic BP 86  86 85 83 86 89  Wt. 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

## 2023-04-18 NOTE — Assessment & Plan Note (Signed)
Controlled, no change in medication DASH diet and commitment to daily physical activity for a minimum of 30 minutes discussed and encouraged, as a part of hypertension management. The importance of attaining a healthy weight is also discussed.     04/16/2023    2:42 PM 04/09/2023    1:11 PM 03/19/2023    2:40 PM 02/28/2023   10:06 AM 01/10/2023    3:37 PM 12/13/2022    3:20 PM 12/13/2022    3:19 PM  BP/Weight  Systolic BP 128  161 123 125 139 144  Diastolic BP 86  86 85 83 86 89  Wt. (Lbs) 381.12 382 293 390 394  396.08  BMI 56.28 kg/m2 53.28 kg/m2 40.87 kg/m2 54.39 kg/m2 58.18 kg/m2  55.24 kg/m2

## 2023-04-18 NOTE — Assessment & Plan Note (Signed)
Scott Mendez is reminded of the importance of commitment to daily physical activity for 30 minutes or more, as able and the need to limit carbohydrate intake to 30 to 60 grams per meal to help with blood sugar control.   The need to take medication as prescribed, test blood sugar as directed, and to call between visits if there is a concern that blood sugar is uncontrolled is also discussed.   Scott Mendez is reminded of the importance of daily foot exam, annual eye examination, and good blood sugar, blood pressure and cholesterol control.     Latest Ref Rng & Units 04/16/2023    3:49 PM 03/19/2023    4:33 PM 12/13/2022    4:17 PM 09/14/2022    3:27 PM 04/13/2022    3:37 PM  Diabetic Labs  HbA1c 0.0 - 7.0 %  6.2  6.6  6.2  5.9   Micro/Creat Ratio 0 - 29 mg/g creat 7       Chol 100 - 199 mg/dL   161     HDL >09 mg/dL   37     Calc LDL 0 - 99 mg/dL   71     Triglycerides 0 - 149 mg/dL   604     Creatinine 5.40 - 1.27 mg/dL   9.81  1.91  4.78       04/16/2023    2:42 PM 04/09/2023    1:11 PM 03/19/2023    2:40 PM 02/28/2023   10:06 AM 01/10/2023    3:37 PM 12/13/2022    3:20 PM 12/13/2022    3:19 PM  BP/Weight  Systolic BP 128  295 123 125 139 144  Diastolic BP 86  86 85 83 86 89  Wt. (Lbs) 381.12 382 293 390 394  396.08  BMI 56.28 kg/m2 53.28 kg/m2 40.87 kg/m2 54.39 kg/m2 58.18 kg/m2  55.24 kg/m2       No data to display

## 2023-04-18 NOTE — Assessment & Plan Note (Signed)
Improved  Patient re-educated about  the importance of commitment to a  minimum of 150 minutes of exercise per week as able.  The importance of healthy food choices with portion control discussed, as well as eating regularly and within a 12 hour window most days. The need to choose "clean , green" food 50 to 75% of the time is discussed, as well as to make water the primary drink and set a goal of 64 ounces water daily.       04/16/2023    2:42 PM 04/09/2023    1:11 PM 03/19/2023    2:40 PM  Weight /BMI  Weight 381 lb 1.9 oz 382 lb 293 lb  Height 5\' 9"  (1.753 m) 5\' 11"  (1.803 m) 5\' 11"  (1.803 m)  BMI 56.28 kg/m2 53.28 kg/m2 40.87 kg/m2

## 2023-04-18 NOTE — Assessment & Plan Note (Signed)
Taper off trazodone as able, Mother reports excellent sleep

## 2023-04-18 NOTE — Assessment & Plan Note (Signed)
Controlled, no change in medication  

## 2023-06-17 ENCOUNTER — Other Ambulatory Visit: Payer: Self-pay | Admitting: "Endocrinology

## 2023-07-12 DIAGNOSIS — E1169 Type 2 diabetes mellitus with other specified complication: Secondary | ICD-10-CM | POA: Diagnosis not present

## 2023-07-12 DIAGNOSIS — E039 Hypothyroidism, unspecified: Secondary | ICD-10-CM | POA: Diagnosis not present

## 2023-07-13 LAB — VITAMIN D 25 HYDROXY (VIT D DEFICIENCY, FRACTURES): Vit D, 25-Hydroxy: 53.2 ng/mL (ref 30.0–100.0)

## 2023-07-13 LAB — COMPREHENSIVE METABOLIC PANEL
ALT: 32 [IU]/L (ref 0–44)
AST: 23 [IU]/L (ref 0–40)
Albumin: 4.5 g/dL (ref 4.1–5.1)
Alkaline Phosphatase: 112 [IU]/L (ref 44–121)
BUN/Creatinine Ratio: 21 — ABNORMAL HIGH (ref 9–20)
BUN: 22 mg/dL (ref 6–24)
Bilirubin Total: 0.6 mg/dL (ref 0.0–1.2)
CO2: 26 mmol/L (ref 20–29)
Calcium: 9.5 mg/dL (ref 8.7–10.2)
Chloride: 103 mmol/L (ref 96–106)
Creatinine, Ser: 1.06 mg/dL (ref 0.76–1.27)
Globulin, Total: 2.6 g/dL (ref 1.5–4.5)
Glucose: 111 mg/dL — ABNORMAL HIGH (ref 70–99)
Potassium: 4.1 mmol/L (ref 3.5–5.2)
Sodium: 144 mmol/L (ref 134–144)
Total Protein: 7.1 g/dL (ref 6.0–8.5)
eGFR: 91 mL/min/{1.73_m2} (ref >59)

## 2023-07-13 LAB — LIPID PANEL
Chol/HDL Ratio: 4 {ratio} (ref 0.0–5.0)
Cholesterol, Total: 141 mg/dL (ref 100–199)
HDL: 35 mg/dL — ABNORMAL LOW (ref >39)
LDL Chol Calc (NIH): 85 mg/dL (ref 0–99)
Triglycerides: 116 mg/dL (ref 0–149)
VLDL Cholesterol Cal: 21 mg/dL (ref 5–40)

## 2023-07-13 LAB — TSH: TSH: 1.03 u[IU]/mL (ref 0.450–4.500)

## 2023-07-13 LAB — T4, FREE: Free T4: 1.4 ng/dL (ref 0.82–1.77)

## 2023-07-15 ENCOUNTER — Encounter: Payer: Self-pay | Admitting: "Endocrinology

## 2023-07-15 ENCOUNTER — Encounter: Payer: Self-pay | Admitting: Nutrition

## 2023-07-15 ENCOUNTER — Encounter: Payer: 59 | Attending: "Endocrinology | Admitting: Nutrition

## 2023-07-15 ENCOUNTER — Ambulatory Visit (INDEPENDENT_AMBULATORY_CARE_PROVIDER_SITE_OTHER): Payer: 59 | Admitting: "Endocrinology

## 2023-07-15 DIAGNOSIS — E039 Hypothyroidism, unspecified: Secondary | ICD-10-CM | POA: Diagnosis not present

## 2023-07-15 DIAGNOSIS — I10 Essential (primary) hypertension: Secondary | ICD-10-CM | POA: Diagnosis not present

## 2023-07-15 DIAGNOSIS — Z7984 Long term (current) use of oral hypoglycemic drugs: Secondary | ICD-10-CM | POA: Diagnosis not present

## 2023-07-15 DIAGNOSIS — E1169 Type 2 diabetes mellitus with other specified complication: Secondary | ICD-10-CM

## 2023-07-15 DIAGNOSIS — E785 Hyperlipidemia, unspecified: Secondary | ICD-10-CM

## 2023-07-15 DIAGNOSIS — Z6841 Body Mass Index (BMI) 40.0 and over, adult: Secondary | ICD-10-CM | POA: Diagnosis present

## 2023-07-15 LAB — POCT GLYCOSYLATED HEMOGLOBIN (HGB A1C): HbA1c, POC (controlled diabetic range): 5.7 % (ref 0.0–7.0)

## 2023-07-15 MED ORDER — EMPAGLIFLOZIN 25 MG PO TABS: 25.0000 mg | ORAL_TABLET | Freq: Every day | ORAL | 1 refills | Status: DC

## 2023-07-15 NOTE — Patient Instructions (Addendum)
Goals  Get rid of all sodas in house. Keep walking daily. Keep up the great job! Drink a gallon of water per day Lose 2-4 lbs per month.

## 2023-07-15 NOTE — Progress Notes (Signed)
Medical Nutrition Therapy  Appointment Start time:  1450  Appointment End time:  1515 Primary concerns today: DM Type 2, severe obesity Referral diagnosis: E11.8, E66.01 Preferred learning style:  Mom: no preference  Learning readiness: Ready    NUTRITION ASSESSMENT  Follow up Here with his mom. Saw Dr. Fransico Him today. Lost 30  in the last 3-6 months. He is taking Jardiance for his DM.Marland Kitchen Just started walking in the afternoons. Testing blood sugars in am and pm. FBS 120's and bedtime 140's. Mom has cut back on portions, locking up food so he can' snack or eat late at night. Still gets to some sodas from time to time. His mom is walking some in the evenings with him.  Goals set previously  Get rid of junk food of sweets, cakes and desserts, chips-done Talk to PCP about his trazodone and see if he can come off of it and see if it will help him stay up later, so he can get better sleep schedule. Increase fruits and vegetables.-doing it. Keep drinking water-done Test blood sugars once a day; am or bedtime rotate.-done Walk 15-30 minutes a day- in progress Lose 1-2 lbs per week.-done  Anthropometrics  Wt Readings from Last 3 Encounters:  07/15/23 (!) 363 lb 12.8 oz (165 kg)  04/16/23 (!) 381 lb 1.9 oz (172.9 kg)  04/09/23 (!) 382 lb (173.3 kg)   Ht Readings from Last 3 Encounters:  07/15/23 5\' 11"  (1.803 m)  04/16/23 5\' 9"  (1.753 m)  04/09/23 5\' 11"  (1.803 m)   There is no height or weight on file to calculate BMI. @BMIFA @ Facility age limit for growth %iles is 20 years. Facility age limit for growth %iles is 20 years.    Clinical Medical Hx: See chart Medications: Jardiance See chart Labs:  Lab Results  Component Value Date   HGBA1C 5.7 07/15/2023      Latest Ref Rng & Units 07/12/2023   10:52 AM 12/13/2022    4:17 PM 09/14/2022    3:27 PM  CMP  Glucose 70 - 99 mg/dL 409  811  914   BUN 6 - 24 mg/dL 22  17  20    Creatinine 0.76 - 1.27 mg/dL 7.82  9.56  2.13   Sodium  134 - 144 mmol/L 144  140  141   Potassium 3.5 - 5.2 mmol/L 4.1  3.9  4.2   Chloride 96 - 106 mmol/L 103  99  102   CO2 20 - 29 mmol/L 26  24  23    Calcium 8.7 - 10.2 mg/dL 9.5  9.2  9.1   Total Protein 6.0 - 8.5 g/dL 7.1  6.5    Total Bilirubin 0.0 - 1.2 mg/dL 0.6  0.4    Alkaline Phos 44 - 121 IU/L 112  108    AST 0 - 40 IU/L 23  22    ALT 0 - 44 IU/L 32  30     Lipid Panel     Component Value Date/Time   CHOL 141 07/12/2023 1052   TRIG 116 07/12/2023 1052   HDL 35 (L) 07/12/2023 1052   CHOLHDL 4.0 07/12/2023 1052   CHOLHDL 3.8 11/04/2019 0841   VLDL 19 09/17/2016 0834   LDLCALC 85 07/12/2023 1052   LDLCALC 92 11/04/2019 0841   LABVLDL 21 07/12/2023 1052     Notable Signs/Symptoms: None  Lifestyle & Dietary Hx LIves with mother now in [place]  Estimated daily fluid intake: 40 oz Supplements:  Sleep: goes to bed  5 pm and gets up at 1:30 am Stress / self-care:  \ Current average weekly physical activity: ADL  24-Hr Dietary Recall First Meal: Raisin bran and apple, water Snack:  Second Meal: Baked chicken, broccoli and cheese and water  PInto beans, pinto beans, cabbage, chicken, water  Beverages: water  Estimated Energy Needs Calories: 1800-2000 Carbohydrate: 225g Protein: 150g Fat: 56g   NUTRITION DIAGNOSIS  NI-1.7 Predicted excessive energy intake As related to high calorie diet and excessive food intake.  As evidenced by A1C 6.6% and BMI 53.Marland Kitchen   NUTRITION INTERVENTION  Nutrition education (E-1) on the following topics:  Nutrition and Diabetes education provided on My Plate, CHO counting, meal planning, portion sizes, timing of meals, avoiding snacks between meals unless having a low blood sugar, target ranges for A1C and blood sugars, signs/symptoms and treatment of hyper/hypoglycemia, monitoring blood sugars, taking medications as prescribed, benefits of exercising 30 minutes per day and prevention of complications of DM.    Handouts Provided  Include  My Plate Diabetes Instructions.  Learning Style & Readiness for Change Teaching method utilized: Visual & Auditory  Demonstrated degree of understanding via: Teach Back  Barriers to learning/adherence to lifestyle change: Mother- none  Goals Established by Pt   Get rid of all sodas in house. Keep walking daily. Keep up the great job! Drink a gallon of water per day Lose 2-4 lbs per months.   MONITORING & EVALUATION Dietary intake, weekly physical activity, and BS and weight in 3 months.  Next Steps  Patient is to work on eating more foods from a garden and cut out processed foods.Marland Kitchen

## 2023-07-15 NOTE — Progress Notes (Signed)
07/15/2023, 2:44 PM  Endocrinology follow-up note   Subjective:    Patient ID: Scott Mendez, male    DOB: 06-Sep-1980.  Scott Mendez is being seen in follow-up after he was seen in consultation for management of currently controlled asymptomatic diabetes , hypothyroidism requested by  Kerri Perches, MD.   Past Medical History:  Diagnosis Date   Autism    Hypertension    states under control with meds., has been on med. x 8 yr.   Morbid obesity with BMI of 45.0-49.9, adult (HCC)    Nonrestorable tooth 02/2018   x 2   Nonverbal    Sleep apnea    mild - no CPAP    Past Surgical History:  Procedure Laterality Date   CYST EXCISION Right 03/03/2002   mandible   MULTIPLE TOOTH EXTRACTIONS  03/03/2002   #1, 16, 17, 30, 32   TOENAIL EXCISION Bilateral    great toe   TOOTH EXTRACTION Right 09/03/2014   Procedure: EXTRACTION MOLAR LOWER RIGHT;  Surgeon: Georgia Lopes, DDS;  Location: [facility] OR;  Service: Oral Surgery;  Laterality: Right;   TOOTH EXTRACTION N/A 03/06/2018   Procedure: EXTRACTION X 1;  Surgeon: Ocie Doyne, DDS;  Location: [facility];  Service: Oral Surgery;  Laterality: N/A;   TOOTH EXTRACTION N/A 12/20/2020   Procedure: DENTAL RESTORATION/EXTRACTIONS TEETH THIRTEEN AND NINETEEN.;  Surgeon: Ocie Doyne, DMD;  Location: [facility];  Service: Oral Surgery;  Laterality: N/A;    Social History   Socioeconomic History   Marital status: Single    Spouse name: N/A   Number of children: 0   Years of education: Not on file   Highest education level: Not on file  Occupational History   Not on file  Tobacco Use   Smoking status: Never   Smokeless tobacco: Never  Vaping Use   Vaping status: Never Used  Substance and Sexual Activity   Alcohol use: No   Drug use: No   Sexual activity: Never  Other Topics Concern   Not on file  Social History Narrative   Lives with Mother and sister    Social Determinants of  Health   Financial Resource Strain: Low Risk  (06/23/2020)   Overall Financial Resource Strain (CARDIA)    Difficulty of Paying Living Expenses: Not hard at all  Food Insecurity: No Food Insecurity (06/23/2020)   Hunger Vital Sign    Worried About Running Out of Food in the Last Year: Never true    Ran Out of Food in the Last Year: Never true  Transportation Needs: No Transportation Needs (06/23/2020)   PRAPARE - Administrator, Civil Service (Medical): No    Lack of Transportation (Non-Medical): No  Physical Activity: Insufficiently Active (06/23/2020)   Exercise Vital Sign    Days of Exercise per Week: 4 days    Minutes of Exercise per Session: 30 min  Stress: No Stress Concern Present (06/23/2020)   [employer] of Occupational Health - Occupational Stress Questionnaire    Feeling of Stress : Not at all  Social Connections: Socially Isolated (06/23/2020)   Social Connection and Isolation Panel [NHANES]    Frequency of Communication with Friends and Family: Never    Frequency of Social Gatherings with Friends and Family: Twice a week    Attends Religious Services: More than 4 times per year    Active Member of Clubs or Organizations: No    Attends  Banker Meetings: Never    Marital Status: Never married    Family History  Problem Relation Age of Onset   Hypertension Mother    Hypertension Father    Diabetes Father    Heart disease Maternal Grandfather    Diabetes Paternal Grandmother    Seizures Maternal Uncle    CVA Maternal Grandmother    Heart disease Paternal Grandfather     Outpatient Encounter Medications as of 07/15/2023  Medication Sig   acetaminophen (TYLENOL) 500 MG tablet Take 1,000 mg by mouth every 6 (six) hours as needed for moderate pain or headache.   blood glucose meter kit and supplies KIT 1 each by Does not apply route 2 (two) times daily. Dispense based on patient and insurance preference. Use twice daily as directed.    empagliflozin (JARDIANCE) 25 MG TABS tablet Take 1 tablet (25 mg total) by mouth daily before breakfast.   furosemide (LASIX) 20 MG tablet TAKE 1 TABLET BY MOUTH TWICE A DAY   glucose blood test strip Use to test blood glucose twice daily as instructed   KLOR-CON M20 20 MEQ tablet TAKE 1 TABLET BY MOUTH TWICE A DAY   Lancets MISC Use to check blood glucose twice daily   levothyroxine (SYNTHROID) 88 MCG tablet TAKE 1 TABLET BY MOUTH DAILY BEFORE BREAKFAST.   loratadine (CLARITIN) 10 MG tablet TAKE 1 TABLET BY MOUTH EVERY DAY   montelukast (SINGULAIR) 10 MG tablet TAKE 1 TABLET BY MOUTH EVERYDAY AT BEDTIME   olmesartan-hydrochlorothiazide (BENICAR HCT) 40-12.5 MG tablet TAKE 1 TABLET BY MOUTH EVERY DAY   selenium sulfide (SELSUN) 1 % LOTN Wash  Hair daily with 12 cc shampoo for 3 weeks , then twice weekly (Patient taking differently: Apply 1 application  topically 2 (two) times a week. Wash hair with 12 cc)   UNABLE TO FIND Med Name: TDAP vaccine   Vitamin D, Ergocalciferol, (DRISDOL) 1.25 MG (50000 UNIT) CAPS capsule TAKE 1 CAPSULE BY MOUTH ONE TIME PER WEEK   [DISCONTINUED] JARDIANCE 10 MG TABS tablet TAKE 1 TABLET BY MOUTH DAILY BEFORE BREAKFAST.   No facility-administered encounter medications on file as of 07/15/2023.    ALLERGIES: No Known Allergies  VACCINATION STATUS: Immunization History  Administered Date(s) Administered   H1N1 06/03/2008   Influenza Split 04/22/2012   Influenza Whole 05/07/2007, 05/02/2009, 04/17/2010   Influenza, Seasonal, Injecte, Preservative Fre 04/16/2023   Influenza,inj,Quad PF,6+ Mos 05/18/2013, 07/20/2014, 04/04/2015, 04/04/2016, 04/10/2017, 05/05/2018, 04/20/2019, 04/13/2020, 05/11/2021, 04/05/2022   Moderna Sars-Covid-2 Vaccination 12/07/2019, 01/06/2020   Td 11/23/2002   Tdap 12/19/2011    Diabetes He presents for his follow-up diabetic visit. He has type 2 diabetes mellitus. Onset time: No diagnosis at age 17. His disease course has been  improving. Hypoglycemia symptoms include confusion. Pertinent negatives for hypoglycemia include no headaches, pallor or seizures. There are no diabetic associated symptoms. Pertinent negatives for diabetes include no chest pain, no fatigue, no polydipsia, no polyphagia, no polyuria and no weakness. There are no hypoglycemic complications. Symptoms are stable. (His type 2 diabetes is complicated by developmental deficit with autism, hyperlipidemia, hypertension, obesity.) Risk factors for coronary artery disease include family history, dyslipidemia, hypertension, male sex, obesity and sedentary lifestyle. When asked about current treatments, none were reported. His weight is decreasing steadily (Patient presents with 30 pounds of weight loss.). He is following a generally unhealthy diet. When asked about meal planning, he reported none. He has not had a previous visit with a dietitian. He never participates in exercise.  His home blood glucose trend is decreasing steadily. (He is accompanied by his mother to clinic.  His point-of-care A1c is 5.5%, improving from 6.6%.   ) An ACE inhibitor/angiotensin II receptor blocker is being taken.  Hyperlipidemia This is a chronic problem. The current episode started more than 1 year ago. The problem is uncontrolled. Exacerbating diseases include diabetes, hypothyroidism and obesity. Pertinent negatives include no chest pain, myalgias or shortness of breath. He is currently on no antihyperlipidemic treatment. Risk factors for coronary artery disease include diabetes mellitus, dyslipidemia, hypertension, male sex, a sedentary lifestyle, family history and obesity.  Hypertension This is a chronic problem. The current episode started more than 1 year ago. Pertinent negatives include no chest pain, headaches, neck pain, palpitations or shortness of breath. Risk factors for coronary artery disease include dyslipidemia, family history, diabetes mellitus, male gender, obesity and  sedentary lifestyle. Past treatments include angiotensin blockers.     Review of Systems  Constitutional:  Negative for chills, fatigue, fever and unexpected weight change.  HENT:  Negative for dental problem, mouth sores and trouble swallowing.   Eyes:  Negative for visual disturbance.  Respiratory:  Negative for cough, choking, chest tightness, shortness of breath and wheezing.   Cardiovascular:  Negative for chest pain, palpitations and leg swelling.  Gastrointestinal:  Negative for abdominal distention, abdominal pain, constipation, diarrhea, nausea and vomiting.  Endocrine: Negative for polydipsia, polyphagia and polyuria.  Genitourinary:  Negative for dysuria, flank pain, hematuria and urgency.  Musculoskeletal:  Negative for back pain, gait problem, myalgias and neck pain.  Skin:  Negative for pallor, rash and wound.  Neurological:  Negative for seizures, syncope, weakness, numbness and headaches.  Psychiatric/Behavioral:  Positive for agitation and confusion. Negative for dysphoric mood.        Patient with autistic disorder, nonverbal.    Objective:       07/15/2023    2:25 PM 04/16/2023    2:42 PM 04/09/2023    1:11 PM  Vitals with BMI  Height 5\' 11"  5\' 9"  5\' 11"   Weight 363 lbs 13 oz 381 lbs 2 oz 382 lbs  BMI 50.76 56.26 53.3  Systolic 114 128   Diastolic 70 86   Pulse 84 85     BP 114/70   Pulse 84   Ht 5\' 11"  (1.803 m)   Wt (!) 363 lb 12.8 oz (165 kg)   BMI 50.74 kg/m   Wt Readings from Last 3 Encounters:  07/15/23 (!) 363 lb 12.8 oz (165 kg)  04/16/23 (!) 381 lb 1.9 oz (172.9 kg)  04/09/23 (!) 382 lb (173.3 kg)       CMP ( most recent) CMP     Component Value Date/Time   NA 144 07/12/2023 1052   K 4.1 07/12/2023 1052   CL 103 07/12/2023 1052   CO2 26 07/12/2023 1052   GLUCOSE 111 (H) 07/12/2023 1052   GLUCOSE 88 11/04/2019 0841   BUN 22 07/12/2023 1052   CREATININE 1.06 07/12/2023 1052   CREATININE 1.01 11/04/2019 0841   CALCIUM 9.5 07/12/2023  1052   PROT 7.1 07/12/2023 1052   ALBUMIN 4.5 07/12/2023 1052   AST 23 07/12/2023 1052   ALT 32 07/12/2023 1052   ALKPHOS 112 07/12/2023 1052   BILITOT 0.6 07/12/2023 1052   EGFR 91 07/12/2023 1052   GFRNONAA 95 11/04/2019 0841     Diabetic Labs (most recent): Lab Results  Component Value Date   HGBA1C 5.7 07/15/2023   HGBA1C 6.2 03/19/2023  HGBA1C 6.6 (H) 12/13/2022     Lipid Panel ( most recent) Lipid Panel     Component Value Date/Time   CHOL 141 07/12/2023 1052   TRIG 116 07/12/2023 1052   HDL 35 (L) 07/12/2023 1052   CHOLHDL 4.0 07/12/2023 1052   CHOLHDL 3.8 11/04/2019 0841   VLDL 19 09/17/2016 0834   LDLCALC 85 07/12/2023 1052   LDLCALC 92 11/04/2019 0841   LABVLDL 21 07/12/2023 1052      Lab Results  Component Value Date   TSH 1.030 07/12/2023   TSH 5.460 (H) 12/13/2022   TSH 3.110 04/13/2022   TSH 2.630 11/07/2020   TSH 1.81 11/04/2019   TSH 2.59 12/31/2018   TSH 1.85 09/09/2017   TSH 2.43 09/17/2016   TSH 1.00 03/23/2016   TSH 1.954 03/14/2015   FREET4 1.40 07/12/2023   FREET4 1.25 12/13/2022       Assessment & Plan:   1. Type 2 diabetes mellitus with morbid obesity (HCC)  - Scott Mendez has currently uncontrolled symptomatic type 2 DM since  42 years of age. He is accompanied by his mother to clinic.  His point-of-care A1c is 5.5%, improving from 6.6%.   Recent labs reviewed. -Patient is autistic, nonverbal.  His care plan was discussed with his mother.  -his diabetes is complicated by comorbid hyperlipidemia, hypertension, morbid obesity, developmental deficit and he remains at a high risk for more acute and chronic complications which include CAD, CVA, CKD, retinopathy, and neuropathy. These are all discussed in detail with him.  - I discussed all available options of managing his diabetes including de-escalation of medications. I have counseled him on Food as Medicine by adopting a Whole Food , Plant Predominant  ( WFPP) nutrition  as recommended by [employer] of Lifestyle Medicine. Patient is encouraged to switch to  unprocessed or minimally processed  complex starch, adequate protein intake (mainly plant source), minimal liquid fat, plenty of fruits, and vegetables. -  he is advised to stick to a routine mealtimes to eat 3 complete meals a day and snack only when necessary ( to snack only to correct hypoglycemia BG <70 day time or <100 at night).  - he will be scheduled with Norm Salt, RDN, CDE for individualized diabetes education.  - I have approached him with the following individualized plan to manage  his diabetes and patient agrees:   -His point-of-care A1c is 5.5%, progressively improving from 6.6%.  He is benefiting from SGLT2 inhibitor from weight loss point of view.  I discussed and increase his Jardiance to 25 mg p.o. daily at breakfast.    Side effects and precautions discussed with the family. He would have benefited more from GLP-1 receptor agonist, however reportedly he gets anxious with injections. - Specific targets for  A1c;  LDL, HDL,  and Triglycerides were discussed with the patient.  2) Blood Pressure /Hypertension:   His blood pressure is controlled to target.   he is advised to continue his current medications including Benicar HCT 40-12.5 mg p.o. daily with breakfast .  3) Lipids/Hyperlipidemia:   Review of his recent lipid panel showed  uncontrolled  LDL at 92 .  -He will not be started on statins for now.  He will be treated for hypothyroidism-see below.   4)  Weight/Diet:  Body mass index is 50.74 kg/m.  -Patient presents with 30 pounds of weight loss.  H is high BMI is clearly complicating his diabetes care.   he is  a candidate  for weight loss.  A single most important intervention for this patient would be weight loss.  If he does not respond to SGLT2 inhibitors , he will be considered for GLP-1 receptor agonist.  I discussed with him the fact that loss of 5 - 10% of his  current  body weight will have the most impact on his diabetes management.  The above detailed  ACLM recommendations for nutrition, exercise, sleep, social life, avoidance of risky substances, the need for restorative sleep   information will also detailed on discharge instructions.   5) hypothyroidism:  He was initiated on low-dose levothyroxine during his last visit.  He is tolerating his medication and benefiting from it.  He is advised to continue levothyroxine 25 mg p.o. daily before breakfast.   - We discussed about the correct intake of his thyroid hormone, on empty stomach at fasting, with water, separated by at least 30 minutes from breakfast and other medications,  and separated by more than 4 hours from calcium, iron, multivitamins, acid reflux medications (PPIs). -Patient is made aware of the fact that thyroid hormone replacement is needed for life, dose to be adjusted by periodic monitoring of thyroid function tests.   6) Chronic Care/Health Maintenance:  -he  is on ARB medications and  is encouraged to initiate and continue to follow up with Ophthalmology, Dentist,  Podiatrist at least yearly or according to recommendations, and advised to   stay away from smoking. I have recommended yearly flu vaccine and pneumonia vaccine at least every 5 years; moderate intensity exercise for up to 150 minutes weekly; and  sleep for 7- 9 hours a day.  - he is  advised to maintain close follow up with Kerri Perches, MD for primary care needs, as well as his other providers for optimal and coordinated care.  I spent  26 more minutes in the care of the patient today including review of labs from CMP, Lipids, Thyroid Function, Hematology (current and previous including abstractions from other facilities); face-to-face time discussing  his blood glucose readings/logs, discussing hypoglycemia and hyperglycemia episodes and symptoms, medications doses, his options of short and long term treatment based on the  latest standards of care / guidelines;  discussion about incorporating lifestyle medicine;  and documenting the encounter. Risk reduction counseling performed per USPSTF guidelines to reduce  obesity and cardiovascular risk factors.     Please refer to Patient Instructions for Blood Glucose Monitoring and Insulin/Medications Dosing Guide"  in media tab for additional information. Please  also refer to " Patient Self Inventory" in the Media  tab for reviewed elements of pertinent patient history.  Scott Mendez participated in the discussions, expressed understanding, and voiced agreement with the above plans.  All questions were answered to his satisfaction. he is encouraged to contact clinic should he have any questions or concerns prior to his return visit.    Follow up plan: - Return in about 4 months (around 11/13/2023) for A1c -NV.  Marquis Lunch, MD [facility] [address] [place], [place] [address] Phone: [phone]  Fax: [phone]    07/15/2023, 2:44 PM  This note was partially dictated with voice recognition software. Similar sounding words can be transcribed inadequately or may not  be corrected upon review.

## 2023-07-15 NOTE — Patient Instructions (Signed)

## 2023-08-03 ENCOUNTER — Other Ambulatory Visit: Payer: Self-pay | Admitting: Family Medicine

## 2023-08-03 DIAGNOSIS — E559 Vitamin D deficiency, unspecified: Secondary | ICD-10-CM

## 2023-08-03 DIAGNOSIS — I10 Essential (primary) hypertension: Secondary | ICD-10-CM

## 2023-08-16 ENCOUNTER — Ambulatory Visit: Payer: 59 | Admitting: Family Medicine

## 2023-09-07 ENCOUNTER — Other Ambulatory Visit: Payer: Self-pay | Admitting: Family Medicine

## 2023-09-12 ENCOUNTER — Other Ambulatory Visit: Payer: Self-pay | Admitting: Family Medicine

## 2023-09-16 ENCOUNTER — Encounter (INDEPENDENT_AMBULATORY_CARE_PROVIDER_SITE_OTHER): Payer: 59 | Admitting: Family Medicine

## 2023-09-20 ENCOUNTER — Ambulatory Visit (INDEPENDENT_AMBULATORY_CARE_PROVIDER_SITE_OTHER): Payer: 59 | Admitting: Family Medicine

## 2023-09-20 ENCOUNTER — Encounter: Payer: Self-pay | Admitting: Family Medicine

## 2023-09-20 VITALS — BP 115/80 | HR 91 | Ht 71.0 in | Wt 352.1 lb

## 2023-09-20 DIAGNOSIS — E039 Hypothyroidism, unspecified: Secondary | ICD-10-CM

## 2023-09-20 DIAGNOSIS — E118 Type 2 diabetes mellitus with unspecified complications: Secondary | ICD-10-CM | POA: Diagnosis not present

## 2023-09-20 DIAGNOSIS — E559 Vitamin D deficiency, unspecified: Secondary | ICD-10-CM

## 2023-09-20 DIAGNOSIS — J302 Other seasonal allergic rhinitis: Secondary | ICD-10-CM | POA: Diagnosis not present

## 2023-09-20 DIAGNOSIS — E1159 Type 2 diabetes mellitus with other circulatory complications: Secondary | ICD-10-CM

## 2023-09-20 DIAGNOSIS — Z6841 Body Mass Index (BMI) 40.0 and over, adult: Secondary | ICD-10-CM

## 2023-09-20 DIAGNOSIS — I10 Essential (primary) hypertension: Secondary | ICD-10-CM

## 2023-09-20 NOTE — Patient Instructions (Addendum)
Annual exam in mid June, call if you need me sooner  CONGRATS on much improved health  CBC, chem 7 and EGFR, Vit D  3 to 7 days before June appointment  CONGRATS on improved health with great weight loss , keep it up  Thanks for choosing [facility], we consider it a privelige to serve you.

## 2023-09-21 DIAGNOSIS — E559 Vitamin D deficiency, unspecified: Secondary | ICD-10-CM | POA: Insufficient documentation

## 2023-09-21 NOTE — Assessment & Plan Note (Signed)
On medication and managed by Endo

## 2023-09-21 NOTE — Assessment & Plan Note (Signed)
 Continue weekly supplement  Updated lab needed at/ before next visit.

## 2023-09-21 NOTE — Assessment & Plan Note (Addendum)
Controlled, no change in medication DASH diet and commitment to daily physical activity for a minimum of 30 minutes discussed and encouraged, as a part of hypertension management. The importance of attaining a healthy weight is also discussed.     09/20/2023    3:11 PM 07/15/2023    2:50 PM 07/15/2023    2:25 PM 04/16/2023    2:42 PM 04/09/2023    1:11 PM 03/19/2023    2:40 PM 02/28/2023   10:06 AM  BP/Weight  Systolic BP 115  829 128  130 562  Diastolic BP 80  70 86  86 85  Wt. (Lbs) 352.08 363 363.8 381.12 382 293 390  BMI 49.11 kg/m2 50.63 kg/m2 50.74 kg/m2 56.28 kg/m2 53.28 kg/m2 40.87 kg/m2 54.39 kg/m2

## 2023-09-21 NOTE — Assessment & Plan Note (Signed)
  Patient re-educated about  the importance of commitment to a  minimum of 150 minutes of exercise per week as able.  The importance of healthy food choices with portion control discussed, as well as eating regularly and within a 12 hour window most days. The need to choose "clean , green" food 50 to 75% of the time is discussed, as well as to make water the primary drink and set a goal of 64 ounces water daily.       09/20/2023    3:11 PM 07/15/2023    2:50 PM 07/15/2023    2:25 PM  Weight /BMI  Weight 352 lb 1.3 oz 363 lb 363 lb 12.8 oz  Height 5\' 11"  (1.803 m) 5\' 11"  (1.803 m) 5\' 11"  (1.803 m)  BMI 49.11 kg/m2 50.63 kg/m2 50.74 kg/m2    Excellent weight loss in past 6  months

## 2023-09-21 NOTE — Progress Notes (Signed)
Scott Mendez     MRN: [id number]      DOB: 1980/12/05  Chief Complaint  Patient presents with   Follow-up    Follow  up    HPI Mother provide  history, Scott Mendez autistic Scott Mendez is here for follow up and re-evaluation of chronic medical conditions, medication management and review of any available recent lab and radiology data.  Preventive health is updated, specifically  Cancer screening and Immunization.   Questions or concerns regarding consultations or procedures which the Scott Mendez has had in the interim are  addressed.Doing extremely well with weight loss since starting with endo denies any adverse reactions to current medications since the last visit.  There are no new concerns.  There are no specific complaints   ROS Denies recent fever or chills. Denies sinus pressure, nasal congestion, ear pain or sore throat. Denies chest congestion, productive cough or wheezing. Denies chest pains, palpitations and leg swelling Denies abdominal pain, nausea, vomiting,diarrhea or constipation.   Denies dysuria, frequency, hesitancy or incontinence. Denies joint pain, swelling and limitation in mobility. Denies headaches, seizures, numbness, or tingling. Denies depression, anxiety or insomnia. Denies skin break down or rash.   PE  BP 115/80 (BP Location: Right Arm, Patient Position: Sitting, Cuff Size: Large)   Pulse 91   Ht 5\' 11"  (1.803 m)   Wt (!) 352 lb 1.3 oz (159.7 kg)   SpO2 93%   BMI 49.11 kg/m   Patient alert and in no cardiopulmonary distress.  HEENT: No facial asymmetry, EOMI,     Neck supple .  Chest: Clear to auscultation bilaterally.  CVS: S1, S2 no murmurs, no S3.Regular rate.  ABD: Soft non tender.   Ext: No edema  MS: Adequate though reduced  ROM spine, shoulders, hips and knees.  Skin: Intact, no ulcerations or rash noted.  Psych: not anxious or depressed appearing.  CNS: CN 2-12 intact, power,  normal throughout.no focal deficits  noted.   Assessment & Plan  Essential hypertension, benign Controlled, no change in medication DASH diet and commitment to daily physical activity for a minimum of 30 minutes discussed and encouraged, as a part of hypertension management. The importance of attaining a healthy weight is also discussed.     09/20/2023    3:11 PM 07/15/2023    2:50 PM 07/15/2023    2:25 PM 04/16/2023    2:42 PM 04/09/2023    1:11 PM 03/19/2023    2:40 PM 02/28/2023   10:06 AM  BP/Weight  Systolic BP 115  782 128  130 956  Diastolic BP 80  70 86  86 85  Wt. (Lbs) 352.08 363 363.8 381.12 382 293 390  BMI 49.11 kg/m2 50.63 kg/m2 50.74 kg/m2 56.28 kg/m2 53.28 kg/m2 40.87 kg/m2 54.39 kg/m2       Hypothyroidism On medication and managed by Endo  Controlled diabetes mellitus type 2 with complications (HCC) Diabetes associated with hypertension, obesity, and arthritis  Scott Mendez is reminded of the importance of commitment to daily physical activity for 30 minutes or more, as able and the need to limit carbohydrate intake to 30 to 60 grams per meal to help with blood sugar control.   The need to take medication as prescribed, test blood sugar as directed, and to call between visits if there is a concern that blood sugar is uncontrolled is also discussed.   Scott Mendez is reminded of the importance of daily foot exam, annual eye examination, and good blood sugar, blood pressure  and cholesterol control. Improved and managed by Endo     Latest Ref Rng & Units 07/15/2023    2:36 PM 07/12/2023   10:52 AM 04/16/2023    3:49 PM 03/19/2023    4:33 PM 12/13/2022    4:17 PM  Diabetic Labs  HbA1c 0.0 - 7.0 % 5.7    6.2  6.6   Micro/Creat Ratio 0 - 29 mg/g creat   7     Chol 100 - 199 mg/dL  478    295   HDL >62 mg/dL  35    37   Calc LDL 0 - 99 mg/dL  85    71   Triglycerides 0 - 149 mg/dL  130    865   Creatinine 0.76 - 1.27 mg/dL  7.84    6.96       2/95/2841    3:11 PM 07/15/2023    2:50 PM  07/15/2023    2:25 PM 04/16/2023    2:42 PM 04/09/2023    1:11 PM 03/19/2023    2:40 PM 02/28/2023   10:06 AM  BP/Weight  Systolic BP 115  324 128  130 401  Diastolic BP 80  70 86  86 85  Wt. (Lbs) 352.08 363 363.8 381.12 382 293 390  BMI 49.11 kg/m2 50.63 kg/m2 50.74 kg/m2 56.28 kg/m2 53.28 kg/m2 40.87 kg/m2 54.39 kg/m2       No data to display              Morbid obesity  Patient re-educated about  the importance of commitment to a  minimum of 150 minutes of exercise per week as able.  The importance of healthy food choices with portion control discussed, as well as eating regularly and within a 12 hour window most days. The need to choose "clean , green" food 50 to 75% of the time is discussed, as well as to make water the primary drink and set a goal of 64 ounces water daily.       09/20/2023    3:11 PM 07/15/2023    2:50 PM 07/15/2023    2:25 PM  Weight /BMI  Weight 352 lb 1.3 oz 363 lb 363 lb 12.8 oz  Height 5\' 11"  (1.803 m) 5\' 11"  (1.803 m) 5\' 11"  (1.803 m)  BMI 49.11 kg/m2 50.63 kg/m2 50.74 kg/m2    Excellent weight loss in past 6  months  Seasonal allergies Controlled, no change in medication   Vitamin D deficiency Continue weekly supplement Updated lab needed at/ before next visit.

## 2023-09-21 NOTE — Assessment & Plan Note (Signed)
 Controlled, no change in medication

## 2023-09-21 NOTE — Assessment & Plan Note (Addendum)
Diabetes associated with hypertension, obesity, and arthritis  Scott Mendez is reminded of the importance of commitment to daily physical activity for 30 minutes or more, as able and the need to limit carbohydrate intake to 30 to 60 grams per meal to help with blood sugar control.   The need to take medication as prescribed, test blood sugar as directed, and to call between visits if there is a concern that blood sugar is uncontrolled is also discussed.   Scott Mendez is reminded of the importance of daily foot exam, annual eye examination, and good blood sugar, blood pressure and cholesterol control. Improved and managed by Endo     Latest Ref Rng & Units 07/15/2023    2:36 PM 07/12/2023   10:52 AM 04/16/2023    3:49 PM 03/19/2023    4:33 PM 12/13/2022    4:17 PM  Diabetic Labs  HbA1c 0.0 - 7.0 % 5.7    6.2  6.6   Micro/Creat Ratio 0 - 29 mg/g creat   7     Chol 100 - 199 mg/dL  098    119   HDL >14 mg/dL  35    37   Calc LDL 0 - 99 mg/dL  85    71   Triglycerides 0 - 149 mg/dL  782    956   Creatinine 0.76 - 1.27 mg/dL  2.13    0.86       5/78/4696    3:11 PM 07/15/2023    2:50 PM 07/15/2023    2:25 PM 04/16/2023    2:42 PM 04/09/2023    1:11 PM 03/19/2023    2:40 PM 02/28/2023   10:06 AM  BP/Weight  Systolic BP 115  295 128  130 284  Diastolic BP 80  70 86  86 85  Wt. (Lbs) 352.08 363 363.8 381.12 382 293 390  BMI 49.11 kg/m2 50.63 kg/m2 50.74 kg/m2 56.28 kg/m2 53.28 kg/m2 40.87 kg/m2 54.39 kg/m2       No data to display

## 2023-09-30 ENCOUNTER — Ambulatory Visit (INDEPENDENT_AMBULATORY_CARE_PROVIDER_SITE_OTHER): Payer: 59

## 2023-09-30 VITALS — Ht 67.0 in | Wt 363.0 lb

## 2023-09-30 DIAGNOSIS — Z0000 Encounter for general adult medical examination without abnormal findings: Secondary | ICD-10-CM

## 2023-09-30 NOTE — Patient Instructions (Signed)
 [name] , Thank you for taking time to come for your Medicare Wellness Visit. I appreciate your ongoing commitment to your health goals. Please review the following plan we discussed and let me know if I can assist you in the future.   Referrals/Orders/Follow-Ups/Clinician Recommendations:  Next Medicare Annual Wellness Visit:   September 30, 2024 at 1:10 pm telephone visit.   This is a list of the screening recommended for you and due dates:  Health Maintenance  Topic Date Due   Pneumococcal Vaccination (1 of 2 - PCV) Never done   Eye exam for diabetics  Never done   DTaP/Tdap/Td vaccine (3 - Td or Tdap) 12/18/2021   COVID-19 Vaccine (5 - 2024-25 season) 04/07/2023   Hemoglobin A1C  01/13/2024   Yearly kidney health urinalysis for diabetes  04/15/2024   Complete foot exam   04/17/2024   Yearly kidney function blood test for diabetes  07/11/2024   Medicare Annual Wellness Visit  09/29/2024   Flu Shot  Completed   Hepatitis C Screening  Completed   HIV Screening  Completed   HPV Vaccine  Aged Out    Advanced directives: (Declined) Advance directive discussed with you today. Even though you declined this today, please call our office should you change your mind, and we can give you the proper paperwork for you to fill out.  Next Medicare Annual Wellness Visit scheduled for next year: yes  Understanding Your Risk for Falls Millions of people have serious injuries from falls each year. It is important to understand your risk of falling. Talk with your health care provider about your risk and what you can do to lower it. If you do have a serious fall, make sure to tell your provider. Falling once raises your risk of falling again. How can falls affect me? Serious injuries from falls are common. These include: Broken bones, such as hip fractures. Head injuries, such as traumatic brain injuries (TBI) or concussions. A fear of falling can cause you to avoid activities and stay at  home. This can make your muscles weaker and raise your risk for a fall. What can increase my risk? There are a number of risk factors that increase your risk for falling. The more risk factors you have, the higher your risk of falling. Serious injuries from a fall happen most often to people who are older than 41 years old. Teenagers and young adults ages 71-29 are also at higher risk. Common risk factors include: Weakness in the lower body. Being generally weak or confused due to long-term (chronic) illness. Dizziness or balance problems. Poor vision. Medicines that cause dizziness or drowsiness. These may include: Medicines for your blood pressure, heart, anxiety, insomnia, or swelling (edema). Pain medicines. Muscle relaxants. Other risk factors include: Drinking alcohol. Having had a fall in the past. Having foot pain or wearing improper footwear. Working at a dangerous job. Having any of the following in your home: Tripping hazards, such as floor clutter or loose rugs. Poor lighting. Pets. Having dementia or memory loss. What actions can I take to lower my risk of falling?     Physical activity Stay physically fit. Do strength and balance exercises. Consider taking a regular class to build strength and balance. Yoga and tai chi are good options. Vision Have your eyes checked every year and your prescription for glasses or contacts updated as needed. Shoes and walking aids Wear non-skid shoes. Wear shoes that have rubber soles and low heels. Do not wear high  heels. Do not walk around the house in socks or slippers. Use a cane or walker as told by your provider. Home safety Attach secure railings on both sides of your stairs. Install grab bars for your bathtub, shower, and toilet. Use a non-skid mat in your bathtub or shower. Attach bath mats securely with double-sided, non-slip rug tape. Use good lighting in all rooms. Keep a flashlight near your bed. Make sure there is a  clear path from your bed to the bathroom. Use night-lights. Do not use throw rugs. Make sure all carpeting is taped or tacked down securely. Remove all clutter from walkways and stairways, including extension cords. Repair uneven or broken steps and floors. Avoid walking on icy or slippery surfaces. Walk on the grass instead of on icy or slick sidewalks. Use ice melter to get rid of ice on walkways in the winter. Use a cordless phone. Questions to ask your health care provider Can you help me check my risk for a fall? Do any of my medicines make me more likely to fall? Should I take a vitamin D supplement? What exercises can I do to improve my strength and balance? Should I make an appointment to have my vision checked? Do I need a bone density test to check for weak bones (osteoporosis)? Would it help to use a cane or a walker? Where to find more information Centers for Disease Control and Prevention, STEADI: TonerPromos.no Community-Based Fall Prevention Programs: TonerPromos.no [employer] on Aging: BaseRingTones.pl Contact a health care provider if: You fall at home. You are afraid of falling at home. You feel weak, drowsy, or dizzy. This information is not intended to replace advice given to you by your health care provider. Make sure you discuss any questions you have with your health care provider. Document Revised: 03/26/2022 Document Reviewed: 03/26/2022 Elsevier Patient Education  2024 [employer].

## 2023-09-30 NOTE — Telephone Encounter (Unsigned)
 Copied from CRM [id number]. Topic: Clinical - Medication Question >> Sep 30, 2023  8:52 AM Gildardo Pounds wrote: Reason for CRM: Argentina Donovan with [facility] needs an updated medication list for patient. Please fill out all the entries on the list. A typed copy of the request was faxed to office today. Callback [phone]

## 2023-09-30 NOTE — Progress Notes (Signed)
 Mother assisted with visit due to patient having autism and is non veral Subjective:   Scott Mendez is a 43 y.o. who presents for a Medicare Wellness preventive visit.  Visit Complete: Virtual I connected with  Scott Mendez on 09/30/23 by a audio enabled telemedicine application and verified that I am speaking with the correct person using two identifiers.  Patient Location: Home  Provider Location: Home Office  I discussed the limitations of evaluation and management by telemedicine. The patient expressed understanding and agreed to proceed.  Vital Signs: Because this visit was a virtual/telehealth visit, some criteria may be missing or patient reported. Any vitals not documented were not able to be obtained and vitals that have been documented are patient reported.  VideoDeclined- This patient declined Librarian, academic. Therefore the visit was completed with audio only.  AWV Questionnaire: No: Patient Medicare AWV questionnaire was not completed prior to this visit.  Cardiac Risk Factors include: advanced age (>61men, >3 women);diabetes mellitus;dyslipidemia;hypertension;male gender;obesity (BMI >30kg/m2);sedentary lifestyle     Objective:    Today's Vitals   09/30/23 1409  Weight: (!) 363 lb (164.7 kg)  Height: 5\' 7"  (1.702 m)   Body mass index is 56.85 kg/m.     09/30/2023    2:14 PM 07/05/2021   11:04 AM 07/05/2021   10:28 AM 04/24/2021    9:43 AM 12/20/2020    9:49 AM 07/07/2020   11:02 AM 06/23/2020    1:42 PM  Advanced Directives  Does Patient Have a Medical Advance Directive? No No No No No No No  Would patient like information on creating a medical advance directive? No - Patient declined Yes (ED - Information included in AVS) Yes (ED - Information included in AVS) No - Patient declined  No - Patient declined No - Patient declined    Current Medications (verified) Outpatient Encounter Medications as of 09/30/2023   Medication Sig   acetaminophen (TYLENOL) 500 MG tablet Take 1,000 mg by mouth every 6 (six) hours as needed for moderate pain or headache.   blood glucose meter kit and supplies KIT 1 each by Does not apply route 2 (two) times daily. Dispense based on patient and insurance preference. Use twice daily as directed.   empagliflozin (JARDIANCE) 25 MG TABS tablet Take 1 tablet (25 mg total) by mouth daily before breakfast.   furosemide (LASIX) 20 MG tablet TAKE 1 TABLET BY MOUTH TWICE A DAY   glucose blood test strip Use to test blood glucose twice daily as instructed   KLOR-CON M20 20 MEQ tablet TAKE 1 TABLET BY MOUTH TWICE A DAY   Lancets MISC Use to check blood glucose twice daily   levothyroxine (SYNTHROID) 88 MCG tablet TAKE 1 TABLET BY MOUTH DAILY BEFORE BREAKFAST.   loratadine (CLARITIN) 10 MG tablet TAKE 1 TABLET BY MOUTH EVERY DAY   montelukast (SINGULAIR) 10 MG tablet TAKE 1 TABLET BY MOUTH EVERYDAY AT BEDTIME   olmesartan-hydrochlorothiazide (BENICAR HCT) 40-12.5 MG tablet TAKE 1 TABLET BY MOUTH EVERY DAY   selenium sulfide (SELSUN) 1 % LOTN Wash  Hair daily with 12 cc shampoo for 3 weeks , then twice weekly (Patient taking differently: Apply 1 application  topically 2 (two) times a week. Wash hair with 12 cc)   UNABLE TO FIND Med Name: TDAP vaccine   Vitamin D, Ergocalciferol, (DRISDOL) 1.25 MG (50000 UNIT) CAPS capsule TAKE 1 CAPSULE BY MOUTH ONE TIME PER WEEK   No facility-administered encounter medications on file as of  09/30/2023.    Allergies (verified) Patient has no known allergies.   History: Past Medical History:  Diagnosis Date   Autism    Hypertension    states under control with meds., has been on med. x 8 yr.   Morbid obesity with BMI of 45.0-49.9, adult (HCC)    Nonrestorable tooth 02/2018   x 2   Nonverbal    Sleep apnea    mild - no CPAP   Past Surgical History:  Procedure Laterality Date   CYST EXCISION Right 03/03/2002   mandible   MULTIPLE TOOTH  EXTRACTIONS  03/03/2002   #1, 16, 17, 30, 32   TOENAIL EXCISION Bilateral    great toe   TOOTH EXTRACTION Right 09/03/2014   Procedure: EXTRACTION MOLAR LOWER RIGHT;  Surgeon: Georgia Lopes, DDS;  Location: [facility] OR;  Service: Oral Surgery;  Laterality: Right;   TOOTH EXTRACTION N/A 03/06/2018   Procedure: EXTRACTION X 1;  Surgeon: Ocie Doyne, DDS;  Location: [facility];  Service: Oral Surgery;  Laterality: N/A;   TOOTH EXTRACTION N/A 12/20/2020   Procedure: DENTAL RESTORATION/EXTRACTIONS TEETH THIRTEEN AND NINETEEN.;  Surgeon: Ocie Doyne, DMD;  Location: [facility] OR;  Service: Oral Surgery;  Laterality: N/A;   Family History  Problem Relation Age of Onset   Hypertension Mother    Hypertension Father    Diabetes Father    Heart disease Maternal Grandfather    Diabetes Paternal Grandmother    Seizures Maternal Uncle    CVA Maternal Grandmother    Heart disease Paternal Grandfather    Social History   Socioeconomic History   Marital status: Single    Spouse name: N/A   Number of children: 0   Years of education: Not on file   Highest education level: Not on file  Occupational History   Not on file  Tobacco Use   Smoking status: Never   Smokeless tobacco: Never  Vaping Use   Vaping status: Never Used  Substance and Sexual Activity   Alcohol use: No   Drug use: No   Sexual activity: Never  Other Topics Concern   Not on file  Social History Narrative   Lives with Mother and sister    Social Drivers of Health   Financial Resource Strain: Low Risk  (09/30/2023)   Overall Financial Resource Strain (CARDIA)    Difficulty of Paying Living Expenses: Not hard at all  Food Insecurity: No Food Insecurity (09/30/2023)   Hunger Vital Sign    Worried About Running Out of Food in the Last Year: Never true    Ran Out of Food in the Last Year: Never true  Transportation Needs: No Transportation Needs (09/30/2023)   PRAPARE - Administrator, Civil Service  (Medical): No    Lack of Transportation (Non-Medical): No  Physical Activity: Insufficiently Active (09/30/2023)   Exercise Vital Sign    Days of Exercise per Week: 2 days    Minutes of Exercise per Session: 20 min  Stress: No Stress Concern Present (09/30/2023)   [employer] of Occupational Health - Occupational Stress Questionnaire    Feeling of Stress : Not at all  Social Connections: Socially Isolated (09/30/2023)   Social Connection and Isolation Panel [NHANES]    Frequency of Communication with Friends and Family: Never    Frequency of Social Gatherings with Friends and Family: Once a week    Attends Religious Services: More than 4 times per year    Active Member of [employer]  or Organizations: No    Attends Banker Meetings: Never    Marital Status: Never married    Tobacco Counseling Counseling given: Yes    Clinical Intake:  Pre-visit preparation completed: Yes  Pain : No/denies pain     BMI - recorded: 56.85 Nutritional Status: BMI > 30  Obese Nutritional Risks: None Diabetes: Yes CBG done?: No Did pt. bring in CBG monitor from home?: No  How often do you need to have someone help you when you read instructions, pamphlets, or other written materials from your doctor or pharmacy?: 5 - Always  Interpreter Needed?: No  Comments: non verbal, autism mother assisted with visit. Information entered by :: Maryjean Ka CMA   Activities of Daily Living     09/30/2023    2:13 PM  In your present state of health, do you have any difficulty performing the following activities:  Hearing? 0  Vision? 0  Difficulty concentrating or making decisions? 0  Walking or climbing stairs? 0  Dressing or bathing? 1  Doing errands, shopping? 1  Comment patient doesnt drive. Autism and non verbal. mother takes him  Quarry manager and eating ? Y  Using the Toilet? Y  In the past six months, have you accidently leaked urine? N  Do you have problems with loss of bowel  control? N  Managing your Medications? Y  Managing your Finances? Y  Housekeeping or managing your Housekeeping? Y    Patient Care Team: Kerri Perches, MD as PCP - General Mare Loan, RD as Dietitian (Nutrition) Fransico Him Denman George, MD as Consulting Physician (Endocrinology) Maeola Harman, MD as Consulting Physician (Vascular Surgery) McDonald, Rachelle Hora, DPM as Consulting Physician (Podiatry)  Indicate any recent Medical Services you may have received from other than [facility] providers in the past year (date may be approximate).     Assessment:   This is a routine wellness examination for Scott Mendez.  Hearing/Vision screen Hearing Screening - Comments:: Mother states patient does not have any hearing difficulties  Vision Screening - Comments:: Patient has autism and is non verbal. Unable to go to an eye doctor   Goals Addressed             This Visit's Progress    Patient Stated       Going to bed at a reasonable hour instead of 5am       Depression Screen     09/30/2023    2:17 PM 09/20/2023    3:12 PM 04/16/2023    2:49 PM 12/13/2022    3:22 PM 12/13/2022    3:20 PM 09/25/2022    3:24 PM 09/11/2022    3:34 PM  PHQ 2/9 Scores  PHQ - 2 Score  0 0 0 0 0 0  PHQ- 9 Score   0      Exception Documentation Other- indicate reason in comment box        Not completed patient is non verbal. per mother he has not seemed down or depressed within the last 2 weeks.          Fall Risk     09/30/2023    2:15 PM 09/20/2023    3:12 PM 04/16/2023    2:49 PM 12/13/2022    3:21 PM 12/13/2022    3:20 PM  Fall Risk   Falls in the past year? 0 0 0 0 0  Number falls in past yr: 0 0  0 0  Injury with Fall? 0  0  0 0  Risk for fall due to : No Fall Risks No Fall Risks  No Fall Risks No Fall Risks  Follow up Falls prevention discussed;Falls evaluation completed Falls evaluation completed   Falls evaluation completed    MEDICARE RISK AT HOME:  Medicare Risk at Home Any  stairs in or around the home?: No If so, are there any without handrails?: No Home free of loose throw rugs in walkways, pet beds, electrical cords, etc?: Yes Adequate lighting in your home to reduce risk of falls?: Yes Life alert?: No Use of a cane, walker or w/c?: No Grab bars in the bathroom?: No Shower chair or bench in shower?: No Elevated toilet seat or a handicapped toilet?: Yes  TIMED UP AND GO:  Was the test performed?  No  Cognitive Function: patient has autism and is nonverbal. Unable to complete 6CIT    09/30/2023    2:16 PM 03/13/2017    7:41 PM  MMSE - Mini Mental State Exam  Not completed: Unable to complete Unable to complete        Immunizations Immunization History  Administered Date(s) Administered   H1N1 06/03/2008   Influenza Split 04/22/2012   Influenza Whole 05/07/2007, 05/02/2009, 04/17/2010   Influenza, Seasonal, Injecte, Preservative Fre 04/16/2023   Influenza,inj,Quad PF,6+ Mos 05/18/2013, 07/20/2014, 04/04/2015, 04/04/2016, 04/10/2017, 05/05/2018, 04/20/2019, 04/13/2020, 05/11/2021, 04/05/2022   Moderna Sars-Covid-2 Vaccination 12/07/2019, 01/06/2020, 11/08/2020   Pfizer Covid-19 Vaccine Bivalent Booster 37yrs & up 10/19/2021   Td 11/23/2002   Tdap 12/19/2011    Screening Tests Health Maintenance  Topic Date Due   Pneumococcal Vaccine 95-36 Years old (1 of 2 - PCV) Never done   OPHTHALMOLOGY EXAM  Never done   DTaP/Tdap/Td (3 - Td or Tdap) 12/18/2021   COVID-19 Vaccine (5 - 2024-25 season) 04/07/2023   HEMOGLOBIN A1C  01/13/2024   Diabetic kidney evaluation - Urine ACR  04/15/2024   FOOT EXAM  04/17/2024   Diabetic kidney evaluation - eGFR measurement  07/11/2024   Medicare Annual Wellness (AWV)  09/29/2024   INFLUENZA VACCINE  Completed   Hepatitis C Screening  Completed   HIV Screening  Completed   HPV VACCINES  Aged Out    Health Maintenance  Health Maintenance Due  Topic Date Due   Pneumococcal Vaccine 2-74 Years old (1 of 2 -  PCV) Never done   OPHTHALMOLOGY EXAM  Never done   DTaP/Tdap/Td (3 - Td or Tdap) 12/18/2021   COVID-19 Vaccine (5 - 2024-25 season) 04/07/2023   Health Maintenance Items Addressed: Advised patients mom that DTap, Covid, Pneumococcal vaccines are due. Patient is unable to have an eye exam due to being nonverbal.   Additional Screening:  Vision Screening: Recommended annual ophthalmology exams for early detection of glaucoma and other disorders of the eye.  Dental Screening: Recommended annual dental exams for proper oral hygiene  Community Resource Referral / Chronic Care Management: CRR required this visit?  No   CCM required this visit?  No     Plan:     I have personally reviewed and noted the following in the patient's chart:   Medical and social history Use of alcohol, tobacco or illicit drugs  Current medications and supplements including opioid prescriptions. Patient is not currently taking opioid prescriptions. Functional ability and status Nutritional status Physical activity Advanced directives List of other physicians Hospitalizations, surgeries, and ER visits in previous 12 months Vitals Screenings to include cognitive, depression, and falls Referrals and appointments  In addition,  I have reviewed and discussed with patient certain preventive protocols, quality metrics, and best practice recommendations. A written personalized care plan for preventive services as well as general preventive health recommendations were provided to patient.     Scott Mendez, CMA   09/30/2023   After Visit Summary: (Mail) Due to this being a telephonic visit, the after visit summary with patients personalized plan was offered to patient via mail   Notes: Nothing significant to report at this time.

## 2023-10-01 ENCOUNTER — Telehealth: Payer: Self-pay | Admitting: Family Medicine

## 2023-10-01 NOTE — Telephone Encounter (Signed)
 [facility] med form  Noted Copied Sleeved (put in provider box)  Call patient mother Scott Mendez [phone] when ready

## 2023-10-11 ENCOUNTER — Telehealth: Payer: Self-pay | Admitting: Family Medicine

## 2023-10-11 NOTE — Telephone Encounter (Signed)
 Pt needs appointment in office for form completion as the medication doses on the form are not what is in his eMR record, a 20 minute focussed visit with me is fine BUT  all medication bottles , prescribed and OTC need to be brought in, the med discrepancy is CONCERNING, pls add on next week there is a day with 14 if I recall and let mother know what needs to happen, jeffrey will need to come also   I am leaving the form with the rest of the stack

## 2023-10-14 ENCOUNTER — Telehealth: Payer: Self-pay | Admitting: Family Medicine

## 2023-10-14 NOTE — Telephone Encounter (Signed)
 Copied from CRM [id number]. Topic: General - Other >> Oct 14, 2023 10:47 AM Everette C wrote: Reason for CRM: Allyne Gee has called for an update on a request for correspondence submitted to the practice for medication discontinuation   The document was originally submitted on 09/30/23  Please fax response/documents to [phone]  Please contact further if needed

## 2023-10-14 NOTE — Telephone Encounter (Signed)
 Appointment scheduled.

## 2023-10-14 NOTE — Telephone Encounter (Signed)
 FYI  Contacted patient's mother to offer appointment on 3/13 at 2:00 with PCP to discuss prescribed and OTC meds to complete form.  Requested for mother to call back and confirm details.

## 2023-10-15 NOTE — Telephone Encounter (Signed)
 The meds on the form were not the same as what we have on our end so the pt has an appt to discuss what meds he needs to be taking.

## 2023-10-17 ENCOUNTER — Encounter: Payer: Self-pay | Admitting: Family Medicine

## 2023-10-17 ENCOUNTER — Other Ambulatory Visit: Payer: Self-pay | Admitting: Family Medicine

## 2023-10-17 ENCOUNTER — Other Ambulatory Visit: Payer: Self-pay | Admitting: "Endocrinology

## 2023-10-17 ENCOUNTER — Ambulatory Visit (INDEPENDENT_AMBULATORY_CARE_PROVIDER_SITE_OTHER): Admitting: Family Medicine

## 2023-10-17 VITALS — BP 108/69 | HR 102 | Resp 16 | Ht 71.0 in | Wt 346.1 lb

## 2023-10-17 DIAGNOSIS — E559 Vitamin D deficiency, unspecified: Secondary | ICD-10-CM | POA: Diagnosis not present

## 2023-10-17 DIAGNOSIS — Z79899 Other long term (current) drug therapy: Secondary | ICD-10-CM | POA: Diagnosis not present

## 2023-10-17 DIAGNOSIS — E1159 Type 2 diabetes mellitus with other circulatory complications: Secondary | ICD-10-CM | POA: Diagnosis not present

## 2023-10-17 DIAGNOSIS — Z23 Encounter for immunization: Secondary | ICD-10-CM

## 2023-10-17 DIAGNOSIS — E039 Hypothyroidism, unspecified: Secondary | ICD-10-CM

## 2023-10-17 DIAGNOSIS — I10 Essential (primary) hypertension: Secondary | ICD-10-CM | POA: Diagnosis not present

## 2023-10-17 MED ORDER — SELENIUM SULFIDE 1 % EX LOTN: TOPICAL_LOTION | CUTANEOUS | 1 refills | Status: AC

## 2023-10-17 MED ORDER — FUROSEMIDE 20 MG PO TABS: 20.0000 mg | ORAL_TABLET | Freq: Two times a day (BID) | ORAL | 1 refills | Status: DC

## 2023-10-17 NOTE — Patient Instructions (Addendum)
 F/U as before  Form will need to be corrected by agency , before I can sign off, nurse please send printed med list to the facility  Pneumonia 20 today  Labs already ordered In February today  Thanks for choosing [facility], we consider it a privelige to serve you.

## 2023-10-18 LAB — CBC
Hematocrit: 46.4 % (ref 37.5–51.0)
Hemoglobin: 15.1 g/dL (ref 13.0–17.7)
MCH: 29.9 pg (ref 26.6–33.0)
MCHC: 32.5 g/dL (ref 31.5–35.7)
MCV: 92 fL (ref 79–97)
Platelets: 316 10*3/uL (ref 150–450)
RBC: 5.05 x10E6/uL (ref 4.14–5.80)
RDW: 12.6 % (ref 11.6–15.4)
WBC: 10 10*3/uL (ref 3.4–10.8)

## 2023-10-18 LAB — BMP8+EGFR
BUN/Creatinine Ratio: 17 (ref 9–20)
BUN: 17 mg/dL (ref 6–24)
CO2: 24 mmol/L (ref 20–29)
Calcium: 9.5 mg/dL (ref 8.7–10.2)
Chloride: 97 mmol/L (ref 96–106)
Creatinine, Ser: 1.02 mg/dL (ref 0.76–1.27)
Glucose: 108 mg/dL — ABNORMAL HIGH (ref 70–99)
Potassium: 3.9 mmol/L (ref 3.5–5.2)
Sodium: 139 mmol/L (ref 134–144)
eGFR: 95 mL/min/{1.73_m2} (ref >59)

## 2023-10-18 LAB — VITAMIN D 25 HYDROXY (VIT D DEFICIENCY, FRACTURES): Vit D, 25-Hydroxy: 41.4 ng/mL (ref 30.0–100.0)

## 2023-10-21 ENCOUNTER — Telehealth: Payer: Self-pay | Admitting: Family Medicine

## 2023-10-21 NOTE — Telephone Encounter (Signed)
 Copied from CRM [id number]. Topic: General - Other >> Oct 21, 2023 11:53 AM Everette C wrote: Reason for CRM: Bethann Berkshire with [facility] has called for an update on med orders as well as a request for an updated medication list (including discontinuations) within the past 6 months - 1 year   Bethann Berkshire shares that a pre-filled version of the document will be resubmitted to the practice indicating the information that's needed for completion  Please contact further if needed

## 2023-10-22 NOTE — Telephone Encounter (Signed)
 Faxed updated med list to [facility]

## 2023-10-23 ENCOUNTER — Encounter: Payer: Self-pay | Admitting: Family Medicine

## 2023-10-28 ENCOUNTER — Encounter: Payer: Self-pay | Admitting: Family Medicine

## 2023-10-28 DIAGNOSIS — Z79899 Other long term (current) drug therapy: Secondary | ICD-10-CM | POA: Insufficient documentation

## 2023-10-28 DIAGNOSIS — Z23 Encounter for immunization: Secondary | ICD-10-CM | POA: Insufficient documentation

## 2023-10-28 NOTE — Assessment & Plan Note (Signed)
 Controlled, no change in medication DASH diet and commitment to daily physical activity for a minimum of 30 minutes discussed and encouraged, as a part of hypertension management. The importance of attaining a healthy weight is also discussed.     10/17/2023    2:14 PM 09/30/2023    2:09 PM 09/20/2023    3:11 PM 07/15/2023    2:50 PM 07/15/2023    2:25 PM 04/16/2023    2:42 PM 04/09/2023    1:11 PM  BP/Weight  Systolic BP 108 -- 115  114 128   Diastolic BP 69 -- 80  70 86   Wt. (Lbs) 346.12 363 352.08 363 363.8 381.12 382  BMI 48.27 kg/m2 56.85 kg/m2 49.11 kg/m2 50.63 kg/m2 50.74 kg/m2 56.28 kg/m2 53.28 kg/m2

## 2023-10-28 NOTE — Assessment & Plan Note (Signed)
 Managed by Endo , controlled on medication

## 2023-10-28 NOTE — Assessment & Plan Note (Signed)
 Medications physically checked at visit and are as documented Chronic conditions are controlled and stable Correct list sent to Agency for corrected form to be submitted so I can sign off

## 2023-10-28 NOTE — Assessment & Plan Note (Signed)
  Patient re-educated about  the importance of commitment to a  minimum of 150 minutes of exercise per week as able.  The importance of healthy food choices with portion control discussed, as well as eating regularly and within a 12 hour window most days. The need to choose "clean , green" food 50 to 75% of the time is discussed, as well as to make water the primary drink and set a goal of 64 ounces water daily.       10/17/2023    2:14 PM 09/30/2023    2:09 PM 09/20/2023    3:11 PM  Weight /BMI  Weight 346 lb 1.9 oz 363 lb 352 lb 1.3 oz  Height 5\' 11"  (1.803 m) 5\' 7"  (1.702 m) 5\' 11"  (1.803 m)  BMI 48.27 kg/m2 56.85 kg/m2 49.11 kg/m2    Improved , continue restricted diet

## 2023-10-28 NOTE — Progress Notes (Signed)
 Scott Mendez     MRN: [id number]      DOB: [birth year]  Chief Complaint  Patient presents with   Medication Problem    Medication rec due to discrepancy in his medication form from [facility]     HPI Scott Mendez is here for stated reason  ROS Denies recent fever or chills. Denies sinus pressure, nasal congestion, ear pain or sore throat. Denies chest congestion, productive cough or wheezing. Denies chest pains, palpitations and leg swelling Denies abdominal pain, nausea, vomiting,diarrhea or constipation.   Denies dysuria, frequency, hesitancy or incontinence. Denies joint pain, swelling and limitation in mobility. Denies headaches, seizures, numbness, or tingling. Denies depression, anxiety or insomnia. Denies skin break down or rash.   PE  BP 108/69   Pulse (!) 102   Resp 16   Ht 5\' 11"  (1.803 m)   Wt (!) 346 lb 1.9 oz (157 kg)   SpO2 93%   BMI 48.27 kg/m   Patient alert and oriented and in no cardiopulmonary distress.  HEENT: No facial asymmetry, EOMI,     Neck supple .  Chest: Clear to auscultation bilaterally.  CVS: S1, S2 no murmurs, no S3.Regular rate.  ABD: Soft non tender.   Ext: No edema  MS: decreased  ROM spine, shoulders, hips and knees.  Skin: Intact, no ulcerations or rash noted.  Psych: Good eye contact, normal affect. Memory intact not anxious or depressed appearing.  CNS: CN 2-12 intact, power,  normal throughout.no focal deficits noted.   Assessment & Plan  Encounter for immunization After obtaining informed consent, the pneumonia 20 vaccine is  administered , with no adverse effect noted at the time of administration.   Hypothyroidism Managed by Endo , controlled on medication  Morbid obesity  Patient re-educated about  the importance of commitment to a  minimum of 150 minutes of exercise per week as able.  The importance of healthy food choices with portion control discussed, as well as eating regularly and within a 12  hour window most days. The need to choose "clean , green" food 50 to 75% of the time is discussed, as well as to make water the primary drink and set a goal of 64 ounces water daily.       10/17/2023    2:14 PM 09/30/2023    2:09 PM 09/20/2023    3:11 PM  Weight /BMI  Weight 346 lb 1.9 oz 363 lb 352 lb 1.3 oz  Height 5\' 11"  (1.803 m) 5\' 7"  (1.702 m) 5\' 11"  (1.803 m)  BMI 48.27 kg/m2 56.85 kg/m2 49.11 kg/m2    Improved , continue restricted diet  Vitamin D deficiency Corrected on current supplement , continue same  Essential hypertension, benign Controlled, no change in medication DASH diet and commitment to daily physical activity for a minimum of 30 minutes discussed and encouraged, as a part of hypertension management. The importance of attaining a healthy weight is also discussed.     10/17/2023    2:14 PM 09/30/2023    2:09 PM 09/20/2023    3:11 PM 07/15/2023    2:50 PM 07/15/2023    2:25 PM 04/16/2023    2:42 PM 04/09/2023    1:11 PM  BP/Weight  Systolic BP 108 -- 115  114 128   Diastolic BP 69 -- 80  70 86   Wt. (Lbs) 346.12 363 352.08 363 363.8 381.12 382  BMI 48.27 kg/m2 56.85 kg/m2 49.11 kg/m2 50.63 kg/m2 50.74 kg/m2 56.28 kg/m2 53.28 kg/m2  Medication management Medications physically checked at visit and are as documented Chronic conditions are controlled and stable Correct list sent to Agency for corrected form to be submitted so I can sign off

## 2023-10-28 NOTE — Assessment & Plan Note (Signed)
 Corrected on current supplement , continue same

## 2023-10-28 NOTE — Assessment & Plan Note (Signed)
 After obtaining informed consent, the pneumonia 20  vaccine is  administered , with no adverse effect noted at the time of administration.

## 2023-11-13 ENCOUNTER — Ambulatory Visit: Payer: 59 | Admitting: "Endocrinology

## 2023-11-19 ENCOUNTER — Ambulatory Visit: Payer: 59 | Admitting: "Endocrinology

## 2023-12-04 ENCOUNTER — Other Ambulatory Visit: Payer: Self-pay | Admitting: "Endocrinology

## 2023-12-11 ENCOUNTER — Other Ambulatory Visit: Payer: Self-pay | Admitting: "Endocrinology

## 2023-12-11 ENCOUNTER — Other Ambulatory Visit: Payer: Self-pay | Admitting: Family Medicine

## 2023-12-11 DIAGNOSIS — E559 Vitamin D deficiency, unspecified: Secondary | ICD-10-CM

## 2023-12-11 DIAGNOSIS — I10 Essential (primary) hypertension: Secondary | ICD-10-CM

## 2023-12-17 ENCOUNTER — Ambulatory Visit: Admitting: "Endocrinology

## 2023-12-26 ENCOUNTER — Ambulatory Visit (INDEPENDENT_AMBULATORY_CARE_PROVIDER_SITE_OTHER): Admitting: "Endocrinology

## 2023-12-26 ENCOUNTER — Telehealth: Payer: Self-pay | Admitting: Family Medicine

## 2023-12-26 ENCOUNTER — Encounter: Payer: Self-pay | Admitting: "Endocrinology

## 2023-12-26 DIAGNOSIS — I10 Essential (primary) hypertension: Secondary | ICD-10-CM | POA: Diagnosis not present

## 2023-12-26 DIAGNOSIS — E039 Hypothyroidism, unspecified: Secondary | ICD-10-CM

## 2023-12-26 DIAGNOSIS — Z6841 Body Mass Index (BMI) 40.0 and over, adult: Secondary | ICD-10-CM

## 2023-12-26 DIAGNOSIS — E785 Hyperlipidemia, unspecified: Secondary | ICD-10-CM | POA: Diagnosis not present

## 2023-12-26 DIAGNOSIS — E1169 Type 2 diabetes mellitus with other specified complication: Secondary | ICD-10-CM | POA: Diagnosis not present

## 2023-12-26 DIAGNOSIS — Z7984 Long term (current) use of oral hypoglycemic drugs: Secondary | ICD-10-CM | POA: Diagnosis not present

## 2023-12-26 LAB — POCT GLYCOSYLATED HEMOGLOBIN (HGB A1C): HbA1c, POC (controlled diabetic range): 5.5 % (ref 0.0–7.0)

## 2023-12-26 NOTE — Telephone Encounter (Signed)
 Patient mother here needs a letter stating she cannot do jury duty due to her son not being able to come with her since she is his care giver- due Monday 5/26. Please advise Thank you

## 2023-12-26 NOTE — Patient Instructions (Signed)

## 2023-12-26 NOTE — Progress Notes (Signed)
 12/26/2023, 3:42 PM  Endocrinology follow-up note   Subjective:    Patient ID: Scott Mendez , male    DOB: Feb 14, 1981.  Scott Mendez  is being seen in follow-up after he was seen in consultation for management of currently controlled asymptomatic diabetes , hypothyroidism requested by  Towanda Fret, MD.   Past Medical History:  Diagnosis Date   Autism    Hypertension    states under control with meds., has been on med. x 8 yr.   Morbid obesity with BMI of 45.0-49.9, adult (HCC)    Nonrestorable tooth 02/2018   x 2   Nonverbal    Sleep apnea    mild - no CPAP    Past Surgical History:  Procedure Laterality Date   CYST EXCISION Right 03/03/2002   mandible   MULTIPLE TOOTH EXTRACTIONS  03/03/2002   #1, 16, 17, 30, 32   TOENAIL EXCISION Bilateral    great toe   TOOTH EXTRACTION Right 09/03/2014   Procedure: EXTRACTION MOLAR LOWER RIGHT;  Surgeon: Cornelia Dieter, DDS;  Location: [facility] OR;  Service: Oral Surgery;  Laterality: Right;   TOOTH EXTRACTION N/A 03/06/2018   Procedure: EXTRACTION X 1;  Surgeon: Ascencion Lava, DDS;  Location: [facility];  Service: Oral Surgery;  Laterality: N/A;   TOOTH EXTRACTION N/A 12/20/2020   Procedure: DENTAL RESTORATION/EXTRACTIONS TEETH THIRTEEN AND NINETEEN.;  Surgeon: Ascencion Lava, DMD;  Location: [facility] OR;  Service: Oral Surgery;  Laterality: N/A;    Social History   Socioeconomic History   Marital status: Single    Spouse name: N/A   Number of children: 0   Years of education: Not on file   Highest education level: Not on file  Occupational History   Not on file  Tobacco Use   Smoking status: Never   Smokeless tobacco: Never  Vaping Use   Vaping status: Never Used  Substance and Sexual Activity   Alcohol use: No   Drug use: No   Sexual activity: Never  Other Topics Concern   Not on file  Social History Narrative   Lives with Mother and sister    Social Drivers of  Health   Financial Resource Strain: Low Risk  (09/30/2023)   Overall Financial Resource Strain (CARDIA)    Difficulty of Paying Living Expenses: Not hard at all  Food Insecurity: No Food Insecurity (09/30/2023)   Hunger Vital Sign    Worried About Running Out of Food in the Last Year: Never true    Ran Out of Food in the Last Year: Never true  Transportation Needs: No Transportation Needs (09/30/2023)   PRAPARE - Administrator, Civil Service (Medical): No    Lack of Transportation (Non-Medical): No  Physical Activity: Insufficiently Active (09/30/2023)   Exercise Vital Sign    Days of Exercise per Week: 2 days    Minutes of Exercise per Session: 20 min  Stress: No Stress Concern Present (09/30/2023)   [employer] of Occupational Health - Occupational Stress Questionnaire    Feeling of Stress : Not at all  Social Connections: Socially Isolated (09/30/2023)   Social Connection and Isolation Panel [NHANES]    Frequency of Communication with Friends and Family: Never    Frequency of Social Gatherings with Friends and Family: Once a week    Attends Religious Services: More than 4 times per year    Active Member of Clubs or Organizations: No    Attends  Banker Meetings: Never    Marital Status: Never married    Family History  Problem Relation Age of Onset   Hypertension Mother    Hypertension Father    Diabetes Father    Heart disease Maternal Grandfather    Diabetes Paternal Grandmother    Seizures Maternal Uncle    CVA Maternal Grandmother    Heart disease Paternal Grandfather     Outpatient Encounter Medications as of 12/26/2023  Medication Sig   empagliflozin  (JARDIANCE ) 25 MG TABS tablet TAKE 1 TABLET BY MOUTH DAILY BEFORE BREAKFAST.   furosemide  (LASIX ) 20 MG tablet Take 1 tablet (20 mg total) by mouth 2 (two) times daily.   levothyroxine  (SYNTHROID ) 88 MCG tablet TAKE 1 TABLET BY MOUTH DAILY BEFORE BREAKFAST.   loratadine  (CLARITIN ) 10 MG  tablet TAKE 1 TABLET BY MOUTH EVERY DAY   montelukast  (SINGULAIR ) 10 MG tablet TAKE 1 TABLET BY MOUTH EVERYDAY AT BEDTIME   olmesartan -hydrochlorothiazide  (BENICAR  HCT) 40-12.5 MG tablet TAKE 1 TABLET BY MOUTH EVERY DAY   potassium chloride  SA (KLOR-CON  M) 20 MEQ tablet TAKE 1 TABLET BY MOUTH TWICE A DAY   selenium  sulfide (SELSUN ) 1 % LOTN Wash  Hair  with 12 cc shampoo once  weekly   Vitamin D , Ergocalciferol , (DRISDOL ) 1.25 MG (50000 UNIT) CAPS capsule TAKE 1 CAPSULE BY MOUTH ONE TIME PER WEEK   No facility-administered encounter medications on file as of 12/26/2023.    ALLERGIES: No Known Allergies  VACCINATION STATUS: Immunization History  Administered Date(s) Administered   H1N1 06/03/2008   Influenza Split 04/22/2012   Influenza Whole 05/07/2007, 05/02/2009, 04/17/2010   Influenza, Seasonal, Injecte, Preservative Fre 04/16/2023   Influenza,inj,Quad PF,6+ Mos 05/18/2013, 07/20/2014, 04/04/2015, 04/04/2016, 04/10/2017, 05/05/2018, 04/20/2019, 04/13/2020, 05/11/2021, 04/05/2022   Moderna Sars-Covid-2 Vaccination 12/07/2019, 01/06/2020, 11/08/2020   PNEUMOCOCCAL CONJUGATE-20 10/17/2023   Pfizer Covid-19 Vaccine Bivalent Booster 17yrs & up 10/19/2021   Td 11/23/2002   Tdap 12/19/2011    Diabetes He presents for his follow-up diabetic visit. He has type 2 diabetes mellitus. Onset time: No diagnosis at age 57. His disease course has been improving. Hypoglycemia symptoms include confusion. Pertinent negatives for hypoglycemia include no headaches, pallor or seizures. There are no diabetic associated symptoms. Pertinent negatives for diabetes include no chest pain, no fatigue, no polydipsia, no polyphagia, no polyuria and no weakness. There are no hypoglycemic complications. Symptoms are stable. (His type 2 diabetes is complicated by developmental deficit with autism, hyperlipidemia, hypertension, obesity.) Risk factors for coronary artery disease include family history, dyslipidemia,  hypertension, male sex, obesity and sedentary lifestyle. When asked about current treatments, none were reported. His weight is decreasing steadily (Patient presents with 27 more  pounds of weight loss.). He is following a generally unhealthy diet. When asked about meal planning, he reported none. He has not had a previous visit with a dietitian. He never participates in exercise. His home blood glucose trend is decreasing steadily. (He is accompanied by his mother to clinic.  His point-of-care A1c is stable at 5.5%, presents with 27 pounds weight loss.    ) An ACE inhibitor/angiotensin II receptor blocker is being taken.  Hyperlipidemia This is a chronic problem. The current episode started more than 1 year ago. The problem is uncontrolled. Exacerbating diseases include diabetes, hypothyroidism and obesity. Pertinent negatives include no chest pain, myalgias or shortness of breath. He is currently on no antihyperlipidemic treatment. Risk factors for coronary artery disease include diabetes mellitus, dyslipidemia, hypertension, male sex, a sedentary lifestyle, family  history and obesity.  Hypertension This is a chronic problem. The current episode started more than 1 year ago. Pertinent negatives include no chest pain, headaches, neck pain, palpitations or shortness of breath. Risk factors for coronary artery disease include dyslipidemia, family history, diabetes mellitus, male gender, obesity and sedentary lifestyle. Past treatments include angiotensin blockers.       Objective:       12/26/2023    3:23 PM 10/17/2023    2:14 PM 09/30/2023    2:09 PM  Vitals with BMI  Height 5\' 11"  5\' 11"  5\' 7"   Weight 338 lbs 346 lbs 2 oz 363 lbs  BMI 47.16 48.3 56.84  Systolic 110 108 --  Diastolic 78 69 --  Pulse 76 102     BP 110/78   Pulse 76   Ht 5\' 11"  (1.803 m)   Wt (!) 338 lb (153.3 kg)   BMI 47.14 kg/m   Wt Readings from Last 3 Encounters:  12/26/23 (!) 338 lb (153.3 kg)  10/17/23 (!) 346  lb 1.9 oz (157 kg)  09/30/23 (!) 363 lb (164.7 kg)       CMP ( most recent) CMP     Component Value Date/Time   NA 139 10/17/2023 1512   K 3.9 10/17/2023 1512   CL 97 10/17/2023 1512   CO2 24 10/17/2023 1512   GLUCOSE 108 (H) 10/17/2023 1512   GLUCOSE 88 11/04/2019 0841   BUN 17 10/17/2023 1512   CREATININE 1.02 10/17/2023 1512   CREATININE 1.01 11/04/2019 0841   CALCIUM  9.5 10/17/2023 1512   PROT 7.1 07/12/2023 1052   ALBUMIN 4.5 07/12/2023 1052   AST 23 07/12/2023 1052   ALT 32 07/12/2023 1052   ALKPHOS 112 07/12/2023 1052   BILITOT 0.6 07/12/2023 1052   EGFR 95 10/17/2023 1512   GFRNONAA 95 11/04/2019 0841     Diabetic Labs (most recent): Lab Results  Component Value Date   HGBA1C 5.5 12/26/2023   HGBA1C 5.7 07/15/2023   HGBA1C 6.2 03/19/2023     Lipid Panel ( most recent) Lipid Panel     Component Value Date/Time   CHOL 141 07/12/2023 1052   TRIG 116 07/12/2023 1052   HDL 35 (L) 07/12/2023 1052   CHOLHDL 4.0 07/12/2023 1052   CHOLHDL 3.8 11/04/2019 0841   VLDL 19 09/17/2016 0834   LDLCALC 85 07/12/2023 1052   LDLCALC 92 11/04/2019 0841   LABVLDL 21 07/12/2023 1052      Lab Results  Component Value Date   TSH 1.030 07/12/2023   TSH 5.460 (H) 12/13/2022   TSH 3.110 04/13/2022   TSH 2.630 11/07/2020   TSH 1.81 11/04/2019   TSH 2.59 12/31/2018   TSH 1.85 09/09/2017   TSH 2.43 09/17/2016   TSH 1.00 03/23/2016   TSH 1.954 03/14/2015   FREET4 1.40 07/12/2023   FREET4 1.25 12/13/2022       Assessment & Plan:   1. Type 2 diabetes mellitus with morbid obesity (HCC)  - Scott Mendez  has currently uncontrolled symptomatic type 2 DM since  43 years of age. He is accompanied by his mother to clinic.  His point-of-care A1c is stable at 5.5%, presents with 27  more pounds weight loss.   Recent labs reviewed. -Patient is autistic, nonverbal.  His care plan was discussed with his mother.  -his diabetes is complicated by comorbid  hyperlipidemia, hypertension, morbid obesity, developmental deficit and he remains at a high risk for more acute and chronic complications which include  CAD, CVA, CKD, retinopathy, and neuropathy. These are all discussed in detail with him.  - I discussed all available options of managing his diabetes including de-escalation of medications. I have counseled him on Food as Medicine by adopting a Whole Food , Plant Predominant  ( WFPP) nutrition as recommended by [employer] of Lifestyle Medicine. Patient is encouraged to switch to  unprocessed or minimally processed  complex starch, adequate protein intake (mainly plant source), minimal liquid fat, plenty of fruits, and vegetables. -  he is advised to stick to a routine mealtimes to eat 3 complete meals a day and snack only when necessary ( to snack only to correct hypoglycemia BG <70 day time or <100 at night).  - he will be scheduled with Penny Crumpton, RDN, CDE for individualized diabetes education.  - I have approached him with the following individualized plan to manage  his diabetes and patient agrees:   -His point-of-care A1c is 5.5%, progressively improving from 6.6%.  He is benefiting from SGLT2 inhibitor from weight loss point of view.  I discussed and continue Jardiance  25 mg p.o. daily at breakfast.   Side effects and precautions discussed with the family. He would have benefited more from GLP-1 receptor agonist, however reportedly he gets anxious with injections. - Specific targets for  A1c;  LDL, HDL,  and Triglycerides were discussed with the patient.  2) Blood Pressure /Hypertension:   His blood pressure is controlled to target.   he is advised to continue his current medications including Benicar  HCT 40-12.5 mg p.o. daily with breakfast .  3) Lipids/Hyperlipidemia:   Review of his recent lipid panel showed improving lipid panel with LDL at 85.  He will not be initiated on statins for now.  He will be treated for  hypothyroidism-see below.   4)  Weight/Diet:  Body mass index is 47.14 kg/m.  -Patient presents with 30 pounds of weight loss.  H is high BMI is clearly complicating his diabetes care.   he is  a candidate for weight loss.  A single most important intervention for this patient would be weight loss.  If he does not respond to SGLT2 inhibitors , he will be considered for GLP-1 receptor agonist.  I discussed with him the fact that loss of 5 - 10% of his  current body weight will have the most impact on his diabetes management.  The above detailed  ACLM recommendations for nutrition, exercise, sleep, social life, avoidance of risky substances, the need for restorative sleep   information will also detailed on discharge instructions.   5) hypothyroidism:  He was initiated on levothyroxine  during his prior visits.  He is responding with adequate metabolic response.  He is advised to continue levothyroxine  88 mcg p.o. daily before breakfast.     - We discussed about the correct intake of his thyroid  hormone, on empty stomach at fasting, with water, separated by at least 30 minutes from breakfast and other medications,  and separated by more than 4 hours from calcium , iron, multivitamins, acid reflux medications (PPIs). -Patient is made aware of the fact that thyroid  hormone replacement is needed for life, dose to be adjusted by periodic monitoring of thyroid  function tests.   6) Chronic Care/Health Maintenance:  -he  is on ARB medications and  is encouraged to initiate and continue to follow up with Ophthalmology, Dentist,  Podiatrist at least yearly or according to recommendations, and advised to   stay away from smoking. I have recommended yearly flu  vaccine and pneumonia vaccine at least every 5 years; moderate intensity exercise for up to 150 minutes weekly; and  sleep for 7- 9 hours a day.  - he is  advised to maintain close follow up with Towanda Fret, MD for primary care needs, as well as  his other providers for optimal and coordinated care.  I spent  25  minutes in the care of the patient today including review of labs from Thyroid  Function, CMP, and other relevant labs ; imaging/biopsy records (current and previous including abstractions from other facilities); face-to-face time discussing  his lab results and symptoms, medications doses, his options of short and long term treatment based on the latest standards of care / guidelines;   and documenting the encounter.  Scott Mendez   participated in the discussions, expressed understanding, and voiced agreement with the above plans.  All questions were answered to his satisfaction. he is encouraged to contact clinic should he have any questions or concerns prior to his return visit.   Follow up plan: - Return in about 6 months (around 06/27/2024) for F/U with Pre-visit Labs, A1c -NV.  Scott Orf, MD [facility] [address] [place], [place] [address] Phone: [phone]  Fax: [phone]    12/26/2023, 3:42 PM  This note was partially dictated with voice recognition software. Similar sounding words can be transcribed inadequately or may not  be corrected upon review.

## 2023-12-31 NOTE — Telephone Encounter (Signed)
 Patient's mother is calling in because she requested paperwork excusing her from jury duty. She says the courts need something saying patient has Autism, which is why she requested the letter from his provider. Patient is requesting a call from the nurse.

## 2024-01-02 NOTE — Telephone Encounter (Signed)
 Letter placed up front. Tried calling, n/a, vm full

## 2024-01-22 ENCOUNTER — Ambulatory Visit (INDEPENDENT_AMBULATORY_CARE_PROVIDER_SITE_OTHER): Payer: 59 | Admitting: Family Medicine

## 2024-01-22 ENCOUNTER — Encounter: Payer: Self-pay | Admitting: Family Medicine

## 2024-01-22 VITALS — BP 102/71 | HR 92 | Resp 18 | Ht 71.0 in | Wt 336.1 lb

## 2024-01-22 DIAGNOSIS — E785 Hyperlipidemia, unspecified: Secondary | ICD-10-CM

## 2024-01-22 DIAGNOSIS — Z0001 Encounter for general adult medical examination with abnormal findings: Secondary | ICD-10-CM

## 2024-01-22 NOTE — Progress Notes (Signed)
   Scott Mendez      MRN: [id number]      DOB: 1980/09/27  Chief Complaint  Patient presents with   Annual Exam    cpe    HPI: Patient is in for annual physical exam. No other health concerns are expressed or addressed at the visit. Recent labs, if available are reviewed. Immunization is reviewed , and  updated if needed.    PE; BP 102/71   Pulse 92   Resp 18   Ht 5' 11 (1.803 m)   Wt (!) 336 lb 1.9 oz (152.5 kg)   SpO2 96%   BMI 46.88 kg/m   Pleasant male, alert , autistic, in no cardio-pulmonary distress. Afebrile. HEENT No facial trauma or asymetry. Sinuses non tender. EOMI External ears normal,  Neck: supple, no adenopathy,JVD or thyromegaly.No bruits.  Chest: Clear to ascultation bilaterally.No crackles or wheezes. Non tender to palpation  Cardiovascular system; Heart sounds normal,  S1 and  S2 ,no S3.  No murmur, or thrill. Apical beat not displaced Peripheral pulses normal.  Abdomen: Soft, non tender, no organomegaly or masses. No bruits. Bowel sounds normal. No guarding, tenderness or rebound.    Musculoskeletal exam: Full ROM of spine, hips , shoulders and knees. Knee  deformity ,swelling or crepitus noted. No muscle wasting or atrophy.   Neurologic: Cranial nerves 2 to 12 intact. Power, tone normal throughout. No disturbance in gait. No tremor.  Skin: Intact, no ulceration, erythema , scaling or rash noted. Pigmentation normal throughout    Assessment & Plan:  Encounter for Medicare annual examination with abnormal findings Annual exam as documented. Counseling done  re healthy lifestyle involving commitment to 150 minutes exercise per week, heart healthy diet, and attaining healthy weight.The importance of adequate sleep also discussed.

## 2024-01-22 NOTE — Assessment & Plan Note (Signed)
Annual exam as documented. Counseling done  re healthy lifestyle involving commitment to 150 minutes exercise per week, heart healthy diet, and attaining healthy weight.The importance of adequate sleep also discussed.  

## 2024-01-22 NOTE — Patient Instructions (Addendum)
 F/U in early Novemebr, first week, call if you need me sooner  Fasting lipid to be drawn 1 week approx before nov appt, get Dr Chrystine Crate labs same day  Keep up the great work!  Please get TdAP at your pharmacy  HAPPY 43 [address] BIRTHDAY, and many, many more!!  Thanks for choosing [facility], we consider it a privelige to serve you.

## 2024-02-29 ENCOUNTER — Other Ambulatory Visit: Payer: Self-pay | Admitting: "Endocrinology

## 2024-02-29 DIAGNOSIS — E1169 Type 2 diabetes mellitus with other specified complication: Secondary | ICD-10-CM

## 2024-03-04 ENCOUNTER — Other Ambulatory Visit: Payer: Self-pay | Admitting: "Endocrinology

## 2024-03-04 ENCOUNTER — Other Ambulatory Visit: Payer: Self-pay | Admitting: Family Medicine

## 2024-05-29 ENCOUNTER — Other Ambulatory Visit: Payer: Self-pay | Admitting: "Endocrinology

## 2024-05-29 ENCOUNTER — Other Ambulatory Visit: Payer: Self-pay | Admitting: Family Medicine

## 2024-06-09 ENCOUNTER — Other Ambulatory Visit: Payer: Self-pay | Admitting: Family Medicine

## 2024-06-09 DIAGNOSIS — E559 Vitamin D deficiency, unspecified: Secondary | ICD-10-CM

## 2024-06-10 ENCOUNTER — Encounter: Payer: Self-pay | Admitting: Family Medicine

## 2024-06-10 ENCOUNTER — Ambulatory Visit: Admitting: Family Medicine

## 2024-06-10 DIAGNOSIS — I10 Essential (primary) hypertension: Secondary | ICD-10-CM | POA: Diagnosis not present

## 2024-06-10 DIAGNOSIS — E1169 Type 2 diabetes mellitus with other specified complication: Secondary | ICD-10-CM

## 2024-06-10 DIAGNOSIS — E038 Other specified hypothyroidism: Secondary | ICD-10-CM

## 2024-06-10 DIAGNOSIS — Z23 Encounter for immunization: Secondary | ICD-10-CM

## 2024-06-10 NOTE — Progress Notes (Signed)
 Scott Mendez                                           MRN: [id number]   06/10/2024   The [facility] Quality Team Specialist reviewed this patient medical record for the purposes of chart review for care gap closure. The following were reviewed: chart review for care gap closure-kidney health evaluation for diabetes:eGFR  and uACR.    [facility] Quality Team

## 2024-06-10 NOTE — Patient Instructions (Addendum)
 F/in 6 months   Pals get covid vaccine at your pharmacy next week Foot exam and urine microalb today  Pls get labs ordered by Dr Lenis today ordered in May 2025 only, NO lipid panel needed which I had ordered   Flu vaccine today  Nurse visit in 2 weeks for HPV and hep B series, then in 2 months and 6 months  Urine microalb today  It is important that you exercise regularly at least 30 minutes 5 times a week. If you develop chest pain, have severe difficulty breathing, or feel very tired, stop exercising immediately and seek medical attention   Thanks for choosing [facility], we consider it a privelige to serve you.

## 2024-06-11 LAB — COMPREHENSIVE METABOLIC PANEL WITH GFR
ALT: 29 IU/L (ref 0–44)
AST: 22 IU/L (ref 0–40)
Albumin: 4.3 g/dL (ref 4.1–5.1)
Alkaline Phosphatase: 106 IU/L (ref 47–123)
BUN/Creatinine Ratio: 19 (ref 9–20)
BUN: 20 mg/dL (ref 6–24)
Bilirubin Total: 0.6 mg/dL (ref 0.0–1.2)
CO2: 24 mmol/L (ref 20–29)
Calcium: 9.5 mg/dL (ref 8.7–10.2)
Chloride: 101 mmol/L (ref 96–106)
Creatinine, Ser: 1.03 mg/dL (ref 0.76–1.27)
Globulin, Total: 2.5 g/dL (ref 1.5–4.5)
Glucose: 82 mg/dL (ref 70–99)
Potassium: 3.9 mmol/L (ref 3.5–5.2)
Sodium: 141 mmol/L (ref 134–144)
Total Protein: 6.8 g/dL (ref 6.0–8.5)
eGFR: 94 mL/min/1.73 (ref >59)

## 2024-06-11 LAB — TSH: TSH: 2.31 u[IU]/mL (ref 0.450–4.500)

## 2024-06-11 LAB — T4, FREE: Free T4: 1.29 ng/dL (ref 0.82–1.77)

## 2024-06-12 LAB — MICROALBUMIN / CREATININE URINE RATIO
Creatinine, Urine: 129.3 mg/dL
Microalb/Creat Ratio: <2 mg/g{creat} (ref 0–29)
Microalbumin, Urine: <3 ug/mL

## 2024-06-14 ENCOUNTER — Encounter: Payer: Self-pay | Admitting: Family Medicine

## 2024-06-14 DIAGNOSIS — Z23 Encounter for immunization: Secondary | ICD-10-CM | POA: Insufficient documentation

## 2024-06-14 NOTE — Progress Notes (Signed)
 Scott Mendez      MRN: [id number]      DOB: 1980/10/02  Chief Complaint  Patient presents with   Medical Management of Chronic Issues    5 month follow up     HPI, Mother provides history Scott Mendez  is here for follow up and re-evaluation of chronic medical conditions, medication management and review of any available recent lab and radiology data.  Preventive health is updated, specifically  Cancer screening and Immunization.   Questions or concerns regarding consultations or procedures which the PT has had in the interim are  addressed. The PT denies any adverse reactions to current medications since the last visit.  There are no new concerns.  There are no specific complaints   ROS Denies recent fever or chills. Denies sinus pressure, nasal congestion, ear pain or sore throat. Denies chest congestion, productive cough or wheezing. Denies chest pains, palpitations and leg swelling Denies abdominal pain, nausea, vomiting,diarrhea or constipation.   Denies dysuria, frequency, hesitancy or incontinence. Denies joint pain, swelling and limitation in mobility. Denies headaches, seizures, numbness, or tingling. Denies depression, anxiety or insomnia. Denies skin break down or rash.   PE  BP 114/80   Pulse 89   Resp 18   Ht 5' 11 (1.803 m)   Wt (!) 327 lb 1.9 oz (148.4 kg)   SpO2 95%   BMI 45.62 kg/m   Patient alert and in no cardiopulmonary distress.  HEENT: No facial asymmetry, EOMI,     Neck supple .  Chest: Clear to auscultation bilaterally.  CVS: S1, S2 no murmurs, no S3.Regular rate.  ABD: Soft non tender.   Ext: No edema  MS: Adequate though educed ROM spine, shoulders, hips and knees.  Skin: Intact, no ulcerations or rash noted.  Psych: Fairly Good eye contact, not anxious or depressed appearing.  CNS: CN 2-12 intact, power,  normal throughout.no focal deficits noted.   Assessment & Plan  Essential hypertension, benign Controlled, no  change in medication DASH diet and commitment to daily physical activity for a minimum of 30 minutes discussed and encouraged, as a part of hypertension management. The importance of attaining a healthy weight is also discussed.     06/10/2024    3:07 PM 01/22/2024    3:32 PM 12/26/2023    3:23 PM 10/17/2023    2:14 PM 09/30/2023    2:09 PM 09/20/2023    3:11 PM 07/15/2023    2:50 PM  BP/Weight  Systolic BP 114 102 110 108 -- 115   Diastolic BP 80 71 78 69 -- 80   Wt. (Lbs) 327.12 336.12 338 346.12 363 352.08 363  BMI 45.62 kg/m2 46.88 kg/m2 47.14 kg/m2 48.27 kg/m2 56.85 kg/m2 49.11 kg/m2 50.63 kg/m2       Hypothyroidism Managed by Endo and controlled on current med  Type 2 diabetes mellitus with morbid obesity (HCC) Diabetes associated with hypertension and obesity  Scott Mendez  is reminded of the importance of commitment to daily physical activity for 30 minutes or more, as able and the need to limit carbohydrate intake to 30 to 60 grams per meal to help with blood sugar control.   The need to take medication as prescribed, is discussed  Scott Mendez  is reminded of the importance of daily foot exam, annual eye examination, and good blood sugar, blood pressure and cholesterol control.     Latest Ref Rng & Units 06/10/2024    4:40 PM 06/10/2024    4:18 PM 12/26/2023  3:38 PM 10/17/2023    3:12 PM 07/15/2023    2:36 PM  Diabetic Labs  HbA1c 0.0 - 7.0 %   5.5   5.7   Micro/Creat Ratio 0 - 29 mg/g creat <2       Creatinine 0.76 - 1.27 mg/dL  8.96   8.97        88/11/7972    3:07 PM 01/22/2024    3:32 PM 12/26/2023    3:23 PM 10/17/2023    2:14 PM 09/30/2023    2:09 PM 09/20/2023    3:11 PM 07/15/2023    2:50 PM  BP/Weight  Systolic BP 114 102 110 108 -- 115   Diastolic BP 80 71 78 69 -- 80   Wt. (Lbs) 327.12 336.12 338 346.12 363 352.08 363  BMI 45.62 kg/m2 46.88 kg/m2 47.14 kg/m2 48.27 kg/m2 56.85 kg/m2 49.11 kg/m2 50.63 kg/m2      06/10/2024    3:20 PM  Foot/eye exam  completion dates  Foot Form Completion Done        Morbid obesity (HCC)  Patient re-educated about  the importance of commitment to a  minimum of 150 minutes of exercise per week as able.  The importance of healthy food choices with portion control discussed, as well as eating regularly and within a 12 hour window most days. The need to choose clean , green food 50 to 75% of the time is discussed, as well as to make water the primary drink and set a goal of 64 ounces water daily.       06/10/2024    3:07 PM 01/22/2024    3:32 PM 12/26/2023    3:23 PM  Weight /BMI  Weight 327 lb 1.9 oz 336 lb 1.9 oz 338 lb  Height 5' 11 (1.803 m) 5' 11 (1.803 m) 5' 11 (1.803 m)  BMI 45.62 kg/m2 46.88 kg/m2 47.14 kg/m2    improving  Influenza vaccination administered at current visit After obtaining informed consent, the vaccine is  administered , with no adverse effect noted at the time of administration.

## 2024-06-14 NOTE — Assessment & Plan Note (Signed)
  Patient re-educated about  the importance of commitment to a  minimum of 150 minutes of exercise per week as able.  The importance of healthy food choices with portion control discussed, as well as eating regularly and within a 12 hour window most days. The need to choose clean , green food 50 to 75% of the time is discussed, as well as to make water the primary drink and set a goal of 64 ounces water daily.       06/10/2024    3:07 PM 01/22/2024    3:32 PM 12/26/2023    3:23 PM  Weight /BMI  Weight 327 lb 1.9 oz 336 lb 1.9 oz 338 lb  Height 5' 11 (1.803 m) 5' 11 (1.803 m) 5' 11 (1.803 m)  BMI 45.62 kg/m2 46.88 kg/m2 47.14 kg/m2    improving

## 2024-06-14 NOTE — Assessment & Plan Note (Signed)
 After obtaining informed consent, the vaccine is  administered , with no adverse effect noted at the time of administration.

## 2024-06-14 NOTE — Assessment & Plan Note (Signed)
 Diabetes associated with hypertension and obesity  Scott Mendez  is reminded of the importance of commitment to daily physical activity for 30 minutes or more, as able and the need to limit carbohydrate intake to 30 to 60 grams per meal to help with blood sugar control.   The need to take medication as prescribed, is discussed  Scott Mendez  is reminded of the importance of daily foot exam, annual eye examination, and good blood sugar, blood pressure and cholesterol control.     Latest Ref Rng & Units 06/10/2024    4:40 PM 06/10/2024    4:18 PM 12/26/2023    3:38 PM 10/17/2023    3:12 PM 07/15/2023    2:36 PM  Diabetic Labs  HbA1c 0.0 - 7.0 %   5.5   5.7   Micro/Creat Ratio 0 - 29 mg/g creat <2       Creatinine 0.76 - 1.27 mg/dL  8.96   8.97        88/11/7972    3:07 PM 01/22/2024    3:32 PM 12/26/2023    3:23 PM 10/17/2023    2:14 PM 09/30/2023    2:09 PM 09/20/2023    3:11 PM 07/15/2023    2:50 PM  BP/Weight  Systolic BP 114 102 110 108 -- 115   Diastolic BP 80 71 78 69 -- 80   Wt. (Lbs) 327.12 336.12 338 346.12 363 352.08 363  BMI 45.62 kg/m2 46.88 kg/m2 47.14 kg/m2 48.27 kg/m2 56.85 kg/m2 49.11 kg/m2 50.63 kg/m2      06/10/2024    3:20 PM  Foot/eye exam completion dates  Foot Form Completion Done

## 2024-06-14 NOTE — Assessment & Plan Note (Signed)
 Controlled, no change in medication DASH diet and commitment to daily physical activity for a minimum of 30 minutes discussed and encouraged, as a part of hypertension management. The importance of attaining a healthy weight is also discussed.     06/10/2024    3:07 PM 01/22/2024    3:32 PM 12/26/2023    3:23 PM 10/17/2023    2:14 PM 09/30/2023    2:09 PM 09/20/2023    3:11 PM 07/15/2023    2:50 PM  BP/Weight  Systolic BP 114 102 110 108 -- 115   Diastolic BP 80 71 78 69 -- 80   Wt. (Lbs) 327.12 336.12 338 346.12 363 352.08 363  BMI 45.62 kg/m2 46.88 kg/m2 47.14 kg/m2 48.27 kg/m2 56.85 kg/m2 49.11 kg/m2 50.63 kg/m2

## 2024-06-14 NOTE — Assessment & Plan Note (Signed)
Managed by Endo and controlled on current med

## 2024-06-24 ENCOUNTER — Ambulatory Visit (INDEPENDENT_AMBULATORY_CARE_PROVIDER_SITE_OTHER)

## 2024-06-24 DIAGNOSIS — Z23 Encounter for immunization: Secondary | ICD-10-CM

## 2024-06-29 ENCOUNTER — Encounter: Payer: Self-pay | Admitting: "Endocrinology

## 2024-06-29 ENCOUNTER — Ambulatory Visit: Admitting: "Endocrinology

## 2024-06-29 DIAGNOSIS — E039 Hypothyroidism, unspecified: Secondary | ICD-10-CM | POA: Diagnosis not present

## 2024-06-29 DIAGNOSIS — E1169 Type 2 diabetes mellitus with other specified complication: Secondary | ICD-10-CM | POA: Diagnosis not present

## 2024-06-29 DIAGNOSIS — Z7984 Long term (current) use of oral hypoglycemic drugs: Secondary | ICD-10-CM

## 2024-06-29 DIAGNOSIS — I10 Essential (primary) hypertension: Secondary | ICD-10-CM

## 2024-06-29 DIAGNOSIS — Z6841 Body Mass Index (BMI) 40.0 and over, adult: Secondary | ICD-10-CM

## 2024-06-29 LAB — POCT GLYCOSYLATED HEMOGLOBIN (HGB A1C)

## 2024-06-29 NOTE — Progress Notes (Signed)
 06/29/2024, 3:41 PM  Endocrinology follow-up note   Subjective:    Patient ID: Scott Mendez , male    DOB: Sep 29, 1980.  Scott Mendez  is being seen in follow-up after he was seen in consultation for management of currently controlled asymptomatic diabetes , hypothyroidism requested by  Antonetta Rollene BRAVO, MD.   Past Medical History:  Diagnosis Date   Autism    Hypertension    states under control with meds., has been on med. x 8 yr.   Morbid obesity with BMI of 45.0-49.9, adult (HCC)    Nonrestorable tooth 02/2018   x 2   Nonverbal    Sleep apnea    mild - no CPAP    Past Surgical History:  Procedure Laterality Date   CYST EXCISION Right 03/03/2002   mandible   MULTIPLE TOOTH EXTRACTIONS  03/03/2002   #1, 16, 17, 30, 32   TOENAIL EXCISION Bilateral    great toe   TOOTH EXTRACTION Right 09/03/2014   Procedure: EXTRACTION MOLAR LOWER RIGHT;  Surgeon: Glendia CHRISTELLA Primrose, DDS;  Location: [facility] OR;  Service: Oral Surgery;  Laterality: Right;   TOOTH EXTRACTION N/A 03/06/2018   Procedure: EXTRACTION X 1;  Surgeon: Primrose Glendia, DDS;  Location: [facility];  Service: Oral Surgery;  Laterality: N/A;   TOOTH EXTRACTION N/A 12/20/2020   Procedure: DENTAL RESTORATION/EXTRACTIONS TEETH THIRTEEN AND NINETEEN.;  Surgeon: Primrose Glendia, DMD;  Location: [facility] OR;  Service: Oral Surgery;  Laterality: N/A;    Social History   Socioeconomic History   Marital status: Single    Spouse name: N/A   Number of children: 0   Years of education: Not on file   Highest education level: Not on file  Occupational History   Not on file  Tobacco Use   Smoking status: Never   Smokeless tobacco: Never  Vaping Use   Vaping status: Never Used  Substance and Sexual Activity   Alcohol use: No   Drug use: No   Sexual activity: Never  Other Topics Concern   Not on file  Social History Narrative   Lives with Mother and sister    Social Drivers of  Health   Financial Resource Strain: Low Risk  (09/30/2023)   Overall Financial Resource Strain (CARDIA)    Difficulty of Paying Living Expenses: Not hard at all  Food Insecurity: No Food Insecurity (09/30/2023)   Hunger Vital Sign    Worried About Running Out of Food in the Last Year: Never true    Ran Out of Food in the Last Year: Never true  Transportation Needs: No Transportation Needs (09/30/2023)   PRAPARE - Administrator, Civil Service (Medical): No    Lack of Transportation (Non-Medical): No  Physical Activity: Insufficiently Active (09/30/2023)   Exercise Vital Sign    Days of Exercise per Week: 2 days    Minutes of Exercise per Session: 20 min  Stress: No Stress Concern Present (09/30/2023)   [employer] of Occupational Health - Occupational Stress Questionnaire    Feeling of Stress : Not at all  Social Connections: Socially Isolated (09/30/2023)   Social Connection and Isolation Panel    Frequency of Communication with Friends and Family: Never    Frequency of Social Gatherings with Friends and Family: Once a week    Attends Religious Services: More than 4 times per year    Active Member of [employer] or Organizations: No    Attends [employer]  or Organization Meetings: Never    Marital Status: Never married    Family History  Problem Relation Age of Onset   Hypertension Mother    Hypertension Father    Diabetes Father    Heart disease Maternal Grandfather    Diabetes Paternal Grandmother    Seizures Maternal Uncle    CVA Maternal Grandmother    Heart disease Paternal Grandfather     Outpatient Encounter Medications as of 06/29/2024  Medication Sig   furosemide  (LASIX ) 20 MG tablet Take 1 tablet (20 mg total) by mouth 2 (two) times daily.   JARDIANCE  25 MG TABS tablet TAKE 1 TABLET BY MOUTH DAILY BEFORE BREAKFAST.   levothyroxine  (SYNTHROID ) 88 MCG tablet TAKE 1 TABLET BY MOUTH DAILY BEFORE BREAKFAST.   loratadine  (CLARITIN ) 10 MG tablet TAKE 1 TABLET BY MOUTH  EVERY DAY   montelukast  (SINGULAIR ) 10 MG tablet TAKE 1 TABLET BY MOUTH EVERYDAY AT BEDTIME   olmesartan -hydrochlorothiazide  (BENICAR  HCT) 40-12.5 MG tablet TAKE 1 TABLET BY MOUTH EVERY DAY   potassium chloride  SA (KLOR-CON  M) 20 MEQ tablet TAKE 1 TABLET BY MOUTH TWICE A DAY   selenium  sulfide (SELSUN ) 1 % LOTN Wash  Hair  with 12 cc shampoo once  weekly   Vitamin D , Ergocalciferol , (DRISDOL ) 1.25 MG (50000 UNIT) CAPS capsule TAKE 1 CAPSULE BY MOUTH ONE TIME PER WEEK   No facility-administered encounter medications on file as of 06/29/2024.    ALLERGIES: No Known Allergies  VACCINATION STATUS: Immunization History  Administered Date(s) Administered   H1N1 06/03/2008   HPV 9-valent 06/24/2024   Hepb-cpg 06/24/2024   Influenza Split 04/22/2012   Influenza Whole 05/07/2007, 05/02/2009, 04/17/2010   Influenza, Seasonal, Injecte, Preservative Fre 04/16/2023, 06/10/2024   Influenza,inj,Quad PF,6+ Mos 05/18/2013, 07/20/2014, 04/04/2015, 04/04/2016, 04/10/2017, 05/05/2018, 04/20/2019, 04/13/2020, 05/11/2021, 04/05/2022   Moderna Sars-Covid-2 Vaccination 12/07/2019, 01/06/2020, 11/08/2020   PNEUMOCOCCAL CONJUGATE-20 10/17/2023   Pfizer Covid-19 Vaccine Bivalent Booster 64yrs & up 10/19/2021   Td 11/23/2002, 01/22/2024   Tdap 12/19/2011    Diabetes He presents for his follow-up diabetic visit. He has type 2 diabetes mellitus. Onset time: No diagnosis at age 44. His disease course has been improving. Hypoglycemia symptoms include confusion. Pertinent negatives for hypoglycemia include no headaches, pallor or seizures. There are no diabetic associated symptoms. Pertinent negatives for diabetes include no chest pain, no fatigue, no polydipsia, no polyphagia, no polyuria and no weakness. There are no hypoglycemic complications. Symptoms are stable. (His type 2 diabetes is complicated by developmental deficit with autism, hyperlipidemia, hypertension, obesity.) Risk factors for coronary artery  disease include family history, dyslipidemia, hypertension, male sex, obesity and sedentary lifestyle. When asked about current treatments, none were reported. His weight is decreasing steadily (Patient presents with 34 more  pounds of weight loss.). He is following a generally unhealthy diet. When asked about meal planning, he reported none. He has not had a previous visit with a dietitian. He never participates in exercise. His home blood glucose trend is fluctuating minimally. (He is accompanied by his mother to clinic.  His point-of-care A1c is 5.8%.  Presents with 34 pounds of weight loss overall. ) An ACE inhibitor/angiotensin II receptor blocker is being taken.  Hyperlipidemia This is a chronic problem. The current episode started more than 1 year ago. The problem is uncontrolled. Exacerbating diseases include diabetes, hypothyroidism and obesity. Pertinent negatives include no chest pain, myalgias or shortness of breath. He is currently on no antihyperlipidemic treatment. Risk factors for coronary artery disease include diabetes mellitus, dyslipidemia,  hypertension, male sex, a sedentary lifestyle, family history and obesity.  Hypertension This is a chronic problem. The current episode started more than 1 year ago. Pertinent negatives include no chest pain, headaches, neck pain, palpitations or shortness of breath. Risk factors for coronary artery disease include dyslipidemia, family history, diabetes mellitus, male gender, obesity and sedentary lifestyle. Past treatments include angiotensin blockers.    Objective:       06/29/2024    3:25 PM 06/10/2024    3:07 PM 01/22/2024    3:32 PM  Vitals with BMI  Height 5' 11 5' 11 5' 11  Weight 329 lbs 3 oz 327 lbs 2 oz 336 lbs 2 oz  BMI 45.93 45.64 46.9  Systolic 100 114 897  Diastolic 54 80 71  Pulse 92 89 92    BP (!) 100/54   Pulse 92   Ht 5' 11 (1.803 m)   Wt (!) 329 lb 3.2 oz (149.3 kg)   BMI 45.91 kg/m   Wt Readings from Last 3  Encounters:  06/29/24 (!) 329 lb 3.2 oz (149.3 kg)  06/10/24 (!) 327 lb 1.9 oz (148.4 kg)  01/22/24 (!) 336 lb 1.9 oz (152.5 kg)       CMP ( most recent) CMP     Component Value Date/Time   NA 141 06/10/2024 1618   K 3.9 06/10/2024 1618   CL 101 06/10/2024 1618   CO2 24 06/10/2024 1618   GLUCOSE 82 06/10/2024 1618   GLUCOSE 88 11/04/2019 0841   BUN 20 06/10/2024 1618   CREATININE 1.03 06/10/2024 1618   CREATININE 1.01 11/04/2019 0841   CALCIUM  9.5 06/10/2024 1618   PROT 6.8 06/10/2024 1618   ALBUMIN 4.3 06/10/2024 1618   AST 22 06/10/2024 1618   ALT 29 06/10/2024 1618   ALKPHOS 106 06/10/2024 1618   BILITOT 0.6 06/10/2024 1618   EGFR 94 06/10/2024 1618   GFRNONAA 95 11/04/2019 0841     Diabetic Labs (most recent): Lab Results  Component Value Date   HGBA1C 5.5 12/26/2023   HGBA1C 5.7 07/15/2023   HGBA1C 6.2 03/19/2023     Lipid Panel ( most recent) Lipid Panel     Component Value Date/Time   CHOL 141 07/12/2023 1052   TRIG 116 07/12/2023 1052   HDL 35 (L) 07/12/2023 1052   CHOLHDL 4.0 07/12/2023 1052   CHOLHDL 3.8 11/04/2019 0841   VLDL 19 09/17/2016 0834   LDLCALC 85 07/12/2023 1052   LDLCALC 92 11/04/2019 0841   LABVLDL 21 07/12/2023 1052      Lab Results  Component Value Date   TSH 2.310 06/10/2024   TSH 1.030 07/12/2023   TSH 5.460 (H) 12/13/2022   TSH 3.110 04/13/2022   TSH 2.630 11/07/2020   TSH 1.81 11/04/2019   TSH 2.59 12/31/2018   TSH 1.85 09/09/2017   TSH 2.43 09/17/2016   TSH 1.00 03/23/2016   FREET4 1.29 06/10/2024   FREET4 1.40 07/12/2023   FREET4 1.25 12/13/2022       Assessment & Plan:   1. Type 2 diabetes mellitus with morbid obesity (HCC)  - Scott Mendez  has currently uncontrolled symptomatic type 2 DM since  43 years of age.  He is accompanied by his mother to clinic.  His point-of-care A1c is 5.8%.  Presents with 34 pounds of weight loss overall.  Recent labs reviewed. -Patient is autistic, nonverbal.   His care plan was discussed with his mother.  -his diabetes is complicated by comorbid hyperlipidemia, hypertension, morbid  obesity, developmental deficit and he remains at a high risk for more acute and chronic complications which include CAD, CVA, CKD, retinopathy, and neuropathy. These are all discussed in detail with him.  - I discussed all available options of managing his diabetes including de-escalation of medications. I have counseled him on Food as Medicine by adopting a Whole Food , Plant Predominant  ( WFPP) nutrition as recommended by [employer] of Lifestyle Medicine. Patient is encouraged to switch to  unprocessed or minimally processed  complex starch, adequate protein intake (mainly plant source), minimal liquid fat, plenty of fruits, and vegetables. -  he is advised to stick to a routine mealtimes to eat 3 complete meals a day and snack only when necessary ( to snack only to correct hypoglycemia BG <70 day time or <100 at night).  - he will be scheduled with Penny Crumpton, RDN, CDE for individualized diabetes education.  - I have approached him with the following individualized plan to manage  his diabetes and patient agrees:   -His point-of-care A1c is 5.8%, progressively improving from 6.6%.  He is benefiting from SGLT2 inhibitor from weight loss point of view.  I advised the family to keep Jardiance  25 mg p.o. daily at breakfast.  Side effects and precautions discussed with the family. He would have benefited more from GLP-1 receptor agonist, however reportedly he gets anxious with injections. - Specific targets for  A1c;  LDL, HDL,  and Triglycerides were discussed with the patient.  2) Blood Pressure /Hypertension:   -His blood pressure is controlled to target.  he is advised to continue his current medications including Benicar  HCT 40-12.5 mg p.o. daily with breakfast .  3) Lipids/Hyperlipidemia:   Review of his recent lipid panel showed improving lipid panel with LDL at  85.  He will not be initiated on statins for now.  He will be treated for hypothyroidism-see below.   4)  Weight/Diet:  Body mass index is 45.91 kg/m.  -Patient presents with 30 pounds of weight loss.  H is high BMI is clearly complicating his diabetes care.   he is  a candidate for weight loss.  A single most important intervention for this patient would be weight loss.  If he does not respond to SGLT2 inhibitors , he will be considered for GLP-1 receptor agonist.  I discussed with him the fact that loss of 5 - 10% of his  current body weight will have the most impact on his diabetes management.  The above detailed  ACLM recommendations for nutrition, exercise, sleep, social life, avoidance of risky substances, the need for restorative sleep   information will also detailed on discharge instructions.   5) hypothyroidism:  His previsit thyroid  function tests are consistent with appropriate replacement.  He is advised to continue levothyroxine  88 mcg p.o. daily before breakfast.      - We discussed about the correct intake of his thyroid  hormone, on empty stomach at fasting, with water, separated by at least 30 minutes from breakfast and other medications,  and separated by more than 4 hours from calcium , iron, multivitamins, acid reflux medications (PPIs). -Patient is made aware of the fact that thyroid  hormone replacement is needed for life, dose to be adjusted by periodic monitoring of thyroid  function tests.    6) Chronic Care/Health Maintenance:  -he  is on ARB medications and  is encouraged to initiate and continue to follow up with Ophthalmology, Dentist,  Podiatrist at least yearly or according to recommendations, and  advised to   stay away from smoking. I have recommended yearly flu vaccine and pneumonia vaccine at least every 5 years; moderate intensity exercise for up to 150 minutes weekly; and  sleep for 7- 9 hours a day.  - he is  advised to maintain close follow up with Antonetta Rollene BRAVO, MD for primary care needs, as well as his other providers for optimal and coordinated care.   I spent  25  minutes in the care of the patient today including review of labs from Thyroid  Function, CMP, and other relevant labs ; imaging/biopsy records (current and previous including abstractions from other facilities); face-to-face time discussing  his lab results and symptoms, medications doses, his options of short and long term treatment based on the latest standards of care / guidelines;   and documenting the encounter.  Scott Mendez   participated in the discussions, expressed understanding, and voiced agreement with the above plans.  All questions were answered to his satisfaction. he is encouraged to contact clinic should he have any questions or concerns prior to his return visit.   Follow up plan: - Return in about 6 months (around 12/27/2024) for Fasting Labs  in AM B4 8, A1c -NV.  Ranny Earl, MD [facility] [address] [place], [place] [address] Phone: [phone]  Fax: [phone]    06/29/2024, 3:41 PM  This note was partially dictated with voice recognition software. Similar sounding words can be transcribed inadequately or may not  be corrected upon review.

## 2024-08-10 NOTE — Progress Notes (Unsigned)
 Scott Mendez                                           MRN: [id number]   08/10/2024   The [facility]BCI Quality Team Specialist reviewed this patient medical record for the purposes of chart review for care gap closure. The following were reviewed: {CHL AMB VBCI QUALITY SPECIALIST REASON FOR REVIEW:[id number]}.    VBCI Quality Team

## 2024-08-26 ENCOUNTER — Other Ambulatory Visit: Payer: Self-pay | Admitting: Family Medicine

## 2024-08-26 ENCOUNTER — Other Ambulatory Visit: Payer: Self-pay | Admitting: "Endocrinology

## 2024-08-26 DIAGNOSIS — I10 Essential (primary) hypertension: Secondary | ICD-10-CM

## 2024-09-04 NOTE — Progress Notes (Signed)
 Scott Mendez                                           MRN: [id number]   09/04/2024   The [facility]BCI Quality Team Specialist reviewed this patient medical record for the purposes of chart review for care gap closure. The following were reviewed: chart review for care gap closure-glycemic status assessment.    VBCI Quality Team

## 2024-09-30 ENCOUNTER — Ambulatory Visit: Payer: 59

## 2024-10-30 ENCOUNTER — Ambulatory Visit

## 2024-12-08 ENCOUNTER — Ambulatory Visit: Admitting: Family Medicine

## 2024-12-29 ENCOUNTER — Ambulatory Visit: Admitting: "Endocrinology
